# Patient Record
Sex: Female | Born: 1950 | Race: White | Hispanic: No | Marital: Married
Health system: Midwestern US, Academic
[De-identification: ages and names within clinical notes are randomized; demographics above are authoritative.]

## PROBLEM LIST (undated history)

## (undated) DIAGNOSIS — H9190 Unspecified hearing loss, unspecified ear: Secondary | ICD-10-CM

## (undated) DIAGNOSIS — C4491 Basal cell carcinoma of skin, unspecified: Secondary | ICD-10-CM

## (undated) DIAGNOSIS — Z87898 Personal history of other specified conditions: Secondary | ICD-10-CM

## (undated) DIAGNOSIS — I10 Essential (primary) hypertension: Secondary | ICD-10-CM

## (undated) DIAGNOSIS — J342 Deviated nasal septum: Secondary | ICD-10-CM

## (undated) DIAGNOSIS — K219 Gastro-esophageal reflux disease without esophagitis: Secondary | ICD-10-CM

## (undated) DIAGNOSIS — I2510 Atherosclerotic heart disease of native coronary artery without angina pectoris: Secondary | ICD-10-CM

## (undated) DIAGNOSIS — M4800 Spinal stenosis, site unspecified: Secondary | ICD-10-CM

## (undated) DIAGNOSIS — M1990 Unspecified osteoarthritis, unspecified site: Secondary | ICD-10-CM

## (undated) DIAGNOSIS — H9312 Tinnitus, left ear: Secondary | ICD-10-CM

## (undated) DIAGNOSIS — Z9289 Personal history of other medical treatment: Secondary | ICD-10-CM

## (undated) DIAGNOSIS — K589 Irritable bowel syndrome without diarrhea: Secondary | ICD-10-CM

## (undated) DIAGNOSIS — H905 Unspecified sensorineural hearing loss: Secondary | ICD-10-CM

## (undated) DIAGNOSIS — L309 Dermatitis, unspecified: Secondary | ICD-10-CM

## (undated) DIAGNOSIS — M519 Unspecified thoracic, thoracolumbar and lumbosacral intervertebral disc disorder: Secondary | ICD-10-CM

## (undated) DIAGNOSIS — E785 Hyperlipidemia, unspecified: Secondary | ICD-10-CM

## (undated) DIAGNOSIS — R931 Abnormal findings on diagnostic imaging of heart and coronary circulation: Secondary | ICD-10-CM

## (undated) DIAGNOSIS — M8580 Other specified disorders of bone density and structure, unspecified site: Secondary | ICD-10-CM

## (undated) DIAGNOSIS — H269 Unspecified cataract: Secondary | ICD-10-CM

## (undated) DIAGNOSIS — E7800 Pure hypercholesterolemia, unspecified: Secondary | ICD-10-CM

## (undated) DIAGNOSIS — D509 Iron deficiency anemia, unspecified: Secondary | ICD-10-CM

## (undated) DIAGNOSIS — Z79899 Other long term (current) drug therapy: Secondary | ICD-10-CM

## (undated) DIAGNOSIS — E559 Vitamin D deficiency, unspecified: Secondary | ICD-10-CM

## (undated) HISTORY — DX: Unspecified osteoarthritis, unspecified site: M19.90

## (undated) HISTORY — PX: ENDOMETRIAL BIOPSY: SHX622

## (undated) HISTORY — PX: EYE SURGERY: SHX253

## (undated) HISTORY — DX: Personal history of other medical treatment: Z92.89

## (undated) HISTORY — DX: Gastro-esophageal reflux disease without esophagitis: K21.9

## (undated) HISTORY — DX: Personal history of other specified conditions: Z87.898

## (undated) HISTORY — DX: Other specified disorders of bone density and structure, unspecified site: M85.80

## (undated) HISTORY — DX: Unspecified cataract: H26.9

## (undated) HISTORY — DX: Basal cell carcinoma of skin, unspecified: C44.91

## (undated) HISTORY — DX: Unspecified hearing loss, unspecified ear: H91.90

## (undated) HISTORY — PX: MOHS SURGERY: SUR867

## (undated) HISTORY — DX: Other long term (current) drug therapy: Z79.899

## (undated) HISTORY — DX: Hyperlipidemia, unspecified: E78.5

## (undated) HISTORY — DX: Tinnitus, left ear: H93.12

## (undated) HISTORY — DX: Deviated nasal septum: J34.2

## (undated) HISTORY — DX: Pure hypercholesterolemia, unspecified: E78.00

## (undated) HISTORY — DX: Dermatitis, unspecified: L30.9

## (undated) HISTORY — DX: Unspecified thoracic, thoracolumbar and lumbosacral intervertebral disc disorder: M51.9

## (undated) HISTORY — DX: Unspecified sensorineural hearing loss: H90.5

## (undated) HISTORY — DX: Spinal stenosis, site unspecified: M48.00

## (undated) HISTORY — DX: Abnormal findings on diagnostic imaging of heart and coronary circulation: R93.1

## (undated) HISTORY — DX: Atherosclerotic heart disease of native coronary artery without angina pectoris: I25.10

## (undated) HISTORY — DX: Irritable bowel syndrome, unspecified: K58.9

## (undated) HISTORY — DX: Essential (primary) hypertension: I10

## (undated) HISTORY — DX: Vitamin D deficiency, unspecified: E55.9

## (undated) HISTORY — DX: Iron deficiency anemia, unspecified: D50.9

## (undated) HISTORY — PX: TONSILLECTOMY: SUR1361

---

## 2013-01-02 LAB — HM COLONOSCOPY

## 2013-11-04 DIAGNOSIS — Z9289 Personal history of other medical treatment: Secondary | ICD-10-CM

## 2013-11-04 HISTORY — DX: Personal history of other medical treatment: Z92.89

## 2016-11-22 LAB — COMPREHENSIVE METABOLIC PANEL
Albumin: 4 (ref 3.5–5.0)
Calcium: 9.3 (ref 8.7–10.7)
GFR calc Af Amer: 86
GFR calc non Af Amer: 72

## 2016-11-22 LAB — BASIC METABOLIC PANEL
BUN: 19 (ref 4–21)
CO2: 27 — AB (ref 13–22)
Chloride: 105 (ref 99–108)
Creatinine: 0.8 (ref 0.5–1.1)
Glucose: 86
Potassium: 4.3 (ref 3.4–5.3)

## 2016-11-22 LAB — IRON,TIBC AND FERRITIN PANEL
Ferritin: 46
Iron: 94

## 2016-11-22 LAB — TSH: TSH: 2.04 (ref 0.41–5.90)

## 2016-11-22 LAB — CBC AND DIFFERENTIAL
HCT: 42 (ref 36–46)
Hemoglobin: 13.7 (ref 12.0–16.0)
Platelets: 245 (ref 150–399)
WBC: 5.1

## 2016-11-22 LAB — CBC: RBC: 4.7 (ref 3.87–5.11)

## 2016-11-22 LAB — HEMOGLOBIN A1C: Hemoglobin A1C: 5.3

## 2016-11-22 LAB — HEPATIC FUNCTION PANEL
Alkaline Phosphatase: 68 (ref 25–125)
Bilirubin, Total: 0.7

## 2016-11-22 LAB — LIPID PANEL
Cholesterol: 253 — AB (ref 0–200)
HDL: 169 — AB (ref 35–70)
LDL Cholesterol: 77
Triglycerides: 37 — AB (ref 40–160)

## 2017-09-30 ENCOUNTER — Ambulatory Visit: Admit: 2017-09-30 | Discharge: 2017-10-01 | Payer: MEDICARE

## 2017-09-30 ENCOUNTER — Encounter: Admit: 2017-09-30 | Discharge: 2017-09-30 | Payer: MEDICARE

## 2017-09-30 DIAGNOSIS — C801 Malignant (primary) neoplasm, unspecified: ICD-10-CM

## 2017-09-30 DIAGNOSIS — L309 Dermatitis, unspecified: ICD-10-CM

## 2017-09-30 DIAGNOSIS — E7800 Pure hypercholesterolemia, unspecified: ICD-10-CM

## 2017-09-30 DIAGNOSIS — H04123 Dry eye syndrome of bilateral lacrimal glands: ICD-10-CM

## 2017-09-30 DIAGNOSIS — L853 Xerosis cutis: ICD-10-CM

## 2017-09-30 DIAGNOSIS — H509 Unspecified strabismus: ICD-10-CM

## 2017-09-30 DIAGNOSIS — H269 Unspecified cataract: ICD-10-CM

## 2017-09-30 DIAGNOSIS — H2513 Age-related nuclear cataract, bilateral: ICD-10-CM

## 2017-09-30 DIAGNOSIS — H04129 Dry eye syndrome of unspecified lacrimal gland: ICD-10-CM

## 2017-09-30 DIAGNOSIS — R42 Dizziness and giddiness: Principal | ICD-10-CM

## 2017-09-30 DIAGNOSIS — H35371 Puckering of macula, right eye: Principal | ICD-10-CM

## 2017-09-30 NOTE — Progress Notes
Stacey Drake is a 66 y.o. female that had concerns including Follow Up (6 month 03/10/2017 follow up for dry eye and tear film insufficiency of both eyes.); Medication Update (Systane bid if I remember.); Vision Change (She notes no changes in her vision in either e ye.); Spots and/or Floaters (She notes her floaters are unchanged except one day where "her house was invaded by bugs"); and No Complaint (She confirms that she has no redness pain itching discharge flashing lights or light sensitivity in either eye.).      HPI, Assessment and Plan: The primary encounter diagnosis was Epiretinal membrane (ERM) of right eye. Diagnoses of Nuclear sclerosis of both eyes and Dry eye syndrome of both eyes were also pertinent to this visit. Doing well she feels va about same SD-OCT, ERM od stable, Having some problem w more spot, floaters but comes and goes, husband having problem w macular pucker, [facility].  Nuclear sclerosis slwoly worsening but va stable cont to monitor reck 6 mo.

## 2018-01-29 LAB — LIPID PANEL
Cholesterol: 241 — AB (ref 0–200)
HDL: 152 — AB (ref 35–70)
LDL Cholesterol: 79
Triglycerides: 52 (ref 40–160)

## 2018-01-29 LAB — HEPATIC FUNCTION PANEL
Alkaline Phosphatase: 69 (ref 25–125)
Bilirubin, Total: 0.4

## 2018-01-29 LAB — BASIC METABOLIC PANEL
BUN: 16 (ref 4–21)
CO2: 28 — AB (ref 13–22)
Chloride: 105 (ref 99–108)
Creatinine: 0.7 (ref 0.5–1.1)
Glucose: 91
Potassium: 4.3 (ref 3.4–5.3)
Sodium: 140 (ref 137–147)

## 2018-01-29 LAB — CBC AND DIFFERENTIAL
HCT: 41 (ref 36–46)
Hemoglobin: 13.5 (ref 12.0–16.0)
Platelets: 227 (ref 150–399)
WBC: 5.4

## 2018-01-29 LAB — VITAMIN B12: Vitamin B-12: 377

## 2018-01-29 LAB — TSH: TSH: 2 (ref 0.41–5.90)

## 2018-01-29 LAB — IRON,TIBC AND FERRITIN PANEL
Ferritin: 44
Iron: 80

## 2018-01-29 LAB — COMPREHENSIVE METABOLIC PANEL
Albumin: 3.8 (ref 3.5–5.0)
Calcium: 9.5 (ref 8.7–10.7)
GFR calc Af Amer: 100
GFR calc non Af Amer: 83

## 2018-01-29 LAB — CBC: RBC: 4.6 (ref 3.87–5.11)

## 2018-01-29 LAB — HEMOGLOBIN A1C: Hemoglobin A1C: 5.3

## 2018-01-29 LAB — VITAMIN D 25 HYDROXY (VIT D DEFICIENCY, FRACTURES): Vit D, 25-Hydroxy: 24

## 2018-04-02 ENCOUNTER — Encounter: Admit: 2018-04-02 | Discharge: 2018-04-02 | Payer: MEDICARE

## 2018-04-02 ENCOUNTER — Ambulatory Visit: Admit: 2018-04-02 | Discharge: 2018-04-02 | Payer: MEDICARE

## 2018-04-02 DIAGNOSIS — H2513 Age-related nuclear cataract, bilateral: ICD-10-CM

## 2018-04-02 DIAGNOSIS — E7800 Pure hypercholesterolemia, unspecified: ICD-10-CM

## 2018-04-02 DIAGNOSIS — C801 Malignant (primary) neoplasm, unspecified: ICD-10-CM

## 2018-04-02 DIAGNOSIS — R42 Dizziness and giddiness: Principal | ICD-10-CM

## 2018-04-02 DIAGNOSIS — H35371 Puckering of macula, right eye: Principal | ICD-10-CM

## 2018-04-02 DIAGNOSIS — H04123 Dry eye syndrome of bilateral lacrimal glands: ICD-10-CM

## 2018-04-02 DIAGNOSIS — L853 Xerosis cutis: ICD-10-CM

## 2018-04-02 DIAGNOSIS — H04129 Dry eye syndrome of unspecified lacrimal gland: ICD-10-CM

## 2018-04-02 DIAGNOSIS — H509 Unspecified strabismus: ICD-10-CM

## 2018-04-02 DIAGNOSIS — H269 Unspecified cataract: ICD-10-CM

## 2018-04-02 DIAGNOSIS — L309 Dermatitis, unspecified: ICD-10-CM

## 2018-04-02 DIAGNOSIS — M25641 Stiffness of right hand, not elsewhere classified: ICD-10-CM

## 2018-11-03 ENCOUNTER — Encounter: Admit: 2018-11-03 | Discharge: 2018-11-03 | Payer: MEDICARE

## 2018-11-03 ENCOUNTER — Ambulatory Visit: Admit: 2018-11-03 | Discharge: 2018-11-04 | Payer: MEDICARE

## 2018-11-03 DIAGNOSIS — H509 Unspecified strabismus: ICD-10-CM

## 2018-11-03 DIAGNOSIS — E7800 Pure hypercholesterolemia, unspecified: ICD-10-CM

## 2018-11-03 DIAGNOSIS — H04129 Dry eye syndrome of unspecified lacrimal gland: ICD-10-CM

## 2018-11-03 DIAGNOSIS — H269 Unspecified cataract: ICD-10-CM

## 2018-11-03 DIAGNOSIS — M25641 Stiffness of right hand, not elsewhere classified: ICD-10-CM

## 2018-11-03 DIAGNOSIS — C801 Malignant (primary) neoplasm, unspecified: ICD-10-CM

## 2018-11-03 DIAGNOSIS — R42 Dizziness and giddiness: Principal | ICD-10-CM

## 2018-11-03 DIAGNOSIS — L853 Xerosis cutis: ICD-10-CM

## 2018-11-03 DIAGNOSIS — L309 Dermatitis, unspecified: ICD-10-CM

## 2018-11-04 DIAGNOSIS — H35371 Puckering of macula, right eye: Principal | ICD-10-CM

## 2018-12-31 ENCOUNTER — Encounter: Admit: 2018-12-31 | Discharge: 2018-12-31 | Payer: MEDICARE

## 2019-06-08 LAB — CBC: RBC: 4.55 (ref 3.87–5.11)

## 2019-06-08 LAB — LIPID PANEL
Cholesterol: 213 — AB (ref 0–200)
HDL: 140 — AB (ref 35–70)
LDL Cholesterol: 61
Triglycerides: 59 (ref 40–160)

## 2019-06-08 LAB — IRON,TIBC AND FERRITIN PANEL: Ferritin: 90

## 2019-06-08 LAB — HEMOGLOBIN A1C: Hemoglobin A1C: 5.1

## 2019-06-08 LAB — CBC AND DIFFERENTIAL
HCT: 40 (ref 36–46)
Hemoglobin: 13.4 (ref 12.0–16.0)
Platelets: 236 (ref 150–399)
WBC: 6

## 2019-06-08 LAB — COMPREHENSIVE METABOLIC PANEL
Albumin: 4 (ref 3.5–5.0)
Calcium: 9.9 (ref 8.7–10.7)
GFR calc Af Amer: 85
GFR calc non Af Amer: 71

## 2019-06-08 LAB — HEPATIC FUNCTION PANEL
Alkaline Phosphatase: 84 (ref 25–125)
Bilirubin, Total: 0.8

## 2019-06-08 LAB — BASIC METABOLIC PANEL
BUN: 9 (ref 4–21)
CO2: 28 — AB (ref 13–22)
Chloride: 102 (ref 99–108)
Creatinine: 0.8 (ref 0.5–1.1)
Glucose: 112
Potassium: 4.3 (ref 3.4–5.3)
Sodium: 136 — AB (ref 137–147)

## 2019-06-08 LAB — TSH: TSH: 1.91 (ref <=5.90)

## 2019-06-08 LAB — VITAMIN B12: Vitamin B-12: 917

## 2019-11-27 ENCOUNTER — Encounter: Admit: 2019-11-27 | Discharge: 2019-11-27 | Payer: MEDICARE

## 2019-11-27 ENCOUNTER — Ambulatory Visit: Admit: 2019-11-27 | Discharge: 2019-11-28 | Payer: MEDICARE

## 2019-11-27 MED ORDER — COVID-19 VACC,MRNA(PFIZER)(PF) 30 MCG/0.3 ML IM SUSR
30 ug | Freq: Once | INTRAMUSCULAR | 0 refills | Status: CP
Start: 2019-11-27 — End: ?

## 2019-12-22 ENCOUNTER — Encounter: Admit: 2019-12-22 | Discharge: 2019-12-22 | Payer: MEDICARE

## 2019-12-22 ENCOUNTER — Ambulatory Visit: Admit: 2019-12-22 | Discharge: 2019-12-23 | Payer: MEDICARE

## 2019-12-22 MED ORDER — COVID-19 VACC,MRNA(PFIZER)(PF) 30 MCG/0.3 ML IM SUSR
30 ug | Freq: Once | INTRAMUSCULAR | 0 refills | Status: CP
Start: 2019-12-22 — End: ?

## 2020-01-13 ENCOUNTER — Encounter: Admit: 2020-01-13 | Discharge: 2020-01-13 | Payer: MEDICARE

## 2020-01-13 ENCOUNTER — Ambulatory Visit: Admit: 2020-01-13 | Discharge: 2020-01-13 | Payer: MEDICARE

## 2020-01-13 NOTE — Progress Notes
Stacey Drake is a 69 y.o. female that had concerns including Eye Problem (Pt reports no changes in vision as far as she can tell. Just gets occ blurred vision when she is reading ), Vision Change (Pt has been using a couple pair of glasses when she reads to find the best clarity.  ), and Treatment (Systane a few times a week ).      HPI, Assessment and Plan: The primary encounter diagnosis was Dry eye syndrome of both eyes. Diagnoses of Epiretinal membrane (ERM) of right eye and Nuclear sclerosis of both eyes were also pertinent to this visit. Doing well, cannot tell difference between last 2 pair of glasses,  No complaints. SSD-OCT improved and nuclear sclerosis slowly worsening so cont to monitr reck 9 mo

## 2020-09-15 ENCOUNTER — Other Ambulatory Visit: Payer: Self-pay

## 2020-09-15 ENCOUNTER — Encounter: Payer: Self-pay | Admitting: Orthopedic Surgery

## 2020-09-15 ENCOUNTER — Ambulatory Visit (INDEPENDENT_AMBULATORY_CARE_PROVIDER_SITE_OTHER): Payer: Medicare Other | Admitting: Orthopedic Surgery

## 2020-09-15 VITALS — BP 130/60 | HR 57 | Temp 97.3°F | Resp 20 | Ht 63.0 in | Wt 118.8 lb

## 2020-09-15 DIAGNOSIS — K2100 Gastro-esophageal reflux disease with esophagitis, without bleeding: Secondary | ICD-10-CM | POA: Diagnosis not present

## 2020-09-15 DIAGNOSIS — H9313 Tinnitus, bilateral: Secondary | ICD-10-CM | POA: Insufficient documentation

## 2020-09-15 DIAGNOSIS — Z124 Encounter for screening for malignant neoplasm of cervix: Secondary | ICD-10-CM

## 2020-09-15 DIAGNOSIS — Z85828 Personal history of other malignant neoplasm of skin: Secondary | ICD-10-CM

## 2020-09-15 DIAGNOSIS — Z1231 Encounter for screening mammogram for malignant neoplasm of breast: Secondary | ICD-10-CM

## 2020-09-15 DIAGNOSIS — M48061 Spinal stenosis, lumbar region without neurogenic claudication: Secondary | ICD-10-CM | POA: Diagnosis not present

## 2020-09-15 DIAGNOSIS — H259 Unspecified age-related cataract: Secondary | ICD-10-CM

## 2020-09-15 DIAGNOSIS — I10 Essential (primary) hypertension: Secondary | ICD-10-CM | POA: Diagnosis not present

## 2020-09-15 DIAGNOSIS — H9113 Presbycusis, bilateral: Secondary | ICD-10-CM | POA: Insufficient documentation

## 2020-09-15 DIAGNOSIS — H35373 Puckering of macula, bilateral: Secondary | ICD-10-CM | POA: Insufficient documentation

## 2020-09-15 DIAGNOSIS — E7800 Pure hypercholesterolemia, unspecified: Secondary | ICD-10-CM | POA: Insufficient documentation

## 2020-09-15 DIAGNOSIS — E2839 Other primary ovarian failure: Secondary | ICD-10-CM | POA: Insufficient documentation

## 2020-09-15 DIAGNOSIS — R55 Syncope and collapse: Secondary | ICD-10-CM

## 2020-09-15 NOTE — Progress Notes (Signed)
Careteam: No care team member to display  Seen by: Windell Moulding, AGNP-C  PLACE OF SERVICE:  [place] Directive information    No Known Allergies  No chief complaint on file.    HPI: Patient is a 69 y.o. female seen today to establish care with [facility].   She moved to [place] from [place] in July 2021 to be closer daughter and granddaughter. Adjusting to her new life here.Upset she is not driving as much and feels less independent. Scared of getting lost. Thinks roads in [place] are very confusing. Also expresses she misses her friends back in [place].    Dr. Palma Holter was her previous PCP. Last follow up was about 4 months prior.   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 Has not worked since 2002.   HTN- Does not know exactly when diagnoses was. Believes it was in her 16's. Takes lisinopril daily. Limits salt in her diet, does not add. Denies headaches or blurred vision. Does not check her blood pressure at home.   Has had high cholesterol for a very long time. Her mother had it as well. Takes atorvastatin daily. Avoids fried or fatty foods. Denies muscle aches and dark urine.   Spinal Stenosis- initially began in her 40's as bulging discs- was advised to lose weight and exercise. Ongoing back pain has subsided, but states her back sometimes feels stiff. Does not take medicine for pain. Uses icy/hot instead.   Neck pain- seen by Dr. Hedy Camara in [place] in 2020, she had increased neck pain without numbness or tingling radiating to her arms. She completed x-rays of thoracic and cervical spine, MRI of cervical was also done. Advised to start PT. Has since completed PT and pain has not returned. Denies daily neck pain at this time.   GERD at night- believes it started in 2015 due to stressors in her life. Takes pepcid daily at night. States Pepcid is effective. Does not have food triggers, but thinks her high coffe consumption  might be the cause.   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  Right hearing worse than left. Has been using bilateral hearing aids since 2001.  Tinnitus- began in her 99's. states it is chronic, but she has just blocked it out. Claims it becomes white noise in her head.   Fainting- she has had episodes of fainting in the past. Has been told it is caused by her vagal response. Thinks it is triggered by food and drink. Alcohol is a trigger. Has come close to fainting, but will lay flat and claims it will go away. No additional injuries have been caused. Has seen a cardiologist in the past and had a exercise stress test and echocardiogram completed in 2015.   Basal cell carcinoma- located on her face. Had Mohs surgery to remove cancer. Wears sunscreen if outside for long periods. Does not report any additional issues with her skin except dryness.   No history of recent falls, accidents or hospitalizations.   Appetite- drinks two large mugs daily in AM, and then another in the afternoon. Only drinks about 2 glasses of water daily. Lost 30 pounds in last year due to diet consisting of fish and vegetables.   Sleeps about 7 hrs a night.   Incontinence- not leaking urine, but c/o urinary frequency.  Urinates once a night.   Colonoscopy- Done around age 63. Claims it was a clean exam. No  family history of colon cancer.  Dexa scan- Claims she was osteopenic. Takes vitamin D1000 daily. Requesting to schedule new one.  Mamogram- aunt had bresat cancer. Still gets yearly mammograms. Does not perform monthly self exams. Denies any changes with her breasts. Requesting to schedule one.   Pap smear- wants gynecology referral.   Eye exam- Has a history of macular pucker in both eyes. Wears bifocal glasses. Still has trouble reading at times. Last seen by eye doc sometime in 2021. Trouble seeing at night- does not drive at night. Cataracts in both eyes.  Last dental exam- Last dental exam  in July 2021.         Review of Systems:  Review of Systems  Constitutional: Negative for weight loss.  HENT: Positive for hearing loss and tinnitus. Negative for sore throat.        Hearing aids  Eyes: Positive for blurred vision.       Glasses. Dry eyes.   Respiratory: Negative for shortness of breath and wheezing.   Cardiovascular: Negative for chest pain, palpitations and leg swelling.  Gastrointestinal: Positive for heartburn. Negative for abdominal pain, constipation, diarrhea and nausea.  Genitourinary: Positive for frequency. Negative for dysuria and hematuria.  Musculoskeletal: Positive for back pain.       Neck pain  Neurological: Negative for dizziness, weakness and headaches.       Fainting  Psychiatric/Behavioral: Positive for depression. The patient is nervous/anxious.     Past Medical History:  Diagnosis Date  . Acid reflux   . H/O bone density study 2019-2015  . H/O echocardiogram 2015  . H/O exercise stress test 3/4-5 2015  . Hearing loss   . High blood pressure   . High cholesterol   . Hx of mammogram 2018-2017-2016-2015-2014  . Spinal stenosis    Past Surgical History:  Procedure Laterality Date  . EYE SURGERY    . TONSILLECTOMY     Social History:   reports that she has never smoked. She has never used smokeless tobacco. She reports current alcohol use. She reports that she does not use drugs.  Family History  Problem Relation Age of Onset  . Stroke Mother 95    Medications: Patient's Medications  New Prescriptions   No medications on file  Previous Medications   ASPIRIN EC 81 MG TABLET    Take 81 mg by mouth every Monday, Wednesday, and Friday. Swallow whole.   ATORVASTATIN (LIPITOR) 20 MG TABLET    Take 20 mg by mouth daily.   CHOLECALCIFEROL (VITAMIN D) 25 MCG (1000 UNIT) TABLET    Take 1,000 Units by mouth as needed.   FAMOTIDINE (PEPCID) 20 MG TABLET    Take 20 mg by mouth daily.   LISINOPRIL (ZESTRIL) 10 MG TABLET    Take 10 mg by  mouth daily.   VITAMIN B-12 (CYANOCOBALAMIN) 1000 MCG TABLET    Take 1,000 mcg by mouth as needed.  Modified Medications   No medications on file  Discontinued Medications   No medications on file    Physical Exam:  There were no vitals filed for this visit. There is no height or weight on file to calculate BMI. Wt Readings from Last 3 Encounters:  No data found for Wt    Physical Exam Vitals reviewed.  Constitutional:      Appearance: Normal appearance. She is normal weight.  HENT:     Head: Normocephalic.     Right Ear: Tympanic membrane normal. There is no impacted cerumen.  Left Ear: Tympanic membrane normal. There is no impacted cerumen.     Mouth/Throat:     Mouth: Mucous membranes are moist.     Pharynx: No posterior oropharyngeal erythema.  Eyes:     Extraocular Movements: Extraocular movements intact.     Pupils: Pupils are equal, round, and reactive to light.  Neck:     Thyroid: No thyroid mass or thyromegaly.  Cardiovascular:     Rate and Rhythm: Normal rate and regular rhythm.     Pulses: Normal pulses.     Heart sounds: Normal heart sounds. No murmur heard.   Pulmonary:     Effort: Pulmonary effort is normal. No respiratory distress.     Breath sounds: Normal breath sounds.  Abdominal:     General: Abdomen is flat. Bowel sounds are normal.     Palpations: Abdomen is soft.  Musculoskeletal:        General: Normal range of motion.     Cervical back: Normal range of motion and neck supple.     Right lower leg: No edema.     Left lower leg: No edema.  Lymphadenopathy:     Cervical: No cervical adenopathy.  Skin:    General: Skin is warm and dry.     Capillary Refill: Capillary refill takes less than 2 seconds.  Neurological:     General: No focal deficit present.     Mental Status: She is alert and oriented to person, place, and time. Mental status is at baseline.  Psychiatric:        Mood and Affect: Mood normal.        Behavior: Behavior normal.         Thought Content: Thought content normal.        Judgment: Judgment normal.     Labs reviewed: Basic Metabolic Panel: No results for input(s): NA, K, CL, CO2, GLUCOSE, BUN, CREATININE, CALCIUM, MG, PHOS, TSH in the last 8760 hours. Liver Function Tests: No results for input(s): AST, ALT, ALKPHOS, BILITOT, PROT, ALBUMIN in the last 8760 hours. No results for input(s): LIPASE, AMYLASE in the last 8760 hours. No results for input(s): AMMONIA in the last 8760 hours. CBC: No results for input(s): WBC, NEUTROABS, HGB, HCT, MCV, PLT in the last 8760 hours. Lipid Panel: No results for input(s): CHOL, HDL, LDLCALC, TRIG, CHOLHDL, LDLDIRECT in the last 8760 hours. TSH: No results for input(s): TSH in the last 8760 hours. A1C: No results found for: HGBA1C   Assessment/Plan 1. Essential (primary) hypertension - stable, bp at goal <150/90 - continue current medication regimen - continue to limit salt in diet - cbc/diff- future - cmp- future - tsh- future  2. Pure hypercholesterolemia - stable with medication - lipid panel- future - hepatic panel- futre - continue to follow low fat diet that limits food high in fat and fried foods  3. Spinal stenosis 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 -stable at this time, denies back pain at this time - continue to use icy/hot as needed -recommend OTC tylenol for pain if needed  4. Gastroesophageal reflux disease with esophagitis without hemorrhage - stable with nightly Pepcid - suggest reducing high amounts of coffee in current diet and switching to water  5. Presbycusis of both ears - stable with use of bilateral hearing aids - may consider f/u with audiologist on future   6. Tinnitus of both ears - stable at this time, she has learned to tune it out - advised to contact PCP if it  starts to affect daily living or sleep, or causes depression  7. Estrogen deficiency - awaiting results from previous DEXA study -  recommended increasing vitamin D to 2000 units/daily - recommend adding calcium 600 mg daily since diet is limited in dairy - continue to walk daily - DG Bone Density; Future  8. Encounter for screening mammogram for malignant neoplasm of breast - awaiting results from previous mammogram - MM Digital Screening; Future  9. Pap smear for cervical cancer screening - Ambulatory referral to Gynecology  10. Age-related cataract of both eyes, unspecified age-related cataract type - stable at this time but beginning to have difficulty seeing at night - Ambulatory referral to Ophthalmology  11. Macular pucker, bilateral - stable at this time - Ambulatory referral to Ophthalmology  12. History of basal cell carcinoma - resolved, denies any skin issues except dryness at this time  13. 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

## 2020-09-15 NOTE — Patient Instructions (Signed)
Referral to gynecologist and opthalmologic  Referral to have mammogram and bone denisty

## 2020-09-18 ENCOUNTER — Encounter: Payer: Self-pay | Admitting: Nurse Practitioner

## 2020-09-19 LAB — MAGNESIUM
Magnesium: 2.2
Magnesium: 2.3

## 2020-09-26 ENCOUNTER — Other Ambulatory Visit: Payer: Self-pay

## 2020-09-26 DIAGNOSIS — E7800 Pure hypercholesterolemia, unspecified: Secondary | ICD-10-CM

## 2020-09-26 DIAGNOSIS — I10 Essential (primary) hypertension: Secondary | ICD-10-CM

## 2020-10-06 ENCOUNTER — Telehealth: Payer: Self-pay

## 2020-10-06 NOTE — Telephone Encounter (Signed)
I have been trying to get in contact with the patients last PCP to get information not included in her medical records I spoke with someone on 09/16/2020 and they transferred me to the patients PCP's nurses voice mail and she and I have been playing phone tag back and forth missing each other for about 2 days so I left her a message telling her the hours of our office and when I could be reached and I had also left her a detailed message for the one day I would be leaving early and that she could ask for [place] our offices team Lead who I told about this call I was expecting I asked her is she could take this call for me that day and I told her the information we needed if she would have called but she called and just left a message on my line instead

## 2020-10-10 ENCOUNTER — Other Ambulatory Visit: Payer: Medicare Other

## 2020-10-10 ENCOUNTER — Other Ambulatory Visit: Payer: Self-pay

## 2020-10-10 DIAGNOSIS — E7800 Pure hypercholesterolemia, unspecified: Secondary | ICD-10-CM

## 2020-10-10 DIAGNOSIS — I10 Essential (primary) hypertension: Secondary | ICD-10-CM

## 2020-10-11 LAB — CBC WITH DIFFERENTIAL/PLATELET
Absolute Monocytes: 238 cells/uL (ref 200–950)
Basophils Absolute: 22 cells/uL (ref 0–200)
Basophils Relative: 0.6 %
Eosinophils Absolute: 50 cells/uL (ref 15–500)
Eosinophils Relative: 1.4 %
HCT: 38.2 % (ref 35.0–45.0)
Hemoglobin: 12.8 g/dL (ref 11.7–15.5)
Lymphs Abs: 1120 cells/uL (ref 850–3900)
MCH: 29.6 pg (ref 27.0–33.0)
MCHC: 33.5 g/dL (ref 32.0–36.0)
MCV: 88.4 fL (ref 80.0–100.0)
MPV: 11 fL (ref 7.5–12.5)
Monocytes Relative: 6.6 %
Neutro Abs: 2171 cells/uL (ref 1500–7800)
Neutrophils Relative %: 60.3 %
Platelets: 221 10*3/uL (ref 140–400)
RBC: 4.32 10*6/uL (ref 3.80–5.10)
RDW: 12.9 % (ref 11.0–15.0)
Total Lymphocyte: 31.1 %
WBC: 3.6 10*3/uL — ABNORMAL LOW (ref 3.8–10.8)

## 2020-10-11 LAB — COMPLETE METABOLIC PANEL WITH GFR
AG Ratio: 2.3 (calc) (ref 1.0–2.5)
ALT: 22 U/L (ref 6–29)
AST: 16 U/L (ref 10–35)
Albumin: 4.3 g/dL (ref 3.6–5.1)
Alkaline phosphatase (APISO): 53 U/L (ref 37–153)
BUN: 17 mg/dL (ref 7–25)
CO2: 28 mmol/L (ref 20–32)
Calcium: 9 mg/dL (ref 8.6–10.4)
Chloride: 106 mmol/L (ref 98–110)
Creat: 0.82 mg/dL (ref 0.60–0.93)
GFR, Est African American: 84 mL/min/{1.73_m2} (ref >=60)
GFR, Est Non African American: 72 mL/min/{1.73_m2} (ref >=60)
Globulin: 1.9 g/dL (calc) (ref 1.9–3.7)
Glucose, Bld: 85 mg/dL (ref 65–99)
Potassium: 4.3 mmol/L (ref 3.5–5.3)
Sodium: 141 mmol/L (ref 135–146)
Total Bilirubin: 0.6 mg/dL (ref 0.2–1.2)
Total Protein: 6.2 g/dL (ref 6.1–8.1)

## 2020-10-11 LAB — HEPATIC FUNCTION PANEL
AG Ratio: 2.3 (calc) (ref 1.0–2.5)
ALT: 22 U/L (ref 6–29)
AST: 16 U/L (ref 10–35)
Albumin: 4.3 g/dL (ref 3.6–5.1)
Alkaline phosphatase (APISO): 53 U/L (ref 37–153)
Bilirubin, Direct: 0.1 mg/dL (ref 0.0–0.2)
Globulin: 1.9 g/dL (calc) (ref 1.9–3.7)
Indirect Bilirubin: 0.5 mg/dL (calc) (ref 0.2–1.2)
Total Bilirubin: 0.6 mg/dL (ref 0.2–1.2)
Total Protein: 6.2 g/dL (ref 6.1–8.1)

## 2020-10-11 LAB — LIPID PANEL
Cholesterol: 267 mg/dL — ABNORMAL HIGH (ref <200)
HDL: 137 mg/dL (ref >=50)
LDL Cholesterol (Calc): 118 mg/dL (calc) — ABNORMAL HIGH
Non-HDL Cholesterol (Calc): 130 mg/dL (calc) — ABNORMAL HIGH (ref <130)
Total CHOL/HDL Ratio: 1.9 (calc) (ref <5.0)
Triglycerides: 39 mg/dL (ref <150)

## 2020-10-11 LAB — TSH: TSH: 1.99 mIU/L (ref 0.40–4.50)

## 2020-10-13 ENCOUNTER — Ambulatory Visit (INDEPENDENT_AMBULATORY_CARE_PROVIDER_SITE_OTHER): Payer: Medicare Other | Admitting: Orthopedic Surgery

## 2020-10-13 ENCOUNTER — Encounter: Payer: Self-pay | Admitting: Orthopedic Surgery

## 2020-10-13 ENCOUNTER — Other Ambulatory Visit: Payer: Self-pay

## 2020-10-13 VITALS — BP 100/70 | HR 56 | Temp 97.0°F | Resp 20 | Ht 63.0 in | Wt 119.4 lb

## 2020-10-13 DIAGNOSIS — M25511 Pain in right shoulder: Secondary | ICD-10-CM

## 2020-10-13 DIAGNOSIS — K219 Gastro-esophageal reflux disease without esophagitis: Secondary | ICD-10-CM

## 2020-10-13 DIAGNOSIS — K2100 Gastro-esophageal reflux disease with esophagitis, without bleeding: Secondary | ICD-10-CM

## 2020-10-13 DIAGNOSIS — E7800 Pure hypercholesterolemia, unspecified: Secondary | ICD-10-CM

## 2020-10-13 DIAGNOSIS — Z1231 Encounter for screening mammogram for malignant neoplasm of breast: Secondary | ICD-10-CM

## 2020-10-13 DIAGNOSIS — I10 Essential (primary) hypertension: Secondary | ICD-10-CM | POA: Diagnosis not present

## 2020-10-13 MED ORDER — FAMOTIDINE 20 MG PO TABS: 20.0000 mg | ORAL_TABLET | Freq: Every day | ORAL | 1 refills | Status: DC

## 2020-10-13 MED ORDER — ATORVASTATIN CALCIUM 20 MG PO TABS: 20.0000 mg | ORAL_TABLET | Freq: Every day | ORAL | 1 refills | Status: DC

## 2020-10-13 MED ORDER — LISINOPRIL 10 MG PO TABS: 10.0000 mg | ORAL_TABLET | Freq: Every day | ORAL | 1 refills | Status: DC

## 2020-10-13 NOTE — Progress Notes (Signed)
Careteam: Patient Care Team: Yvonna Alanis, NP as PCP - General (Adult Health Nurse Practitioner)  Seen by: Windell Moulding, AGNP-C  PLACE OF SERVICE:  [place] Directive information    No Known Allergies  Chief Complaint  Patient presents with   Annual Exam    Annual Exam   Quality Metric Gaps    Hep-C Screening,    Immunizations    T-Dap     HPI: Patient is a 69 y.o. female seen today for physical exam and management of chronic conditions.   Lab results discussed with patient. Her cholesterol remians high on statin. She reduced her dose in half about a year ago. Claims her diet is rich in cheese and butter. Interested in dietary modifications before increasing statin dosage. Would like to come back to have lipid panel checked in three months after changing her diet.   10 years ago she had a cardioscan in [place]. She was recently notified by phone about scheduling the next one. Asking if she should receive another one.   Has a long history of neck and back pain. Recently saw a physical therapist for neck pain. She has not started exercises. Today, she states her neck is pain free, but she has random pain that shoots from her right shoulder to her elbow. Pain rated 5/10. Episodes of pain vary daily. She will sit in her hot tub at home to help with pain. Not taking any OTC medication for pain.   Denies any recent episodes of acid reflux. Still taking Pepcid daily.   She is still walking 3 miles with her husband daily.   No recent falls, injuries or hospitalizations.   She has not received a phone call to schedule her mammogram. She would like to speak with referral coordinator.   Requesting refills on medications.       Review of Systems:  Review of Systems  Constitutional: Negative for fever, malaise/fatigue and weight loss.  HENT: Positive for tinnitus. Negative for congestion and sore throat.        Bilateral hearing aids  Eyes: Negative for  photophobia.  Respiratory: Negative for cough, shortness of breath and wheezing.   Cardiovascular: Negative for chest pain and leg swelling.  Gastrointestinal: Positive for heartburn. Negative for abdominal pain, blood in stool and constipation.  Genitourinary: Negative for dysuria, frequency and hematuria.  Musculoskeletal: Positive for back pain, joint pain and neck pain.       Right arm pain  Skin:       Dry skin  Neurological: Positive for headaches. Negative for weakness.  Psychiatric/Behavioral: Negative for depression. The patient is not nervous/anxious and does not have insomnia.     Past Medical History:  Diagnosis Date   Acid reflux    Agatston CAC score 100-199    Coronary atherosclerosis    Deviated nasal septum    Eczema    Gastroesophageal reflux disease    H/O bone density study 2019-2015   H/O echocardiogram 2015   H/O exercise stress test 3/4-5 2015   Hearing loss    High blood pressure    High cholesterol    History of chest pain    History of prediabetes    Hx of mammogram 2018-2017-2016-2015-2014   Hyperlipidemia    IBS (irritable bowel syndrome)    Intervertebral disc disease    Iron deficiency anemia    On statin therapy    Osteoarthritis    Osteopenia    unspecified  Location   SNHL (sensorineural hearing loss)    Spinal stenosis    Tinnitus of left ear    Vitamin D deficiency    Past Surgical History:  Procedure Laterality Date   EYE SURGERY     TONSILLECTOMY     Social History:   reports that she has never smoked. She has never used smokeless tobacco. She reports current alcohol use. She reports that she does not use drugs.  Family History  Problem Relation Age of Onset   Stroke Mother 61    Medications: Patient's Medications  New Prescriptions   No medications on file  Previous Medications   ASPIRIN EC 81 MG TABLET    Take 81 mg by mouth every Monday, Wednesday, and Friday. Swallow whole.   ATORVASTATIN (LIPITOR) 20 MG TABLET     Take 20 mg by mouth daily.   CHOLECALCIFEROL (VITAMIN D) 25 MCG (1000 UNIT) TABLET    Take 1,000 Units by mouth as needed.   FAMOTIDINE (PEPCID) 20 MG TABLET    Take 20 mg by mouth daily.   LISINOPRIL (ZESTRIL) 10 MG TABLET    Take 10 mg by mouth daily.   VITAMIN B-12 (CYANOCOBALAMIN) 1000 MCG TABLET    Take 1,000 mcg by mouth as needed.  Modified Medications   No medications on file  Discontinued Medications   No medications on file    Physical Exam:  Vitals:   10/13/20 0858  BP: 100/70  Pulse: (!) 56  Resp: 20  Temp: (!) 97 F (36.1 C)  TempSrc: Temporal  SpO2: 99%  Weight: 119 lb 6.4 oz (54.2 kg)  Height: 5\' 3"  (1.6 m)   Body mass index is 21.15 kg/m. Wt Readings from Last 3 Encounters:  10/13/20 119 lb 6.4 oz (54.2 kg)  09/15/20 118 lb 12.8 oz (53.9 kg)    Physical Exam Vitals reviewed.  Constitutional:      General: She is not in acute distress.    Appearance: Normal appearance. She is normal weight. She is not ill-appearing.  HENT:     Head: Normocephalic.     Nose: Nose normal.     Mouth/Throat:     Mouth: Mucous membranes are moist.     Pharynx: No posterior oropharyngeal erythema.  Eyes:     General:        Right eye: No discharge.        Left eye: No discharge.     Extraocular Movements: Extraocular movements intact.     Pupils: Pupils are equal, round, and reactive to light.  Neck:     Thyroid: No thyroid mass, thyromegaly or thyroid tenderness.  Cardiovascular:     Rate and Rhythm: Normal rate and regular rhythm.     Pulses: Normal pulses.     Heart sounds: Normal heart sounds. No murmur heard.   Pulmonary:     Effort: Pulmonary effort is normal. No respiratory distress.     Breath sounds: Normal breath sounds. No wheezing.  Chest:  Breasts:     Right: Normal. No mass, skin change or tenderness.     Left: Normal. No mass, skin change or tenderness.    Abdominal:     General: Abdomen is flat. Bowel sounds are normal. There is no  distension.     Palpations: Abdomen is soft.     Tenderness: There is no abdominal tenderness.  Musculoskeletal:     Right shoulder: No swelling. Normal range of motion. Normal strength.     Left shoulder:  No swelling. Normal range of motion. Normal strength.     Cervical back: Normal range of motion. No swelling, rigidity or tenderness. Normal range of motion.     Thoracic back: No swelling or tenderness.     Lumbar back: No swelling or tenderness.     Right lower leg: No edema.     Left lower leg: No edema.  Lymphadenopathy:     Cervical: No cervical adenopathy.  Skin:    General: Skin is warm and dry.     Capillary Refill: Capillary refill takes less than 2 seconds.  Neurological:     General: No focal deficit present.     Mental Status: She is alert and oriented to person, place, and time. Mental status is at baseline.     Sensory: Sensation is intact.     Coordination: Coordination is intact.     Gait: Gait is intact.     Deep Tendon Reflexes: Reflexes are normal and symmetric.  Psychiatric:        Mood and Affect: Mood normal.        Behavior: Behavior normal.        Thought Content: Thought content normal.        Judgment: Judgment normal.     Labs reviewed: Basic Metabolic Panel: Recent Labs    10/10/20 0843  NA 141  K 4.3  CL 106  CO2 28  GLUCOSE 85  BUN 17  CREATININE 0.82  CALCIUM 9.0  TSH 1.99   Liver Function Tests: Recent Labs    10/10/20 0843  AST 16  16  ALT 22  22  BILITOT 0.6  0.6  PROT 6.2  6.2   No results for input(s): LIPASE, AMYLASE in the last 8760 hours. No results for input(s): AMMONIA in the last 8760 hours. CBC: Recent Labs    10/10/20 0843  WBC 3.6*  NEUTROABS 2,171  HGB 12.8  HCT 38.2  MCV 88.4  PLT 221   Lipid Panel: Recent Labs    10/10/20 0843  CHOL 267*  HDL 137  LDLCALC 118*  TRIG 39  CHOLHDL 1.9   TSH: Recent Labs    10/10/20 0843  TSH 1.99   A1C: Lab Results  Component Value Date   HGBA1C  5.1 06/08/2019     Assessment/Plan 1. Essential (primary) hypertension - EKG 12-Lead - sinus bradycardia, no other abnormalities - stable, bp at goal - continue lisinopril 20 mg daily - continue to limit sodium in diet, <2000 mg/day  2. Pure hypercholesterolemia - LDL 118, goal <100 - she would like to try dietary modifications before increasing statin - educated about foods high in cholesterol - will recheck lipid panel in 3 months - continue statin regimen - Lipid Panel; Future  3. Gastroesophageal reflux disease without esophagitis - stable on medication - continue Pepcid regimen - famotidine (PEPCID) 20 MG tablet; Take 1 tablet (20 mg total) by mouth daily.  Dispense: 90 tablet; Refill: 1  4. Encounter for screening mammogram for malignant neoplasm of breast - orders placed for mammogram - will have her speak with referral coordinator   5. Acute pain of right shoulder - suspect pain is related to neck issues - no subscapularis pain, FROM of extremity - advise starting exercises from PT - recommend purchasing cervical pillow - recommend tylenol 1000 mg BID for pain - continue using hot tub for pain relief - may consider massage therapy - advised patient to contact PCP is pain does not resolve after 4  weeks with PT   Encounter time: 46 minutes Face to face encounter  Next appt: Visit date not found [place] [phone]

## 2020-10-13 NOTE — Patient Instructions (Addendum)
Advise restarting PT exercises for neck pain. Try a new cervical pillow. Can take tylenol 1000 mg twice daily pain.     Health Maintenance, Female Adopting a healthy lifestyle and getting preventive care are important in promoting health and wellness. Ask your health care provider about:  The right schedule for you to have regular tests and exams.  Things you can do on your own to prevent diseases and keep yourself healthy. What should I know about diet, weight, and exercise? Eat a healthy diet   Eat a diet that includes plenty of vegetables, fruits, low-fat dairy products, and lean protein.  Do not eat a lot of foods that are high in solid fats, added sugars, or sodium. Maintain a healthy weight Body mass index (BMI) is used to identify weight problems. It estimates body fat based on height and weight. Your health care provider can help determine your BMI and help you achieve or maintain a healthy weight. Get regular exercise Get regular exercise. This is one of the most important things you can do for your health. Most adults should:  Exercise for at least 150 minutes each week. The exercise should increase your heart rate and make you sweat (moderate-intensity exercise).  Do strengthening exercises at least twice a week. This is in addition to the moderate-intensity exercise.  Spend less time sitting. Even light physical activity can be beneficial. Watch cholesterol and blood lipids Have your blood tested for lipids and cholesterol at 70 years of age, then have this test every 5 years. Have your cholesterol levels checked more often if:  Your lipid or cholesterol levels are high.  You are older than 70 years of age.  You are at high risk for heart disease. What should I know about cancer screening? Depending on your health history and family history, you may need to have cancer screening at various ages. This may include screening for:  Breast cancer.  Cervical  cancer.  Colorectal cancer.  Skin cancer.  Lung cancer. What should I know about heart disease, diabetes, and high blood pressure? Blood pressure and heart disease  High blood pressure causes heart disease and increases the risk of stroke. This is more likely to develop in people who have high blood pressure readings, are of African descent, or are overweight.  Have your blood pressure checked: ? Every 3-5 years if you are 70-75 years of age. ? Every year if you are 70 years old or older. Diabetes Have regular diabetes screenings. This checks your fasting blood sugar level. Have the screening done:  Once every three years after age 8 if you are at a normal weight and have a low risk for diabetes.  More often and at a younger age if you are overweight or have a high risk for diabetes. What should I know about preventing infection? Hepatitis B If you have a higher risk for hepatitis B, you should be screened for this virus. Talk with your health care provider to find out if you are at risk for hepatitis B infection. Hepatitis C Testing is recommended for:  Everyone born from [birth year] through 1966.  Anyone with known risk factors for hepatitis C. Sexually transmitted infections (STIs)  Get screened for STIs, including gonorrhea and chlamydia, if: ? You are sexually active and are younger than 70 years of age. ? You are older than 70 years of age and your health care provider tells you that you are at risk for this type of infection. ? Your sexual  activity has changed since you were last screened, and you are at increased risk for chlamydia or gonorrhea. Ask your health care provider if you are at risk.  Ask your health care provider about whether you are at high risk for HIV. Your health care provider may recommend a prescription medicine to help prevent HIV infection. If you choose to take medicine to prevent HIV, you should first get tested for HIV. You should then be tested every 3  months for as long as you are taking the medicine. Pregnancy  If you are about to stop having your period (premenopausal) and you may become pregnant, seek counseling before you get pregnant.  Take 400 to 800 micrograms (mcg) of folic acid every day if you become pregnant.  Ask for birth control (contraception) if you want to prevent pregnancy. Osteoporosis and menopause Osteoporosis is a disease in which the bones lose minerals and strength with aging. This can result in bone fractures. If you are 30 years old or older, or if you are at risk for osteoporosis and fractures, ask your health care provider if you should:  Be screened for bone loss.  Take a calcium or vitamin D supplement to lower your risk of fractures.  Be given hormone replacement therapy (HRT) to treat symptoms of menopause. Follow these instructions at home: Lifestyle  Do not use any products that contain nicotine or tobacco, such as cigarettes, e-cigarettes, and chewing tobacco. If you need help quitting, ask your health care provider.  Do not use street drugs.  Do not share needles.  Ask your health care provider for help if you need support or information about quitting drugs. Alcohol use  Do not drink alcohol if: ? Your health care provider tells you not to drink. ? You are pregnant, may be pregnant, or are planning to become pregnant.  If you drink alcohol: ? Limit how much you use to 0-1 drink a day. ? Limit intake if you are breastfeeding.  Be aware of how much alcohol is in your drink. In the [place] one drink equals one 12 oz bottle of beer (355 mL), one 5 oz glass of wine (148 mL), or one 1 oz glass of hard liquor (44 mL). General instructions  Schedule regular health, dental, and eye exams.  Stay current with your vaccines.  Tell your health care provider if: ? You often feel depressed. ? You have ever been abused or do not feel safe at home. Summary  Adopting a healthy lifestyle and getting  preventive care are important in promoting health and wellness.  Follow your health care provider's instructions about healthy diet, exercising, and getting tested or screened for diseases.  Follow your health care provider's instructions on monitoring your cholesterol and blood pressure. This information is not intended to replace advice given to you by your health care provider. Make sure you discuss any questions you have with your health care provider. Document Revised: 10/14/2018 Document Reviewed: 10/14/2018 Elsevier Patient Education  2020 [employer].

## 2020-10-16 ENCOUNTER — Ambulatory Visit: Payer: Medicare Other | Admitting: Nurse Practitioner

## 2020-10-17 ENCOUNTER — Other Ambulatory Visit (HOSPITAL_COMMUNITY)
Admission: RE | Admit: 2020-10-17 | Discharge: 2020-10-17 | Disposition: A | Discharge status: Home / Self Care | Payer: Medicare Other | Source: Ambulatory Visit | Attending: Obstetrics and Gynecology | Admitting: Obstetrics and Gynecology

## 2020-10-17 ENCOUNTER — Other Ambulatory Visit: Payer: Self-pay

## 2020-10-17 ENCOUNTER — Encounter: Payer: Self-pay | Admitting: Nurse Practitioner

## 2020-10-17 ENCOUNTER — Ambulatory Visit (INDEPENDENT_AMBULATORY_CARE_PROVIDER_SITE_OTHER): Payer: Medicare Other | Admitting: Nurse Practitioner

## 2020-10-17 VITALS — BP 118/68 | HR 70 | Resp 16 | Ht 60.5 in | Wt 119.0 lb

## 2020-10-17 DIAGNOSIS — Z01419 Encounter for gynecological examination (general) (routine) without abnormal findings: Secondary | ICD-10-CM | POA: Diagnosis present

## 2020-10-17 DIAGNOSIS — R319 Hematuria, unspecified: Secondary | ICD-10-CM

## 2020-10-17 DIAGNOSIS — N362 Urethral caruncle: Secondary | ICD-10-CM

## 2020-10-17 DIAGNOSIS — Z1272 Encounter for screening for malignant neoplasm of vagina: Secondary | ICD-10-CM | POA: Insufficient documentation

## 2020-10-17 DIAGNOSIS — Z1151 Encounter for screening for human papillomavirus (HPV): Secondary | ICD-10-CM | POA: Diagnosis not present

## 2020-10-17 LAB — POCT URINALYSIS DIPSTICK
Bilirubin, UA: NEGATIVE
Glucose, UA: NEGATIVE
Ketones, UA: NEGATIVE
Leukocytes, UA: NEGATIVE
Nitrite, UA: NEGATIVE
Protein, UA: NEGATIVE
Urobilinogen, UA: NEGATIVE E.U./dL — AB
pH, UA: 5 (ref 5.0–8.0)

## 2020-10-17 MED ORDER — ESTRADIOL 0.1 MG/GM VA CREA: TOPICAL_CREAM | VAGINAL | 6 refills | Status: DC

## 2020-10-17 NOTE — Progress Notes (Signed)
69 y.o. G61P2002 Married White or Caucasian female here for annual exam.     [facility] Reports frequent urination, about 1 time per hour after coffee and after that every 2 hours. Drinks 2 large mugs(24 oz)  coffee in am and another mug at 4 pm. Some stress incontinence, some urge incontinence. Started Kegel exercise and some benefit. Concerned because the last time she had a Dr. Appointment in [place], she was told there was microscopic blood. He told her not to worry, but wants to know more about it. Sister in law died of bladder cancer (not blood relative)  Sometimes has vaginal itching, not bothersome very often, not sexually active with husband Moved here from [place] Denies pelvic pain   No LMP recorded. Patient is postmenopausal.          Sexually active: No.  The current method of family planning is post menopausal status.    Exercising: Yes.    walking Smoker:  no  Health Maintenance: Pap:  07/2019 per patient (unsure about details) History of abnormal Pap:  no MMG:  07/2019, scheduled for 11/2020 Colonoscopy:  2014 neg per patient BMD:   Within last 2 yrs TDaP:  2010 unsure if updated since Gardasil:   n/a Covid-19: pfizer Pneumonia vaccine(s):  2018 Shingrix:   Within last 2 yrs per patient Hep C testing: unsure Screening Labs: With PCP   reports that she has never smoked. She has never used smokeless tobacco. She reports current alcohol use. She reports that she does not use drugs.  Past Medical History:  Diagnosis Date  . Acid reflux   . Agatston CAC score 100-199   . Basal cell carcinoma   . Coronary atherosclerosis   . Deviated nasal septum   . Eczema   . Gastroesophageal reflux disease   . H/O bone density study 2019-2015  . H/O echocardiogram 2015  . H/O exercise stress test 3/4-5 2015  . Hearing loss   . High blood pressure   . High cholesterol   . History of chest pain   . History of prediabetes   . Hx of mammogram  2018-2017-2016-2015-2014  . Hyperlipidemia   . IBS (irritable bowel syndrome)   . Intervertebral disc disease   . Iron deficiency anemia   . On statin therapy   . Osteoarthritis   . Osteopenia    unspecified  Location  . SNHL (sensorineural hearing loss)   . Spinal stenosis   . Tinnitus of left ear   . Vitamin D deficiency     Past Surgical History:  Procedure Laterality Date  . ENDOMETRIAL BIOPSY    . EYE SURGERY    . MOHS SURGERY     for basal cell to the right of right eye  . TONSILLECTOMY      Current Outpatient Medications  Medication Sig Dispense Refill  . Acetaminophen (TYLENOL EXTRA STRENGTH PO) Take by mouth.    Marland Kitchen aspirin EC 81 MG tablet Take 81 mg by mouth every Monday, Wednesday, and Friday. Swallow whole.    Marland Kitchen atorvastatin (LIPITOR) 20 MG tablet Take 1 tablet (20 mg total) by mouth daily. 90 tablet 1  . cholecalciferol (VITAMIN D) 25 MCG (1000 UNIT) tablet Take 1,000 Units by mouth as needed.    . famotidine (PEPCID) 20 MG tablet Take 1 tablet (20 mg total) by mouth daily. 90 tablet 1  . lisinopril (ZESTRIL) 10 MG tablet Take 1 tablet (10 mg total) by mouth daily. 90 tablet  1  . vitamin B-12 (CYANOCOBALAMIN) 1000 MCG tablet Take 1,000 mcg by mouth as needed.     No current facility-administered medications for this visit.    Family History  Problem Relation Age of Onset  . Stroke Mother 64  . Basal cell carcinoma Mother   . Hypertension Mother   . Basal cell carcinoma Father   . Hypertension Father   . Heart attack Paternal Grandfather     Review of Systems  Constitutional: Negative.   HENT: Negative.   Eyes: Negative.   Respiratory: Negative.   Cardiovascular: Negative.   Gastrointestinal: Negative.   Endocrine: Negative.   Genitourinary: Negative.   Musculoskeletal: Negative.   Skin: Negative.   Allergic/Immunologic: Negative.   Neurological: Negative.   Hematological: Negative.   Psychiatric/Behavioral: Negative.     Exam:   BP 118/68    Pulse 70   Resp 16   Ht 5' 0.5" (1.537 m)   Wt 119 lb (54 kg)   BMI 22.86 kg/m   Height: 5' 0.5" (153.7 cm)  General appearance: alert, cooperative and appears stated age Head: Normocephalic, without obvious abnormality, atraumatic Neck: no adenopathy, supple, symmetrical, trachea midline and thyroid normal to inspection and palpation Lungs: clear to auscultation bilaterally Breasts: normal appearance, no masses or tenderness Heart: regular rate and rhythm Abdomen: soft, non-tender; bowel sounds normal; no masses,  no organomegaly Extremities: extremities normal, atraumatic, no cyanosis or edema Skin: Skin color, texture, turgor normal. No rashes or lesions Lymph nodes: Cervical, supraclavicular, and axillary nodes normal. No abnormal inguinal nodes palpated Neurologic: Grossly normal   Pelvic: External genitalia:  no lesions              Urethra:  Caruncle noted, red              Bartholins and Skenes: normal                 Vagina: atrophy, smooth tissue, minimal discharge              Cervix: no cervical motion tenderness              Pap taken: Yes.   Bimanual Exam:  Uterus:  normal size, contour, position, consistency, mobility, non-tender              Adnexa: no mass, fullness, tenderness               Rectovaginal: Confirms               Anus:  normal sphincter tone, no lesions  Lovena Le, CMA Chaperone was present for exam.  A:  Well Woman with normal exam  Hematuria  Urethra caruncle  P:   Mammogram scheduled for January  pap smear co-testing collected today  Estrace vaginal estrogen apply directly to caruncle twice weekly  Urine culture sent  F/u 3 months, recheck urinalysis, recheck caruncle and discuss urinary symptoms  Attempt to get records regarding DEXA and pap

## 2020-10-17 NOTE — Patient Instructions (Signed)
Health Maintenance After Age 70 After age 70, you are at a higher risk for certain long-term diseases and infections as well as injuries from falls. Falls are a major cause of broken bones and head injuries in people who are older than age 70. Getting regular preventive care can help to keep you healthy and well. Preventive care includes getting regular testing and making lifestyle changes as recommended by your health care provider. Talk with your health care provider about:  Which screenings and tests you should have. A screening is a test that checks for a disease when you have no symptoms.  A diet and exercise plan that is right for you. What should I know about screenings and tests to prevent falls? Screening and testing are the best ways to find a health problem early. Early diagnosis and treatment give you the best chance of managing medical conditions that are common after age 70. Certain conditions and lifestyle choices may make you more likely to have a fall. Your health care provider may recommend:  Regular vision checks. Poor vision and conditions such as cataracts can make you more likely to have a fall. If you wear glasses, make sure to get your prescription updated if your vision changes.  Medicine review. Work with your health care provider to regularly review all of the medicines you are taking, including over-the-counter medicines. Ask your health care provider about any side effects that may make you more likely to have a fall. Tell your health care provider if any medicines that you take make you feel dizzy or sleepy.  Osteoporosis screening. Osteoporosis is a condition that causes the bones to get weaker. This can make the bones weak and cause them to break more easily.  Blood pressure screening. Blood pressure changes and medicines to control blood pressure can make you feel dizzy.  Strength and balance checks. Your health care provider may recommend certain tests to check your  strength and balance while standing, walking, or changing positions.  Foot health exam. Foot pain and numbness, as well as not wearing proper footwear, can make you more likely to have a fall.  Depression screening. You may be more likely to have a fall if you have a fear of falling, feel emotionally low, or feel unable to do activities that you used to do.  Alcohol use screening. Using too much alcohol can affect your balance and may make you more likely to have a fall. What actions can I take to lower my risk of falls? General instructions  Talk with your health care provider about your risks for falling. Tell your health care provider if: ? You fall. Be sure to tell your health care provider about all falls, even ones that seem minor. ? You feel dizzy, sleepy, or off-balance.  Take over-the-counter and prescription medicines only as told by your health care provider. These include any supplements.  Eat a healthy diet and maintain a healthy weight. A healthy diet includes low-fat dairy products, low-fat (lean) meats, and fiber from whole grains, beans, and lots of fruits and vegetables. Home safety  Remove any tripping hazards, such as rugs, cords, and clutter.  Install safety equipment such as grab bars in bathrooms and safety rails on stairs.  Keep rooms and walkways well-lit. Activity   Follow a regular exercise program to stay fit. This will help you maintain your balance. Ask your health care provider what types of exercise are appropriate for you.  If you need a cane or   walker, use it as recommended by your health care provider.  Wear supportive shoes that have nonskid soles. Lifestyle  Do not drink alcohol if your health care provider tells you not to drink.  If you drink alcohol, limit how much you have: ? 0-1 drink a day for women. ? 0-2 drinks a day for men.  Be aware of how much alcohol is in your drink. In the [place] one drink equals one typical bottle of beer (12  oz), one-half glass of wine (5 oz), or one shot of hard liquor (1 oz).  Do not use any products that contain nicotine or tobacco, such as cigarettes and e-cigarettes. If you need help quitting, ask your health care provider. Summary  Having a healthy lifestyle and getting preventive care can help to protect your health and wellness after age 70.  Screening and testing are the best way to find a health problem early and help you avoid having a fall. Early diagnosis and treatment give you the best chance for managing medical conditions that are more common for people who are older than age 70.  Falls are a major cause of broken bones and head injuries in people who are older than age 70. Take precautions to prevent a fall at home.  Work with your health care provider to learn what changes you can make to improve your health and wellness and to prevent falls. This information is not intended to replace advice given to you by your health care provider. Make sure you discuss any questions you have with your health care provider. Document Revised: 02/11/2019 Document Reviewed: 09/03/2017 Elsevier Patient Education  2020 [employer] Inc[employer]  

## 2020-10-18 LAB — CYTOLOGY - PAP
Comment: NEGATIVE
Diagnosis: NEGATIVE
High risk HPV: NEGATIVE

## 2020-10-20 LAB — URINE CULTURE

## 2020-11-27 ENCOUNTER — Ambulatory Visit
Admission: RE | Admit: 2020-11-27 | Discharge: 2020-11-27 | Disposition: A | Discharge status: Home / Self Care | Payer: Medicare Other | Source: Ambulatory Visit | Attending: Orthopedic Surgery | Admitting: Orthopedic Surgery

## 2020-11-27 ENCOUNTER — Other Ambulatory Visit: Payer: Self-pay

## 2020-11-27 DIAGNOSIS — Z1231 Encounter for screening mammogram for malignant neoplasm of breast: Secondary | ICD-10-CM

## 2021-01-11 NOTE — Progress Notes (Signed)
GYNECOLOGY  VISIT  CC:   hematuria  HPI: 70 y.o. G14P2002 Married White or Caucasian female here for 51mth follow up of urinalysis, caruncle.  Was very concerned at wellness exam appointment about microscopic blood in urine. Does have consistent urinary symptoms of frequency. During exam was found to have caruncle. Decided to treat with estrogen therapy first to see if beneficial. At that time blood in urine was was 1+ on urinalysis.   Used estrogen x 2 months, stopped, did not like it. She was upset about the "Black Box warning" on label and it was messy. Does not have pain with urination but still has light burning and frequency. Has decreased caffeine and helps a little  GYNECOLOGIC HISTORY: No LMP recorded. Patient is postmenopausal. Contraception: post menopausal Menopausal hormone therapy: none  Patient Active Problem List   Diagnosis Date Noted  . Essential (primary) hypertension 09/15/2020  . Pure hypercholesterolemia 09/15/2020  . 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 Gastroesophageal reflux disease with esophagitis without hemorrhage 09/15/2020  . Presbycusis of both ears 09/15/2020  . Tinnitus of both ears 09/15/2020  . Estrogen deficiency 09/15/2020  . Age-related cataract of both eyes 09/15/2020  . Macular pucker, bilateral 09/15/2020    Past Medical History:  Diagnosis Date  . Acid reflux   . Agatston CAC score 100-199   . Basal cell carcinoma   . Coronary atherosclerosis   . Deviated nasal septum   . Eczema   . Gastroesophageal reflux disease   . H/O bone density study 2019-2015  . H/O echocardiogram 2015  . H/O exercise stress test 3/4-5 2015  . Hearing loss   . High blood pressure   . High cholesterol   . History of chest pain   . History of prediabetes   . Hx of mammogram 2018-2017-2016-2015-2014  . Hyperlipidemia   . IBS (irritable bowel syndrome)   . Intervertebral disc disease   . Iron deficiency anemia    . On statin therapy   . Osteoarthritis   . Osteopenia    unspecified  Location  . SNHL (sensorineural hearing loss)   . Spinal stenosis   . Tinnitus of left ear   . Vitamin D deficiency     Past Surgical History:  Procedure Laterality Date  . ENDOMETRIAL BIOPSY    . EYE SURGERY    . MOHS SURGERY     for basal cell to the right of right eye  . TONSILLECTOMY      MEDS:   Current Outpatient Medications on File Prior to Visit  Medication Sig Dispense Refill  . Acetaminophen (TYLENOL EXTRA STRENGTH PO) Take by mouth.    Marland Kitchen aspirin EC 81 MG tablet Take 81 mg by mouth every Monday, Wednesday, and Friday. Swallow whole.    Marland Kitchen atorvastatin (LIPITOR) 20 MG tablet Take 1 tablet (20 mg total) by mouth daily. 90 tablet 1  . cholecalciferol (VITAMIN D) 25 MCG (1000 UNIT) tablet Take 1,000 Units by mouth as needed.    . famotidine (PEPCID) 20 MG tablet Take 1 tablet (20 mg total) by mouth daily. 90 tablet 1  . lisinopril (ZESTRIL) 10 MG tablet Take 1 tablet (10 mg total) by mouth daily. 90 tablet 1  . vitamin B-12 (CYANOCOBALAMIN) 1000 MCG tablet Take 1,000 mcg by mouth as needed.    Marland Kitchen estradiol (ESTRACE) 0.1 MG/GM vaginal cream 1 gram vaginally twice weekly (Patient not taking: Reported on 01/15/2021) 42.5 g 6   No current facility-administered medications  on file prior to visit.    ALLERGIES: Patient has no known allergies.  Family History  Problem Relation Age of Onset  . Stroke Mother 61  . Basal cell carcinoma Mother   . Hypertension Mother   . Basal cell carcinoma Father   . Hypertension Father   . Heart attack Paternal Grandfather   . Breast cancer Paternal Aunt   . Breast cancer Cousin      Review of Systems  Constitutional: Negative.   HENT: Negative.   Eyes: Negative.   Respiratory: Negative.   Cardiovascular: Negative.   Gastrointestinal: Negative.   Endocrine: Negative.   Genitourinary: Positive for frequency.  Musculoskeletal: Negative.   Skin: Negative.    Allergic/Immunologic: Negative.   Neurological: Negative.   Hematological: Negative.   Psychiatric/Behavioral: Negative.     PHYSICAL EXAMINATION:    BP 112/68   Pulse 68   Resp 16   Wt 122 lb (55.3 kg)   BMI 23.43 kg/m     General appearance: alert, cooperative, no acute distress  Urinalysis 1+ blood, trace leukocytes  Pelvic: External genitalia:  Dark keratotic lesion noted near clitoris(left side), not seen at last visit. Recommended biopsy.              Urethra:  Caruncle persistent, much less pronounced, much less red              Consented for biopsy, While prepping the area with betadine, lesion fell off. Advised patient that unlikely cancer, do not have to send to pathology. Pt insisted to send to pathology to know what the lesion is.  Chaperone, Joy, CMA, was present for exam.  Assessment: Urinary frequency - Plan: Urinalysis,Complete w/RFL Culture  Hematuria, unspecified type  Vulval lesion - Plan: Surgical pathology( [facility]/ [facility])    Plan:  Refer to urology for evaluation of hematuria

## 2021-01-15 ENCOUNTER — Encounter: Payer: Self-pay | Admitting: Nurse Practitioner

## 2021-01-15 ENCOUNTER — Other Ambulatory Visit (HOSPITAL_COMMUNITY)
Admission: RE | Admit: 2021-01-15 | Discharge: 2021-01-15 | Disposition: A | Discharge status: Home / Self Care | Payer: Medicare Other | Source: Ambulatory Visit | Attending: Nurse Practitioner | Admitting: Nurse Practitioner

## 2021-01-15 ENCOUNTER — Ambulatory Visit (INDEPENDENT_AMBULATORY_CARE_PROVIDER_SITE_OTHER): Payer: Medicare Other | Admitting: Nurse Practitioner

## 2021-01-15 ENCOUNTER — Telehealth: Payer: Self-pay | Admitting: *Deleted

## 2021-01-15 ENCOUNTER — Other Ambulatory Visit: Payer: Self-pay

## 2021-01-15 VITALS — BP 112/68 | HR 68 | Resp 16 | Wt 122.0 lb

## 2021-01-15 DIAGNOSIS — R319 Hematuria, unspecified: Secondary | ICD-10-CM

## 2021-01-15 DIAGNOSIS — N9089 Other specified noninflammatory disorders of vulva and perineum: Secondary | ICD-10-CM | POA: Diagnosis present

## 2021-01-15 DIAGNOSIS — R350 Frequency of micturition: Secondary | ICD-10-CM | POA: Diagnosis not present

## 2021-01-15 NOTE — Telephone Encounter (Signed)
Karma Ganja, NP asked me to cancel referral for Uro-gyn and refer to regular urology. Patient scheduled on 02/01/21 @ 10:00am check in at 9:45am with Angus Palms NP patient informed with time and date. Office notes faxed to [phone]

## 2021-01-15 NOTE — Telephone Encounter (Signed)
-----   Message from Karma Ganja, NP sent at 01/15/2021 10:28 AM EDT ----- Can you send this patient for referral to urology for hematuria

## 2021-01-15 NOTE — Telephone Encounter (Signed)
Referral placed in epic for uro-gyn they will call to schedule.

## 2021-01-15 NOTE — Patient Instructions (Signed)

## 2021-01-17 LAB — URINALYSIS, COMPLETE W/RFL CULTURE
Bilirubin Urine: NEGATIVE
Glucose, UA: NEGATIVE
Hyaline Cast: NONE SEEN /LPF
Ketones, ur: NEGATIVE
Nitrites, Initial: NEGATIVE
Protein, ur: NEGATIVE
Specific Gravity, Urine: 1.01 (ref 1.001–1.03)
pH: 5.5 (ref 5.0–8.0)

## 2021-01-17 LAB — URINE CULTURE
MICRO NUMBER:: 11643374
SPECIMEN QUALITY:: ADEQUATE

## 2021-01-17 LAB — SURGICAL PATHOLOGY

## 2021-01-17 LAB — CULTURE INDICATED

## 2021-01-17 NOTE — Progress Notes (Signed)
Please notify this patient that the specimen we sent to pathology was benign. It was made up of a fibrous protein called keratin. There is nothing to worry about.

## 2021-01-19 ENCOUNTER — Other Ambulatory Visit: Payer: Medicare Other

## 2021-01-19 ENCOUNTER — Other Ambulatory Visit: Payer: Self-pay

## 2021-01-19 DIAGNOSIS — E7800 Pure hypercholesterolemia, unspecified: Secondary | ICD-10-CM

## 2021-01-20 LAB — LIPID PANEL
Cholesterol: 264 mg/dL — ABNORMAL HIGH (ref <200)
HDL: 132 mg/dL (ref >=50)
LDL Cholesterol (Calc): 117 mg/dL (calc) — ABNORMAL HIGH
Non-HDL Cholesterol (Calc): 132 mg/dL (calc) — ABNORMAL HIGH (ref <130)
Total CHOL/HDL Ratio: 2 (calc) (ref <5.0)
Triglycerides: 49 mg/dL (ref <150)

## 2021-02-07 DIAGNOSIS — R3129 Other microscopic hematuria: Secondary | ICD-10-CM | POA: Insufficient documentation

## 2021-02-19 DIAGNOSIS — N819 Female genital prolapse, unspecified: Secondary | ICD-10-CM | POA: Insufficient documentation

## 2021-02-19 DIAGNOSIS — N393 Stress incontinence (female) (male): Secondary | ICD-10-CM | POA: Insufficient documentation

## 2021-02-19 DIAGNOSIS — N362 Urethral caruncle: Secondary | ICD-10-CM | POA: Insufficient documentation

## 2021-02-22 ENCOUNTER — Ambulatory Visit (INDEPENDENT_AMBULATORY_CARE_PROVIDER_SITE_OTHER): Payer: Medicare Other | Admitting: Orthopedic Surgery

## 2021-02-22 ENCOUNTER — Encounter: Payer: Self-pay | Admitting: Orthopedic Surgery

## 2021-02-22 ENCOUNTER — Other Ambulatory Visit: Payer: Self-pay

## 2021-02-22 VITALS — BP 130/80 | HR 49 | Temp 96.9°F | Ht 60.5 in | Wt 124.6 lb

## 2021-02-22 DIAGNOSIS — R311 Benign essential microscopic hematuria: Secondary | ICD-10-CM | POA: Diagnosis not present

## 2021-02-22 DIAGNOSIS — IMO0001 Reserved for inherently not codable concepts without codable children: Secondary | ICD-10-CM

## 2021-02-22 DIAGNOSIS — Z7722 Contact with and (suspected) exposure to environmental tobacco smoke (acute) (chronic): Secondary | ICD-10-CM | POA: Diagnosis not present

## 2021-02-22 DIAGNOSIS — R911 Solitary pulmonary nodule: Secondary | ICD-10-CM | POA: Diagnosis not present

## 2021-02-22 DIAGNOSIS — N898 Other specified noninflammatory disorders of vagina: Secondary | ICD-10-CM | POA: Diagnosis not present

## 2021-02-22 NOTE — Patient Instructions (Addendum)
May try Replens for vaginal dryness- may purchase OTC  F/u CT scan of chest in 1 year for lung nodule  Atrophic Vaginitis  Atrophic vaginitis is when the lining of the vagina becomes dry and thin. This is most common in women who have stopped having their periods (are in menopause). It usually starts when a woman is 20 to 71 years old. What are the causes? This condition is caused by a drop in a female hormone (estrogen). What increases the risk? You are more likely to develop this condition if:  You take certain medicines.  You have had your ovaries taken out.  You are being treated for cancer.  You have given birth or are breastfeeding.  You are more than 71 years old.  You smoke. What are the signs or symptoms?  Pain during sex.  A feeling of pressure during sex.  Bleeding during sex.  Burning or itching in the vagina.  Burning pain when you pee (urinate).  Fluid coming from your vagina. Some people do not have symptoms. How is this treated?  Using a lubricant before sex.  Using a moisturizer in the vagina.  Using estrogen in the vagina. In some cases, you may not need treatment. Follow these instructions at home: Medicines  Take all medicines only as told by your doctor. This includes medicines for dryness.  Do not use herbal medicines unless your doctor says it is okay. General instructions  Talk with your doctor about treatment.  Do not douche.  Do not use scented: ? Sprays. ? Tampons. ? Soaps.  If sex hurts, try using lubricants right before you have sex. Contact a doctor if:  You have fluid coming from the vagina that is not like normal.  You have a bad smell coming from your vagina.  You have new symptoms.  Your symptoms do not get better when treated.  Your symptoms get worse. Summary  This condition happens when the lining of the vagina becomes dry and thin.  It is most common in women who no longer have periods.  Treatment  may include using medicines for dryness.  Call a doctor if your symptoms do not get better. This information is not intended to replace advice given to you by your health care provider. Make sure you discuss any questions you have with your health care provider. Document Revised: 04/20/2020 Document Reviewed: 04/20/2020 Elsevier Patient Education  [place].

## 2021-02-22 NOTE — Progress Notes (Signed)
Careteam: Patient Care Team: Yvonna Alanis, NP as PCP - General (Adult Health Nurse Practitioner) Karma Ganja, NP as Nurse Practitioner (Nurse Practitioner)  Seen by: Windell Moulding, AGNP-C  PLACE OF SERVICE:  [place] Directive information    No Known Allergies  Chief Complaint  Patient presents with  . Follow-up    Follow up to discuss CT scan results from urologist. Patient would like to discuss estradiol     HPI: Patient is a 70 y.o. female seen today to follow up on CT results.   She was referred to urology by gynecologist due to hematuria. CT abdomen ordered. 5 mm nodule in right lower lobe found. No exposure to second hand smoke as child. 8-9 years of intermittent pipe smoke exposure from husband. She is a non-smoker. Tried a few puffs of cigarette as a teenage and did not like it.  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 She denies shortness of breath, bloody sputum or cough.   Vaginal dryness ongoing. Tried estradiol cream for about 2 months then stopped. Concerned about risk of cancer from medication. Asking for other options to treat dryness.   Advised by urology to follow up in one year for hematuria. Suspected cause due to carbuncle found during cysto. She denies urinary symptoms today.     Review of Systems:  Review of Systems  Constitutional: Negative for chills, fever, malaise/fatigue and weight loss.  Respiratory: Negative for cough, hemoptysis, sputum production, shortness of breath and wheezing.   Cardiovascular: Negative for chest pain and leg swelling.  Genitourinary: Positive for hematuria. Negative for dysuria and frequency.       Vaginal dryness  Psychiatric/Behavioral: Negative for depression. The patient is not nervous/anxious.     Past Medical History:  Diagnosis Date  . Acid reflux   . Agatston CAC score 100-199   . Basal cell carcinoma   . Coronary atherosclerosis   . Deviated nasal septum   . Eczema   .  Gastroesophageal reflux disease   . H/O bone density study 2019-2015  . H/O echocardiogram 2015  . H/O exercise stress test 3/4-5 2015  . Hearing loss   . High blood pressure   . High cholesterol   . History of chest pain   . History of prediabetes   . Hx of mammogram 2018-2017-2016-2015-2014  . Hyperlipidemia   . IBS (irritable bowel syndrome)   . Intervertebral disc disease   . Iron deficiency anemia   . On statin therapy   . Osteoarthritis   . Osteopenia    unspecified  Location  . SNHL (sensorineural hearing loss)   . Spinal stenosis   . Tinnitus of left ear   . Vitamin D deficiency    Past Surgical History:  Procedure Laterality Date  . ENDOMETRIAL BIOPSY    . EYE SURGERY    . MOHS SURGERY     for basal cell to the right of right eye  . TONSILLECTOMY     Social History:   reports that she has never smoked. She has never used smokeless tobacco. She reports current alcohol use. She reports that she does not use drugs.  Family History  Problem Relation Age of Onset  . Stroke Mother 38  . Basal cell carcinoma Mother   . Hypertension Mother   . Basal cell carcinoma Father   . Hypertension Father   . Heart attack Paternal Grandfather   . Breast cancer Paternal Aunt   . Breast  cancer Cousin     Medications: Patient's Medications  New Prescriptions   No medications on file  Previous Medications   ACETAMINOPHEN (TYLENOL EXTRA STRENGTH PO)    Take by mouth as needed.   ASPIRIN EC 81 MG TABLET    Take 81 mg by mouth every Monday, Wednesday, and Friday. Swallow whole.   ATORVASTATIN (LIPITOR) 20 MG TABLET    Take 1 tablet (20 mg total) by mouth daily.   CHOLECALCIFEROL (VITAMIN D) 25 MCG (1000 UNIT) TABLET    Take 1,000 Units by mouth as needed.   ESTRADIOL (ESTRACE) 0.1 MG/GM VAGINAL CREAM    1 gram vaginally twice weekly   FAMOTIDINE (PEPCID) 20 MG TABLET    Take 1 tablet (20 mg total) by mouth daily.   LISINOPRIL (ZESTRIL) 10 MG TABLET    Take 1 tablet (10 mg  total) by mouth daily.   VITAMIN B-12 (CYANOCOBALAMIN) 1000 MCG TABLET    Take 1,000 mcg by mouth as needed.  Modified Medications   No medications on file  Discontinued Medications   No medications on file    Physical Exam:  Vitals:   02/22/21 0940  BP: 130/80  Pulse: (!) 49  Temp: (!) 96.9 F (36.1 C)  SpO2: 98%  Weight: 124 lb 9.6 oz (56.5 kg)  Height: 5' 0.5" (1.537 m)   Body mass index is 23.93 kg/m. Wt Readings from Last 3 Encounters:  02/22/21 124 lb 9.6 oz (56.5 kg)  01/15/21 122 lb (55.3 kg)  10/17/20 119 lb (54 kg)    Physical Exam Vitals reviewed.  Constitutional:      General: She is not in acute distress. HENT:     Head: Normocephalic.  Cardiovascular:     Rate and Rhythm: Normal rate and regular rhythm.     Pulses: Normal pulses.     Heart sounds: Normal heart sounds. No murmur heard.   Pulmonary:     Effort: Pulmonary effort is normal. No respiratory distress.     Breath sounds: Normal breath sounds. No wheezing.  Abdominal:     General: Bowel sounds are normal. There is no distension.     Palpations: Abdomen is soft.     Tenderness: There is no abdominal tenderness.  Musculoskeletal:     Right lower leg: No edema.     Left lower leg: No edema.  Skin:    General: Skin is warm and dry.     Capillary Refill: Capillary refill takes less than 2 seconds.  Neurological:     General: No focal deficit present.     Mental Status: She is alert and oriented to person, place, and time.  Psychiatric:        Mood and Affect: Mood normal.        Behavior: Behavior normal.    Labs reviewed: Basic Metabolic Panel: Recent Labs    10/10/20 0843  NA 141  K 4.3  CL 106  CO2 28  GLUCOSE 85  BUN 17  CREATININE 0.82  CALCIUM 9.0  TSH 1.99   Liver Function Tests: Recent Labs    10/10/20 0843  AST 16  16  ALT 22  22  BILITOT 0.6  0.6  PROT 6.2  6.2   No results for input(s): LIPASE, AMYLASE in the last 8760 hours. No results for input(s):  AMMONIA in the last 8760 hours. CBC: Recent Labs    10/10/20 0843  WBC 3.6*  NEUTROABS 2,171  HGB 12.8  HCT 38.2  MCV 88.4  PLT 221   Lipid Panel: Recent Labs    10/10/20 0843 01/19/21 0838  CHOL 267* 264*  HDL 137 132  LDLCALC 118* 117*  TRIG 39 49  CHOLHDL 1.9 2.0   TSH: Recent Labs    10/10/20 0843  TSH 1.99   A1C: Lab Results  Component Value Date   HGBA1C 5.1 06/08/2019     Assessment/Plan 1. Lung nodule < 6cm on CT - 5 mm nodule found on CT abdomen - intermittent exposure to smoke estimated 8-9 years - non-smoker - no pulmonary symptoms at this time - advised f/u CT lung nodule in 12 months due to smoke exposure in past  2. Vaginal dryness - long history of dryness - stopped taking estradiol due to cancer concerns - advised trying OTC Replens  3. Benign essential microscopic hematuria - followed by urology- advised 1 year f/u - d/t suspect carbuncle during cysto - no urinary symptoms at this time   Total time: 34 minutes. Time greater than 50 % spent doing patient counseling and education on CT results, assessing cancer risk and symptom management.    Next appt: 04/19/2021 Windell Moulding, [facility] [phone]

## 2021-04-11 ENCOUNTER — Other Ambulatory Visit: Payer: Medicare Other

## 2021-04-13 ENCOUNTER — Ambulatory Visit: Payer: Medicare Other | Admitting: Orthopedic Surgery

## 2021-04-13 ENCOUNTER — Other Ambulatory Visit: Payer: Self-pay

## 2021-04-13 DIAGNOSIS — E7800 Pure hypercholesterolemia, unspecified: Secondary | ICD-10-CM

## 2021-04-16 ENCOUNTER — Other Ambulatory Visit: Payer: Medicare Other

## 2021-04-16 ENCOUNTER — Other Ambulatory Visit: Payer: Self-pay

## 2021-04-16 ENCOUNTER — Other Ambulatory Visit: Payer: Self-pay | Admitting: Orthopedic Surgery

## 2021-04-16 DIAGNOSIS — E7800 Pure hypercholesterolemia, unspecified: Secondary | ICD-10-CM

## 2021-04-16 LAB — LIPID PANEL
Cholesterol: 262 mg/dL — ABNORMAL HIGH (ref <200)
HDL: 131 mg/dL (ref >=50)
LDL Cholesterol (Calc): 117 mg/dL (calc) — ABNORMAL HIGH
Non-HDL Cholesterol (Calc): 131 mg/dL (calc) — ABNORMAL HIGH (ref <130)
Total CHOL/HDL Ratio: 2 (calc) (ref <5.0)
Triglycerides: 47 mg/dL (ref <150)

## 2021-04-19 ENCOUNTER — Encounter: Payer: Self-pay | Admitting: Orthopedic Surgery

## 2021-04-19 ENCOUNTER — Other Ambulatory Visit: Payer: Self-pay

## 2021-04-19 ENCOUNTER — Ambulatory Visit (INDEPENDENT_AMBULATORY_CARE_PROVIDER_SITE_OTHER): Payer: Medicare Other | Admitting: Orthopedic Surgery

## 2021-04-19 VITALS — BP 126/78 | HR 57 | Temp 96.8°F | Ht 60.5 in | Wt 128.2 lb

## 2021-04-19 DIAGNOSIS — R311 Benign essential microscopic hematuria: Secondary | ICD-10-CM

## 2021-04-19 DIAGNOSIS — I10 Essential (primary) hypertension: Secondary | ICD-10-CM | POA: Diagnosis not present

## 2021-04-19 DIAGNOSIS — K219 Gastro-esophageal reflux disease without esophagitis: Secondary | ICD-10-CM

## 2021-04-19 DIAGNOSIS — E7800 Pure hypercholesterolemia, unspecified: Secondary | ICD-10-CM

## 2021-04-19 DIAGNOSIS — N898 Other specified noninflammatory disorders of vagina: Secondary | ICD-10-CM

## 2021-04-19 DIAGNOSIS — Z23 Encounter for immunization: Secondary | ICD-10-CM

## 2021-04-19 DIAGNOSIS — R635 Abnormal weight gain: Secondary | ICD-10-CM

## 2021-04-19 DIAGNOSIS — R911 Solitary pulmonary nodule: Secondary | ICD-10-CM

## 2021-04-19 DIAGNOSIS — IMO0001 Reserved for inherently not codable concepts without codable children: Secondary | ICD-10-CM

## 2021-04-19 MED ORDER — ATORVASTATIN CALCIUM 40 MG PO TABS: 40.0000 mg | ORAL_TABLET | Freq: Every day | ORAL | 3 refills | Status: DC

## 2021-04-19 NOTE — Progress Notes (Signed)
Careteam: Patient Care Team: Yvonna Alanis, NP as PCP - General (Adult Health Nurse Practitioner) Karma Ganja, NP as Nurse Practitioner (Nurse Practitioner)  Seen by: Windell Moulding, AGNP-C  PLACE OF SERVICE:  [place] Directive information    No Known Allergies  No chief complaint on file.    HPI: Patient is a 70 y.o. female seen today for medical management of chronic conditions.   Lipid panel reviewed with patient. She is taking lipitor 20 mg daily. Hesitant to increase dosage, believe it will increase her blood sugars. Since she has a few vacations planned over the next few months, she is willing to increase Lipitor to 40 mg.   Vaginal dryness has resolved. She considered trying Replens, but never did. Not using estradiol at this time.   Denies urinary issues after seeing urologist. Advised to f/u in 1 year.   Continues to follow a pescatarian diet. Admits to eating out more. Also likes to snack on cheeses.   Dry eyes have improved. She started using warm compresses over her eyes for 10 minutes or more daily. Unsure when her next eye exam is scheduled. No changes in vision.   She has crowns that have to be redone. No mouth pain. Scheduled to have dental work in August.   Tetanus- she plans to get vaccine at [facility].   She had shingles vaccine, cannot remember when.    Review of Systems:  Review of Systems  Constitutional:  Negative for chills, fever and malaise/fatigue.  HENT: Negative.    Eyes:  Negative for blurred vision and photophobia.       Glasses  Respiratory:  Negative for cough, shortness of breath and wheezing.        Lung nodule  Cardiovascular:  Negative for chest pain and leg swelling.  Gastrointestinal:  Negative for abdominal pain, constipation, heartburn, nausea and vomiting.  Genitourinary:  Negative for dysuria, frequency and hematuria.  Musculoskeletal: Negative.   Skin: Negative.   Neurological:  Negative for dizziness, weakness and  headaches.  Psychiatric/Behavioral:  Negative for depression. The patient is not nervous/anxious and does not have insomnia.    Past Medical History:  Diagnosis Date   Acid reflux    Agatston CAC score 100-199    Basal cell carcinoma    Coronary atherosclerosis    Deviated nasal septum    Eczema    Gastroesophageal reflux disease    H/O bone density study 2019-2015   H/O echocardiogram 2015   H/O exercise stress test 3/4-5 2015   Hearing loss    High blood pressure    High cholesterol    History of chest pain    History of prediabetes    Hx of mammogram 2018-2017-2016-2015-2014   Hyperlipidemia    IBS (irritable bowel syndrome)    Intervertebral disc disease    Iron deficiency anemia    On statin therapy    Osteoarthritis    Osteopenia    unspecified  Location   SNHL (sensorineural hearing loss)    Spinal stenosis    Tinnitus of left ear    Vitamin D deficiency    Past Surgical History:  Procedure Laterality Date   ENDOMETRIAL BIOPSY     EYE SURGERY     MOHS SURGERY     for basal cell to the right of right eye   TONSILLECTOMY     Social History:   reports that she has never smoked. She has never used smokeless tobacco. She reports  current alcohol use. She reports that she does not use drugs.  Family History  Problem Relation Age of Onset   Stroke Mother 54   Basal cell carcinoma Mother    Hypertension Mother    Basal cell carcinoma Father    Hypertension Father    Heart attack Paternal Grandfather    Breast cancer Paternal Aunt    Breast cancer Cousin     Medications: Patient's Medications  New Prescriptions   No medications on file  Previous Medications   ACETAMINOPHEN (TYLENOL EXTRA STRENGTH PO)    Take by mouth as needed.   ASPIRIN EC 81 MG TABLET    Take 81 mg by mouth every Monday, Wednesday, and Friday. Swallow whole.   ATORVASTATIN (LIPITOR) 20 MG TABLET    Take 1 tablet (20 mg total) by mouth daily.   CHOLECALCIFEROL (VITAMIN D) 25 MCG (1000  UNIT) TABLET    Take 1,000 Units by mouth as needed.   ESTRADIOL (ESTRACE) 0.1 MG/GM VAGINAL CREAM    1 gram vaginally twice weekly   FAMOTIDINE (PEPCID) 20 MG TABLET    Take 1 tablet (20 mg total) by mouth daily.   LISINOPRIL (ZESTRIL) 10 MG TABLET    TAKE 1 TABLET BY MOUTH EVERY DAY   VITAMIN B-12 (CYANOCOBALAMIN) 1000 MCG TABLET    Take 1,000 mcg by mouth as needed.  Modified Medications   No medications on file  Discontinued Medications   No medications on file    Physical Exam:  There were no vitals filed for this visit. There is no height or weight on file to calculate BMI. Wt Readings from Last 3 Encounters:  02/22/21 124 lb 9.6 oz (56.5 kg)  01/15/21 122 lb (55.3 kg)  10/17/20 119 lb (54 kg)    Physical Exam Vitals reviewed.  Constitutional:      General: She is not in acute distress. HENT:     Head: Normocephalic.     Nose: Nose normal.     Mouth/Throat:     Mouth: Mucous membranes are moist.  Eyes:     General:        Right eye: No discharge.        Left eye: No discharge.  Neck:     Thyroid: No thyroid mass or thyromegaly.  Cardiovascular:     Rate and Rhythm: Normal rate and regular rhythm.     Pulses: Normal pulses.     Heart sounds: Normal heart sounds. No murmur heard. Pulmonary:     Effort: Pulmonary effort is normal. No respiratory distress.     Breath sounds: Normal breath sounds. No wheezing.  Abdominal:     General: Bowel sounds are normal. There is no distension.     Palpations: Abdomen is soft.     Tenderness: There is no abdominal tenderness.  Musculoskeletal:     Cervical back: Normal range of motion.     Right lower leg: No edema.     Left lower leg: No edema.  Lymphadenopathy:     Cervical: No cervical adenopathy.  Skin:    General: Skin is warm and dry.     Capillary Refill: Capillary refill takes less than 2 seconds.  Neurological:     General: No focal deficit present.     Mental Status: She is alert and oriented to person, place,  and time.     Motor: No weakness.     Gait: Gait normal.  Psychiatric:        Mood and  Affect: Mood normal.        Behavior: Behavior normal.    Labs reviewed: Basic Metabolic Panel: Recent Labs    10/10/20 0843  NA 141  K 4.3  CL 106  CO2 28  GLUCOSE 85  BUN 17  CREATININE 0.82  CALCIUM 9.0  TSH 1.99   Liver Function Tests: Recent Labs    10/10/20 0843  AST 16  16  ALT 22  22  BILITOT 0.6  0.6  PROT 6.2  6.2   No results for input(s): LIPASE, AMYLASE in the last 8760 hours. No results for input(s): AMMONIA in the last 8760 hours. CBC: Recent Labs    10/10/20 0843  WBC 3.6*  NEUTROABS 2,171  HGB 12.8  HCT 38.2  MCV 88.4  PLT 221   Lipid Panel: Recent Labs    10/10/20 0843 01/19/21 0838 04/16/21 0000  CHOL 267* 264* 262*  HDL 137 132 131  LDLCALC 118* 117* 117*  TRIG 39 49 47  CHOLHDL 1.9 2.0 2.0   TSH: Recent Labs    10/10/20 0843  TSH 1.99   A1C: Lab Results  Component Value Date   HGBA1C 5.1 06/08/2019     Assessment/Plan 1. Lung nodule < 6cm on CT - found during CT abdomen ordered by urology - recommend f/u scan 01/2022  2. Pure hypercholesterolemia - [id number] 04/16/2021 - will increase Lipitor to 40 mg  - atorvastatin (LIPITOR) 40 MG tablet; Take 1 tablet (40 mg total) by mouth daily.  Dispense: 90 tablet; Refill: 3 - recommend avoiding foods high in fate and fried foods - lipid panel- future  3. Vaginal dryness - resolved - she is not using estradiol at this time  4. Essential (primary) hypertension - controlled - BUN/creat 17/0.82 10/10/2020 - cont lisinopril 10 mg daily - cbc/diff- future - bmp- future  5. Gastroesophageal reflux disease without esophagitis - stable with Pepcid daily  6. Benign essential microscopic hematuria - followed by urology - suspect caruncle found during cysto as cause  7. Weight gain - BMI 24.63 - admits to eating out more - recommend limiting calories  8. Need for tdap -  advised to go to pharmacy for vaccination  Total time: 35 minutes. Greater than 50% of total time spent doing patient education on health promotion, low fat diet, and medication management.    Next appt: Visit date not found  [place], [facility] [phone]

## 2021-06-22 ENCOUNTER — Encounter: Payer: Self-pay | Admitting: Orthopedic Surgery

## 2021-06-22 NOTE — Telephone Encounter (Signed)
Message was sent to [place]. Dewaine Oats, NP in error, now forwarding message to Yvonna Alanis, NP

## 2021-07-13 ENCOUNTER — Other Ambulatory Visit: Payer: Self-pay | Admitting: Orthopedic Surgery

## 2021-07-13 DIAGNOSIS — K219 Gastro-esophageal reflux disease without esophagitis: Secondary | ICD-10-CM

## 2021-10-12 ENCOUNTER — Other Ambulatory Visit: Payer: Self-pay | Admitting: Orthopedic Surgery

## 2021-10-15 ENCOUNTER — Other Ambulatory Visit: Payer: Medicare Other

## 2021-10-15 ENCOUNTER — Other Ambulatory Visit: Payer: Self-pay

## 2021-10-15 DIAGNOSIS — I10 Essential (primary) hypertension: Secondary | ICD-10-CM

## 2021-10-15 DIAGNOSIS — E7800 Pure hypercholesterolemia, unspecified: Secondary | ICD-10-CM

## 2021-10-15 LAB — BASIC METABOLIC PANEL
BUN: 21 mg/dL (ref 7–25)
CO2: 27 mmol/L (ref 20–32)
Calcium: 9.3 mg/dL (ref 8.6–10.4)
Chloride: 105 mmol/L (ref 98–110)
Creat: 0.78 mg/dL (ref 0.60–1.00)
Glucose, Bld: 88 mg/dL (ref 65–99)
Potassium: 4.1 mmol/L (ref 3.5–5.3)
Sodium: 139 mmol/L (ref 135–146)

## 2021-10-15 LAB — CBC WITH DIFFERENTIAL/PLATELET
Absolute Monocytes: 409 cells/uL (ref 200–950)
Basophils Absolute: 19 cells/uL (ref 0–200)
Basophils Relative: 0.4 %
Eosinophils Absolute: 89 cells/uL (ref 15–500)
Eosinophils Relative: 1.9 %
HCT: 37.4 % (ref 35.0–45.0)
Hemoglobin: 12.4 g/dL (ref 11.7–15.5)
Lymphs Abs: 1659 cells/uL (ref 850–3900)
MCH: 29.5 pg (ref 27.0–33.0)
MCHC: 33.2 g/dL (ref 32.0–36.0)
MCV: 89 fL (ref 80.0–100.0)
MPV: 11.2 fL (ref 7.5–12.5)
Monocytes Relative: 8.7 %
Neutro Abs: 2524 cells/uL (ref 1500–7800)
Neutrophils Relative %: 53.7 %
Platelets: 223 10*3/uL (ref 140–400)
RBC: 4.2 10*6/uL (ref 3.80–5.10)
RDW: 12.9 % (ref 11.0–15.0)
Total Lymphocyte: 35.3 %
WBC: 4.7 10*3/uL (ref 3.8–10.8)

## 2021-10-15 LAB — HEPATIC FUNCTION PANEL
AG Ratio: 2 (calc) (ref 1.0–2.5)
ALT: 24 U/L (ref 6–29)
AST: 19 U/L (ref 10–35)
Albumin: 4 g/dL (ref 3.6–5.1)
Alkaline phosphatase (APISO): 53 U/L (ref 37–153)
Bilirubin, Direct: 0.1 mg/dL (ref 0.0–0.2)
Globulin: 2 g/dL (calc) (ref 1.9–3.7)
Indirect Bilirubin: 0.5 mg/dL (calc) (ref 0.2–1.2)
Total Bilirubin: 0.6 mg/dL (ref 0.2–1.2)
Total Protein: 6 g/dL — ABNORMAL LOW (ref 6.1–8.1)

## 2021-10-15 LAB — LIPID PANEL
Cholesterol: 278 mg/dL — ABNORMAL HIGH (ref <200)
HDL: 140 mg/dL (ref >=50)
LDL Cholesterol (Calc): 126 mg/dL (calc) — ABNORMAL HIGH
Non-HDL Cholesterol (Calc): 138 mg/dL (calc) — ABNORMAL HIGH (ref <130)
Total CHOL/HDL Ratio: 2 (calc) (ref <5.0)
Triglycerides: 38 mg/dL (ref <150)

## 2021-10-18 ENCOUNTER — Ambulatory Visit (INDEPENDENT_AMBULATORY_CARE_PROVIDER_SITE_OTHER): Payer: Medicare Other | Admitting: Orthopedic Surgery

## 2021-10-18 ENCOUNTER — Other Ambulatory Visit: Payer: Self-pay

## 2021-10-18 ENCOUNTER — Encounter: Payer: Self-pay | Admitting: Orthopedic Surgery

## 2021-10-18 VITALS — BP 136/82 | HR 54 | Temp 96.6°F | Ht 60.5 in | Wt 131.2 lb

## 2021-10-18 DIAGNOSIS — K219 Gastro-esophageal reflux disease without esophagitis: Secondary | ICD-10-CM

## 2021-10-18 DIAGNOSIS — Z1231 Encounter for screening mammogram for malignant neoplasm of breast: Secondary | ICD-10-CM | POA: Diagnosis not present

## 2021-10-18 DIAGNOSIS — E7800 Pure hypercholesterolemia, unspecified: Secondary | ICD-10-CM

## 2021-10-18 DIAGNOSIS — Z780 Asymptomatic menopausal state: Secondary | ICD-10-CM

## 2021-10-18 DIAGNOSIS — I700 Atherosclerosis of aorta: Secondary | ICD-10-CM

## 2021-10-18 DIAGNOSIS — R911 Solitary pulmonary nodule: Secondary | ICD-10-CM

## 2021-10-18 DIAGNOSIS — I10 Essential (primary) hypertension: Secondary | ICD-10-CM

## 2021-10-18 DIAGNOSIS — R311 Benign essential microscopic hematuria: Secondary | ICD-10-CM

## 2021-10-18 NOTE — Progress Notes (Signed)
Careteam: Patient Care Team: Yvonna Alanis, NP as PCP - General (Adult Health Nurse Practitioner) Karma Ganja, NP as Nurse Practitioner (Nurse Practitioner)  Seen by: Windell Moulding, AGNP-C  PLACE OF SERVICE:  [place] Directive information    No Known Allergies  No chief complaint on file.    HPI: Patient is a 70 y.o. female seen today for medical management of chronic conditions.   Labs discussed with patient.   Recently had elevated blood pressure reading while seeing dentist, SBP> 160. She continues to take lisinopril daily. She has a blood pressure cuff at home. Follows low sodium diet.   Cholesterol elevated. She has been taking Lipitor 40 mg daily. Admits to eating more cheese and breads. Discussed low fat diet, specifically mediterranean. She would like to try dietary changes and recheck lipid panel in a few months.   Seeing dermatology 11/2021 for annual check up.  Hearing is worse with hearing aids. She is not being followed by audiologist at this time.   2 falls since last visit. She hurt left side with one fall, but it has since resolved.   Bone density and mammogram discussed.   Denies increased reflux, continues to take Pepcid.   Discussed follow up CT regarding lung nodule. Plan for study after 02/08/2022.     Review of Systems:  Review of Systems  Constitutional:  Negative for chills, fever, malaise/fatigue and weight loss.  HENT:  Positive for hearing loss. Negative for sore throat.        Bilateral hearing aids  Eyes:  Negative for blurred vision and double vision.  Respiratory:  Negative for cough, shortness of breath and wheezing.   Cardiovascular:  Negative for chest pain and leg swelling.  Gastrointestinal:  Positive for heartburn. Negative for abdominal pain, blood in stool, constipation, diarrhea, nausea and vomiting.  Genitourinary:  Positive for hematuria. Negative for dysuria and frequency.  Musculoskeletal:  Positive for  falls. Negative for myalgias.  Neurological:  Negative for dizziness, weakness and headaches.  Psychiatric/Behavioral:  Negative for depression. The patient is nervous/anxious. The patient does not have insomnia.    Past Medical History:  Diagnosis Date   Acid reflux    Agatston CAC score 100-199    Basal cell carcinoma    Coronary atherosclerosis    Deviated nasal septum    Eczema    Gastroesophageal reflux disease    H/O bone density study 2019-2015   H/O echocardiogram 2015   H/O exercise stress test 3/4-5 2015   Hearing loss    High blood pressure    High cholesterol    History of chest pain    History of prediabetes    Hx of mammogram 2018-2017-2016-2015-2014   Hyperlipidemia    IBS (irritable bowel syndrome)    Intervertebral disc disease    Iron deficiency anemia    On statin therapy    Osteoarthritis    Osteopenia    unspecified  Location   SNHL (sensorineural hearing loss)    Spinal stenosis    Tinnitus of left ear    Vitamin D deficiency    Past Surgical History:  Procedure Laterality Date   ENDOMETRIAL BIOPSY     EYE SURGERY     MOHS SURGERY     for basal cell to the right of right eye   TONSILLECTOMY     Social History:   reports that she has never smoked. She has never used smokeless tobacco. She reports current alcohol use.  She reports that she does not use drugs.  Family History  Problem Relation Age of Onset   Stroke Mother 76   Basal cell carcinoma Mother    Hypertension Mother    Basal cell carcinoma Father    Hypertension Father    Heart attack Paternal Grandfather    Breast cancer Paternal Aunt    Breast cancer Cousin     Medications: Patient's Medications  New Prescriptions   No medications on file  Previous Medications   ACETAMINOPHEN (TYLENOL EXTRA STRENGTH PO)    Take by mouth as needed.   ASPIRIN EC 81 MG TABLET    Take 81 mg by mouth every Monday, Wednesday, and Friday. Swallow whole.   ATORVASTATIN (LIPITOR) 40 MG TABLET     Take 1 tablet (40 mg total) by mouth daily.   CHOLECALCIFEROL (VITAMIN D) 25 MCG (1000 UNIT) TABLET    Take 1,000 Units by mouth as needed.   ESTRADIOL (ESTRACE) 0.1 MG/GM VAGINAL CREAM    1 gram vaginally twice weekly   FAMOTIDINE (PEPCID) 20 MG TABLET    TAKE 1 TABLET BY MOUTH EVERY DAY   LISINOPRIL (ZESTRIL) 10 MG TABLET    TAKE 1 TABLET BY MOUTH EVERY DAY   VITAMIN B-12 (CYANOCOBALAMIN) 1000 MCG TABLET    Take 1,000 mcg by mouth as needed.  Modified Medications   No medications on file  Discontinued Medications   No medications on file    Physical Exam:  There were no vitals filed for this visit. There is no height or weight on file to calculate BMI. Wt Readings from Last 3 Encounters:  04/19/21 128 lb 3.2 oz (58.2 kg)  02/22/21 124 lb 9.6 oz (56.5 kg)  01/15/21 122 lb (55.3 kg)    Physical Exam Vitals reviewed.  Constitutional:      General: She is not in acute distress. HENT:     Head: Normocephalic.  Eyes:     General:        Right eye: No discharge.        Left eye: No discharge.  Neck:     Vascular: No carotid bruit.  Cardiovascular:     Rate and Rhythm: Normal rate and regular rhythm.     Pulses: Normal pulses.     Heart sounds: No murmur heard. Pulmonary:     Effort: Pulmonary effort is normal. No respiratory distress.     Breath sounds: Normal breath sounds. No wheezing.  Abdominal:     General: Bowel sounds are normal. There is no distension.     Palpations: Abdomen is soft.     Tenderness: There is no abdominal tenderness.  Musculoskeletal:     Cervical back: Normal range of motion.     Right lower leg: No edema.     Left lower leg: No edema.  Lymphadenopathy:     Cervical: No cervical adenopathy.  Skin:    General: Skin is warm and dry.     Capillary Refill: Capillary refill takes less than 2 seconds.  Neurological:     General: No focal deficit present.     Mental Status: She is alert and oriented to person, place, and time.     Motor: No  weakness.     Gait: Gait normal.  Psychiatric:        Mood and Affect: Mood normal.        Behavior: Behavior normal.    Labs reviewed: Basic Metabolic Panel: Recent Labs    10/15/21 0843  NA 139  K 4.1  CL 105  CO2 27  GLUCOSE 88  BUN 21  CREATININE 0.78  CALCIUM 9.3   Liver Function Tests: Recent Labs    10/15/21 0843  AST 19  ALT 24  BILITOT 0.6  PROT 6.0*   No results for input(s): LIPASE, AMYLASE in the last 8760 hours. No results for input(s): AMMONIA in the last 8760 hours. CBC: Recent Labs    10/15/21 0843  WBC 4.7  NEUTROABS 2,524  HGB 12.4  HCT 37.4  MCV 89.0  PLT 223   Lipid Panel: Recent Labs    01/19/21 0838 04/16/21 0000 10/15/21 0843  CHOL 264* 262* 278*  HDL 132 131 140  LDLCALC 117* 117* 126*  TRIG 49 47 38  CHOLHDL 2.0 2.0 2.0   TSH: No results for input(s): TSH in the last 8760 hours. A1C: Lab Results  Component Value Date   HGBA1C 5.1 06/08/2019     Assessment/Plan 1. Postmenopausal - DG Bone Density; Future  2. Encounter for screening mammogram for malignant neoplasm of breast - MM Digital Screening; Future  3. Lung nodule seen on imaging study - 48mm nodule to right lower lobe noted on CT abdomen 02/08/2021 - plan to repeat study after 02/09/2022- will order in spring   4. Pure hypercholesterolemia - LDL 126 10/15/2021, goal < 100 - cont Lipitor - recommend mediterranean diet - avoid foods high in fate and fried foods - lipid panel- future  5. Essential (primary) hypertension - elevated reading at dentist, SBP> 160 - at goal in office today - cont lisinopril - recommend taking blood pressure 2x daily x 1 week- report readings to PCP  6. Gastroesophageal reflux disease without esophagitis - no increased reflux - cont Pepcid  7. Benign essential microscopic hematuria - work up done by urology - asymptomatic at this time  8. Aortic atherosclerosis ([place]) - noted on CT scan 02/08/2021 - cont asa and  statin  Total time: 36 minutes. Greater than 50% of total time spent doing patient education regarding health maintenance, blood pressure, elevated cholesterol and lung nodule.     Next appt: Visit date not found  [place], [facility] [phone]

## 2021-10-18 NOTE — Patient Instructions (Addendum)
Drink more water!   Blood pressures- twice a day x 1 week  Schedule lab visit to have cholesterol lab work 02/2022  Orders for mammogram and bone density completed- may call [facility] to schedule  CT scan for lung nodule due in March 2023- I will place orders and [facility] will call to schedule

## 2021-12-04 ENCOUNTER — Ambulatory Visit
Admission: RE | Admit: 2021-12-04 | Discharge: 2021-12-04 | Disposition: A | Discharge status: Home / Self Care | Payer: Medicare Other | Source: Ambulatory Visit | Attending: Orthopedic Surgery | Admitting: Orthopedic Surgery

## 2021-12-04 DIAGNOSIS — Z1231 Encounter for screening mammogram for malignant neoplasm of breast: Secondary | ICD-10-CM

## 2021-12-05 ENCOUNTER — Encounter: Payer: Self-pay | Admitting: Orthopedic Surgery

## 2021-12-06 ENCOUNTER — Other Ambulatory Visit: Payer: Self-pay | Admitting: Orthopedic Surgery

## 2021-12-06 DIAGNOSIS — M79673 Pain in unspecified foot: Secondary | ICD-10-CM

## 2021-12-11 ENCOUNTER — Ambulatory Visit: Payer: Medicare Other

## 2021-12-11 ENCOUNTER — Other Ambulatory Visit: Payer: Self-pay

## 2021-12-11 ENCOUNTER — Ambulatory Visit (INDEPENDENT_AMBULATORY_CARE_PROVIDER_SITE_OTHER): Payer: Medicare Other | Admitting: Podiatry

## 2021-12-11 VITALS — BP 117/67 | HR 54 | Temp 97.6°F | Resp 18

## 2021-12-11 DIAGNOSIS — M79671 Pain in right foot: Secondary | ICD-10-CM

## 2021-12-11 DIAGNOSIS — M7741 Metatarsalgia, right foot: Secondary | ICD-10-CM

## 2021-12-15 NOTE — Progress Notes (Signed)
Subjective:   Patient ID: Holly Russell, female   DOB: 71 y.o.   MRN: [id number]   HPI 71 year old female presents the office today for concerns of pain to the lateral aspect the right foot.  This started around December 2022.  She says it has been getting better since the symptoms started.  She said that she is not noticing discomfort after she walked 2 miles and she was to walk about 3 miles a day.  She states that she works on [employer] which help more than the new balance.  She states that there are days that are "hit and miss" comes to the pain.  She does describe the pain as stabbing, sharp but no radiating pain.  She is currently seeing dermatology for multiple warts on the foot.  She is getting injections for this around the area where it does hurt on the side of the foot.   Review of Systems  All other systems reviewed and are negative.  Past Medical History:  Diagnosis Date   Acid reflux    Agatston CAC score 100-199    Basal cell carcinoma    Coronary atherosclerosis    Deviated nasal septum    Eczema    Gastroesophageal reflux disease    H/O bone density study 2019-2015   H/O echocardiogram 2015   H/O exercise stress test 3/4-5 2015   Hearing loss    High blood pressure    High cholesterol    History of chest pain    History of prediabetes    Hx of mammogram 2018-2017-2016-2015-2014   Hyperlipidemia    IBS (irritable bowel syndrome)    Intervertebral disc disease    Iron deficiency anemia    On statin therapy    Osteoarthritis    Osteopenia    unspecified  Location   SNHL (sensorineural hearing loss)    Spinal stenosis    Tinnitus of left ear    Vitamin D deficiency     Past Surgical History:  Procedure Laterality Date   ENDOMETRIAL BIOPSY     EYE SURGERY     MOHS SURGERY     for basal cell to the right of right eye   TONSILLECTOMY       Current Outpatient Medications:    triamcinolone ointment (KENALOG) 0.1 %, , Disp: , Rfl:    Acetaminophen  (TYLENOL EXTRA STRENGTH PO), Take by mouth as needed., Disp: , Rfl:    aspirin 81 MG chewable tablet, , Disp: , Rfl:    aspirin EC 81 MG tablet, Take 81 mg by mouth every Monday, Wednesday, and Friday. Swallow whole., Disp: , Rfl:    atorvastatin (LIPITOR) 40 MG tablet, Take 1 tablet (40 mg total) by mouth daily., Disp: 90 tablet, Rfl: 3   atorvastatin (LIPITOR) 40 MG tablet, , Disp: , Rfl:    cholecalciferol (VITAMIN D) 25 MCG (1000 UNIT) tablet, Take 1,000 Units by mouth as needed., Disp: , Rfl:    clobetasol ointment (TEMOVATE) 0.05 %, Apply topically 2 (two) times daily., Disp: , Rfl:    clotrimazole (LOTRIMIN AF) 1 % cream, , Disp: , Rfl:    famotidine (PEPCID) 20 MG tablet, TAKE 1 TABLET BY MOUTH EVERY DAY, Disp: 90 tablet, Rfl: 1   famotidine (PEPCID) 20 MG tablet, , Disp: , Rfl:    lisinopril (ZESTRIL) 10 MG tablet, TAKE 1 TABLET BY MOUTH EVERY DAY, Disp: 90 tablet, Rfl: 2   lisinopril (ZESTRIL) 10 MG tablet, , Disp: , Rfl:  Polyethyl Glycol-Propyl Glycol (SYSTANE) 0.4-0.3 % SOLN, , Disp: , Rfl:    vitamin B-12 (CYANOCOBALAMIN) 1000 MCG tablet, Take 1,000 mcg by mouth as needed., Disp: , Rfl:   No Known Allergies       Objective:  Physical Exam  General: AAO x3, NAD  Dermatological: Multiple verruca noted.  There is no open lesions or any open sores or drainage.  There is no erythema or signs of infection.  Vascular: Dorsalis Pedis artery and Posterior Tibial artery pedal pulses are 2/4 bilateral with immedate capillary fill time. There is no pain with calf compression, swelling, warmth, erythema.   Neruologic: Grossly intact via light touch bilateral.   Musculoskeletal: There is tenderness palpation submetatarsal on the right foot mostly along submetatarsal 5 area.  There is no area pinpoint tenderness.  There is proximal of metatarsal head plantarly.  No pain in the dorsal metatarsals.  No edema, erythema.  Flexor, extensor tendons appear to be intact.  Muscular strength 5/5  in all groups tested bilateral.  Upon weightbearing the left side greater than right is a decreased medial arch height.  Gait: Unassisted, Nonantalgic.       Assessment:   Metatarsalgia right foot     Plan:  -Treatment options discussed including all alternatives, risks, and complications -Etiology of symptoms were discussed -Dispensed metatarsal pads to help offload the area.  Discussed shoe modifications as well.  Voltaren gel as needed. -Continue follow-up with dermatology for the warts.  Pain is also somewhat localized around the area of the injection.  No signs of infection.  Return if symptoms worsen or fail to improve.  X-ray next appointment if symptoms continue  Trula Slade DPM

## 2022-01-17 ENCOUNTER — Other Ambulatory Visit: Payer: Self-pay | Admitting: Orthopedic Surgery

## 2022-02-12 ENCOUNTER — Other Ambulatory Visit: Payer: Self-pay | Admitting: Orthopedic Surgery

## 2022-02-12 DIAGNOSIS — R911 Solitary pulmonary nodule: Secondary | ICD-10-CM

## 2022-03-06 ENCOUNTER — Encounter: Payer: Self-pay | Admitting: Orthopedic Surgery

## 2022-03-07 ENCOUNTER — Ambulatory Visit (INDEPENDENT_AMBULATORY_CARE_PROVIDER_SITE_OTHER): Payer: Medicare Other | Admitting: Orthopedic Surgery

## 2022-03-07 ENCOUNTER — Encounter: Payer: Self-pay | Admitting: Orthopedic Surgery

## 2022-03-07 VITALS — BP 116/68 | HR 55 | Temp 97.5°F | Ht 60.5 in

## 2022-03-07 DIAGNOSIS — Z0000 Encounter for general adult medical examination without abnormal findings: Secondary | ICD-10-CM

## 2022-03-07 DIAGNOSIS — H9113 Presbycusis, bilateral: Secondary | ICD-10-CM | POA: Diagnosis not present

## 2022-03-07 NOTE — Patient Instructions (Signed)
?  Holly Russell , ?Thank you for taking time to come for your Medicare Wellness Visit. I appreciate your ongoing commitment to your health goals. Please review the following plan we discussed and let me know if I can assist you in the future.  ? ?These are the goals we discussed: ? Goals   ? ?  Weight (lb) < 200 lb (90.7 kg)   ?  Would like to lose 10 lbs ?  ? ?  ?  ?This is a list of the screening recommended for you and due dates:  ?Health Maintenance  ?Topic Date Due  ? Hepatitis C Screening: USPSTF Recommendation to screen - Ages 70-79 yo.  Never done  ? Zoster (Shingles) Vaccine (1 of 2) Never done  ? DEXA scan (bone density measurement)  Never done  ? Tetanus Vaccine  06/01/2019  ? Flu Shot  06/04/2022  ? Colon Cancer Screening  01/03/2023  ? Mammogram  12/05/2023  ? Pneumonia Vaccine  Completed  ? COVID-19 Vaccine  Completed  ? HPV Vaccine  Aged Out  ? ?Please get your Tdap vaccine at local pharmacy.  ?

## 2022-03-07 NOTE — Progress Notes (Signed)
? ?Subjective:  ? Holly Russell is a 71 y.o. female who presents for Medicare Annual (Subsequent) preventive examination. ? ?Place of Service: [place] ?Provider: Windell Moulding, AGNP-C  ? ?Review of Systems    ? ?Cardiac Risk Factors include: hypertension;family history of premature cardiovascular disease ? ?   ?Objective:  ?  ?Today's Vitals  ? 03/07/22 1125  ?BP: 116/68  ?Pulse: (!) 55  ?Temp: (!) 97.5 ?F (36.4 ?C)  ?TempSrc: Temporal  ?SpO2: 90%  ?Height: 5' 0.5" (1.537 m)  ? ?Body mass index is 25.2 kg/m?. ? ? ?  03/07/2022  ?  1:10 PM 09/15/2020  ? 11:43 AM  ?Advanced Directives  ?Does Patient Have a Medical Advance Directive? No Yes  ?Does patient want to make changes to medical advance directive? No - Patient declined   ? ? ?Current Medications (verified) ?Outpatient Encounter Medications as of 03/07/2022  ?Medication Sig  ? Acetaminophen (TYLENOL EXTRA STRENGTH PO) Take by mouth as needed.  ? aspirin EC 81 MG tablet Take 81 mg by mouth every Monday, Wednesday, and Friday. Swallow whole.  ? atorvastatin (LIPITOR) 40 MG tablet Take 1 tablet (40 mg total) by mouth daily.  ? cholecalciferol (VITAMIN D) 25 MCG (1000 UNIT) tablet Take 1,000 Units by mouth as needed.  ? clobetasol ointment (TEMOVATE) 0.05 % Apply topically as needed.  ? clotrimazole (LOTRIMIN) 1 % cream as needed.  ? famotidine (PEPCID) 20 MG tablet TAKE 1 TABLET BY MOUTH EVERY DAY  ? lisinopril (ZESTRIL) 10 MG tablet TAKE 1 TABLET BY MOUTH EVERY DAY  ? Polyethyl Glycol-Propyl Glycol (SYSTANE) 0.4-0.3 % SOLN as needed.  ? vitamin B-12 (CYANOCOBALAMIN) 1000 MCG tablet Take 1,000 mcg by mouth as needed.  ? [DISCONTINUED] aspirin 81 MG chewable tablet  (Patient not taking: Reported on 03/07/2022)  ? [DISCONTINUED] atorvastatin (LIPITOR) 40 MG tablet   ? [DISCONTINUED] famotidine (PEPCID) 20 MG tablet  (Patient not taking: Reported on 03/07/2022)  ? [DISCONTINUED] lisinopril (ZESTRIL) 10 MG tablet  (Patient not taking: Reported on 03/07/2022)  ?  [DISCONTINUED] triamcinolone ointment (KENALOG) 0.1 %   ? ?No facility-administered encounter medications on file as of 03/07/2022.  ? ? ?Allergies (verified) ?Patient has no known allergies.  ? ?History: ?Past Medical History:  ?Diagnosis Date  ? Acid reflux   ? Agatston CAC score 100-199   ? Basal cell carcinoma   ? Coronary atherosclerosis   ? Deviated nasal septum   ? Eczema   ? Gastroesophageal reflux disease   ? H/O bone density study 2019-2015  ? H/O echocardiogram 2015  ? H/O exercise stress test 3/4-5 2015  ? Hearing loss   ? High blood pressure   ? High cholesterol   ? History of chest pain   ? History of prediabetes   ? Hx of mammogram 2018-2017-2016-2015-2014  ? Hyperlipidemia   ? IBS (irritable bowel syndrome)   ? Intervertebral disc disease   ? Iron deficiency anemia   ? On statin therapy   ? Osteoarthritis   ? Osteopenia   ? unspecified  Location  ? SNHL (sensorineural hearing loss)   ? Spinal stenosis   ? Tinnitus of left ear   ? Vitamin D deficiency   ? ?Past Surgical History:  ?Procedure Laterality Date  ? ENDOMETRIAL BIOPSY    ? EYE SURGERY    ? MOHS SURGERY    ? for basal cell to the right of right eye  ? TONSILLECTOMY    ? ?Family History  ?Problem Relation  Age of Onset  ? Stroke Mother 40  ? Basal cell carcinoma Mother   ? Hypertension Mother   ? Basal cell carcinoma Father   ? Hypertension Father   ? Breast cancer Paternal Aunt   ?     72s  ? Heart attack Paternal Grandfather   ? Breast cancer Cousin   ?     30s-40s  ? ?Social History  ? ?Socioeconomic History  ? Marital status: Married  ?  Spouse name: Not on file  ? Number of children: Not on file  ? Years of education: Not on file  ? Highest education level: Not on file  ?Occupational History  ? Not on file  ?Tobacco Use  ? Smoking status: Never  ? Smokeless tobacco: Never  ?Vaping Use  ? Vaping Use: Never used  ?Substance and Sexual Activity  ? Alcohol use: Yes  ?  Comment: 3-4  ? Drug use: Never  ? Sexual activity: Not Currently  ?   Partners: Male  ?  Birth control/protection: Post-menopausal  ?Other Topics Concern  ? Not on file  ?Social History Narrative  ? Diet: Plant Based and Fish  ?   ? Do you drink/ eat things with caffeine?  Yes  ?   ? Marital status:  Married                             What year were you married ? 1976  ?   ? Do you live in a house, apartment,assistred living, condo, trailer, etc.)? Condo-Town House  ?   ? Is it one or more stories? 2  ?   ? How many persons live in your home ? 2  ?   ? Do you have any pets in your home ?(please list) No  ?   ? Highest Level of education completed: [place]  ?   ? Current or past profession: Attorney  ?   ? Do you exercise?     Yes                         Type & how often Walk-3 miles daily  ?   ? ADVANCED DIRECTIVES (Please bring copies)  ?   ? Do you have a living will? Yes  ?   ? Do you have a DNR form?   Yes                    If not, do you want to discuss one?   ?   ? Do you have signed POA?HPOA forms?  Yes               If so, please bring to your appointment  ?   ? FUNCTIONAL STATUS- To be completed by Spouse / child / Staff   ?   ? Do you have difficulty bathing or dressing yourself ?  No  ?   ? Do you have difficulty preparing food or eating ?  No  ?   ? Do you have difficulty managing your mediation ?  No  ?   ? Do you have difficulty managing your finances ?  No  ?   ? Do you have difficulty affording your medication ?  No  ?   ? ?Social Determinants of Health  ? ?Financial Resource Strain: Low Risk   ? Difficulty of Paying Living Expenses: Not  hard at all  ?Food Insecurity: No Food Insecurity  ? Worried About Charity fundraiser in the Last Year: Never true  ? Ran Out of Food in the Last Year: Never true  ?Transportation Needs: No Transportation Needs  ? Lack of Transportation (Medical): No  ? Lack of Transportation (Non-Medical): No  ?Physical Activity: Sufficiently Active  ? Days of Exercise per Week: 6 days  ? Minutes of Exercise per Session: 70 min  ?Stress: Stress  Concern Present  ? Feeling of Stress : To some extent  ?Social Connections: Moderately Isolated  ? Frequency of Communication with Friends and Family: Twice a week  ? Frequency of Social Gatherings with Friends and Family: Once a week  ? Attends Religious Services: Never  ? Active Member of Clubs or Organizations: No  ? Attends Archivist Meetings: Never  ? Marital Status: Married  ? ? ?Tobacco Counseling ?Counseling given: Not Answered ? ? ?Clinical Intake: ? ?Pre-visit preparation completed: No ? ?Pain : No/denies pain ? ?  ? ?BMI - recorded: 25.26 ?Nutritional Status: BMI 25 -29 Overweight ?Nutritional Risks: Unintentional weight gain (gained about 3-4 lbs) ?Diabetes: No ? ?How often do you need to have someone help you when you read instructions, pamphlets, or other written materials from your doctor or pharmacy?: 1 - Never ?What is the last grade level you completed in school?: Doctorate degree ? ?Diabetic?No ? ?Interpreter Needed?: No ? ?  ? ? ?Activities of Daily Living ? ?  03/07/2022  ?  1:40 PM  ?In your present state of health, do you have any difficulty performing the following activities:  ?Hearing? 0  ?Vision? 0  ?Difficulty concentrating or making decisions? 0  ?Walking or climbing stairs? 0  ?Dressing or bathing? 0  ?Doing errands, shopping? 0  ?Preparing Food and eating ? N  ?Using the Toilet? N  ?In the past six months, have you accidently leaked urine? Y  ?Do you have problems with loss of bowel control? N  ?Managing your Medications? N  ?Managing your Finances? N  ?Housekeeping or managing your Housekeeping? N  ? ? ?Patient Care Team: ?Yvonna Alanis, NP as PCP - General (Adult Health Nurse Practitioner) ?Karma Ganja, NP as Nurse Practitioner (Nurse Practitioner) ? ?Indicate any recent Medical Services you may have received from other than [facility] providers in the past year (date may be approximate). ? ?   ?Assessment:  ? This is a routine wellness examination for Holly Russell. ? ?Hearing/Vision  screen ?Hearing Screening - Comments:: Requesting referral to ears, nose, throat doctor. ?Vision Screening - Comments:: Patient gets eyes checked yearly.  ? ?Dietary issues and exercise activities discussed: ?Current

## 2022-03-11 ENCOUNTER — Ambulatory Visit
Admission: RE | Admit: 2022-03-11 | Discharge: 2022-03-11 | Disposition: A | Discharge status: Home / Self Care | Payer: Medicare Other | Source: Ambulatory Visit | Attending: Orthopedic Surgery | Admitting: Orthopedic Surgery

## 2022-03-11 DIAGNOSIS — R911 Solitary pulmonary nodule: Secondary | ICD-10-CM

## 2022-03-19 ENCOUNTER — Other Ambulatory Visit: Payer: Self-pay | Admitting: Orthopedic Surgery

## 2022-03-19 ENCOUNTER — Ambulatory Visit: Payer: Medicare Other | Attending: Audiologist | Admitting: Audiologist

## 2022-03-19 DIAGNOSIS — H903 Sensorineural hearing loss, bilateral: Secondary | ICD-10-CM | POA: Insufficient documentation

## 2022-03-19 DIAGNOSIS — H9193 Unspecified hearing loss, bilateral: Secondary | ICD-10-CM

## 2022-03-19 DIAGNOSIS — H918X9 Other specified hearing loss, unspecified ear: Secondary | ICD-10-CM

## 2022-03-19 NOTE — Progress Notes (Signed)
Audiologist, Alfonse Alpers Au.D, recommended referral to Dr. Benjamine Mola due to asymmetric hearing loss. Referral to ENT also recommended due to past Meniere's.  ? ?

## 2022-03-19 NOTE — Procedures (Signed)
?[facility] ?[address] ?[place], [place]  [address] ?[phone] ? ?AUDIOLOGICAL  EVALUATION ? ?NAME: Holly Russell     ?DOB:   [birth year]      ?MRN: [id number]                                                                                     ?DATE: 03/19/2022     ?REFERENT: Yvonna Alanis, NP ?STATUS: Outpatient ?DIAGNOSIS: Sensorineural Asymmetric Hearing Loss Bilateral   ? ?History: ?Jakaila was seen for an audiological evaluation. ?Khaylee is receiving a hearing evaluation due to concerns for increased difficulty hearing even with hearing aids on. Caryle has difficulty at all times, her husband has to repeat himself constantly. She is noticing her hearing aids are not helping as much as they used to. Margaretha Sheffield wears [employer] ITE hearing aids bilaterally. She has not had them serviced since moving to [place] from [place]. The hearing aids are about 72 years old. Lynnette used to see Otolaryngology in [facility] due to asymmetric loss. Her left ear has always been better than the right. Mazell says her loss has been present from childhood, maybe even since birth but she is not sure. She tried hearing aids for the first time in her 1s but they were not helpful. No pain or pressure reported in either ear. Tinnitus present in both ears sounding like rushing water. Kallie was told she may have Meneiere's.  ?Medical history negative for a chronic health condition which is a risk factor for hearing loss. No other relevant case history reported.  ? ?Evaluation:  ?Otoscopy showed a clear view of the tympanic membranes, bilaterally ?Tympanometry results were consistent with normal middle ear pressure, bilaterally   ?Audiometric testing was completed using conventional audiometry with inserts and supraural transducer. Speech Recognition Thresholds were consistent with pure tone averages, 50dB in the left ear and 70dB in the right ear. Word recognition testest at 90dB in the right ear with  masking, scored 88% and tested at 80dB in the left ear scoring 96%. Pure tone thresholds in the left ear show mild sloping to severe sensorineural  hearing loss, right ear shows moderate sloping to severe sensorineural  hearing loss. Test results are consistent with asymmetry with right ear worse 250-2kHz.  ? ?Results:  ?The test results were reviewed with Margaretha Sheffield. She was given a copy of her audiogram and a list of local places the dispense hearing aids. For her [employer] aids she will have to go to [employer] to get them reprogrammed, they lock the software so only [employer] can make programming changes. I listened to her hearing aids and their volume is not adequate for her level of hearing loss, she needs them to be reprogrammed to today's test. Margaretha Sheffield reported understanding. It was also recommended she establish care with a local Otolaryngology, Didi is in agreement.   ? ?Recommendations: ?Amplification reprogramming is necessary for both ears. Hearing aids can be purchased from a variety of locations. See provided list for locations in the [place] area. [employer] Hearing Aids have to be programmed at [employer].  ?Referral to ENT Physician necessary due to asymmetric hearing loss.  ?  ?  46 minutes spent testing and counseling on results.  ? ?Alfonse Alpers  ?Audiologist, Au.D., CCC-A ?03/19/2022  9:55 AM ? ?Cc: Yvonna Alanis, NP ? ?

## 2022-03-26 ENCOUNTER — Encounter: Payer: Self-pay | Admitting: Orthopedic Surgery

## 2022-04-05 ENCOUNTER — Ambulatory Visit
Admission: RE | Admit: 2022-04-05 | Discharge: 2022-04-05 | Disposition: A | Discharge status: Home / Self Care | Payer: Medicare Other | Source: Ambulatory Visit | Attending: Orthopedic Surgery | Admitting: Orthopedic Surgery

## 2022-04-05 DIAGNOSIS — Z780 Asymptomatic menopausal state: Secondary | ICD-10-CM

## 2022-04-29 ENCOUNTER — Other Ambulatory Visit: Payer: Medicare Other

## 2022-04-29 DIAGNOSIS — E7800 Pure hypercholesterolemia, unspecified: Secondary | ICD-10-CM

## 2022-04-29 LAB — LIPID PANEL
Cholesterol: 267 mg/dL — ABNORMAL HIGH (ref <200)
HDL: 141 mg/dL (ref >=50)
LDL Cholesterol (Calc): 112 mg/dL (calc) — ABNORMAL HIGH
Non-HDL Cholesterol (Calc): 126 mg/dL (calc) (ref <130)
Total CHOL/HDL Ratio: 1.9 (calc) (ref <5.0)
Triglycerides: 46 mg/dL (ref <150)

## 2022-05-02 ENCOUNTER — Ambulatory Visit: Payer: Medicare Other | Admitting: Orthopedic Surgery

## 2022-05-08 ENCOUNTER — Encounter: Payer: Self-pay | Admitting: Orthopedic Surgery

## 2022-05-09 ENCOUNTER — Ambulatory Visit (INDEPENDENT_AMBULATORY_CARE_PROVIDER_SITE_OTHER): Payer: Medicare Other | Admitting: Orthopedic Surgery

## 2022-05-09 ENCOUNTER — Encounter: Payer: Self-pay | Admitting: Orthopedic Surgery

## 2022-05-09 VITALS — BP 132/60 | HR 66 | Temp 97.1°F | Resp 20 | Ht 60.5 in | Wt 132.0 lb

## 2022-05-09 DIAGNOSIS — M79671 Pain in right foot: Secondary | ICD-10-CM

## 2022-05-09 DIAGNOSIS — R911 Solitary pulmonary nodule: Secondary | ICD-10-CM

## 2022-05-09 DIAGNOSIS — M810 Age-related osteoporosis without current pathological fracture: Secondary | ICD-10-CM

## 2022-05-09 DIAGNOSIS — I700 Atherosclerosis of aorta: Secondary | ICD-10-CM

## 2022-05-09 DIAGNOSIS — I10 Essential (primary) hypertension: Secondary | ICD-10-CM

## 2022-05-09 DIAGNOSIS — R300 Dysuria: Secondary | ICD-10-CM

## 2022-05-09 DIAGNOSIS — M79672 Pain in left foot: Secondary | ICD-10-CM

## 2022-05-09 DIAGNOSIS — E7800 Pure hypercholesterolemia, unspecified: Secondary | ICD-10-CM | POA: Diagnosis not present

## 2022-05-09 NOTE — Progress Notes (Signed)
Careteam: Patient Care Team: Yvonna Alanis, NP as PCP - General (Adult Health Nurse Practitioner) Karma Ganja, NP as Nurse Practitioner (Nurse Practitioner)  Seen by: Windell Moulding, AGNP-C  PLACE OF SERVICE:  [place] Directive information Does Patient Have a Medical Advance Directive?: No, Would patient like information on creating a medical advance directive?: No - Patient declined  No Known Allergies  Chief Complaint  Patient presents with   Medical Management of Chronic Issues    Patient is here for a 39M F/U for chronic conditions, and Lipid Panel lab review Discuss need for updated Tdap     HPI: Patient is a 71 y.o. female seen today for medical management of chronic conditions.   Labs reviewed with patient.   HLD- LDL 112, continues to take Lipitor 40 mg, admits to indulging in cheese, would like to try weight loss and dietary precautions  HTN- remains on lisinopril  Osteoporosis- DEXA 04/05/2022, t score -2.7, remains on Caltrate, walks 3 miles most days with husband  Pulmonary nodules- first noticed when seeing urology 2022, CT chest 03/11/2022 noted 3 mm nodule and 54m x 629mnodule along right pericardium  Mammogram completed 04/05/2022, no evidence of malignancy.   Scheduled to see urology 07/2022 due to ongoing dysuria, unsure if related with vaginal dryness, discussed Replens   Review of Systems:  Review of Systems  Constitutional:  Negative for fever and malaise/fatigue.  HENT:  Positive for hearing loss. Negative for sore throat.   Eyes:  Negative for blurred vision.  Respiratory:  Negative for cough, shortness of breath and wheezing.   Cardiovascular:  Negative for chest pain and leg swelling.  Gastrointestinal:  Positive for heartburn. Negative for abdominal pain, constipation, diarrhea, nausea and vomiting.  Genitourinary:  Positive for dysuria. Negative for frequency and hematuria.  Musculoskeletal:  Negative for falls.       Bilateral  foot pain   Neurological:  Negative for dizziness, weakness and headaches.  Psychiatric/Behavioral:  Negative for depression. The patient is not nervous/anxious and does not have insomnia.     Past Medical History:  Diagnosis Date   Acid reflux    Agatston CAC score 100-199    Basal cell carcinoma    Coronary atherosclerosis    Deviated nasal septum    Eczema    Gastroesophageal reflux disease    H/O bone density study 2019-2015   H/O echocardiogram 2015   H/O exercise stress test 3/4-5 2015   Hearing loss    High blood pressure    High cholesterol    History of chest pain    History of prediabetes    Hx of mammogram 2018-2017-2016-2015-2014   Hyperlipidemia    IBS (irritable bowel syndrome)    Intervertebral disc disease    Iron deficiency anemia    On statin therapy    Osteoarthritis    Osteopenia    unspecified  Location   SNHL (sensorineural hearing loss)    Spinal stenosis    Tinnitus of left ear    Vitamin D deficiency    Past Surgical History:  Procedure Laterality Date   ENDOMETRIAL BIOPSY     EYE SURGERY     MOHS SURGERY     for basal cell to the right of right eye   TONSILLECTOMY     Social History:   reports that she has never smoked. She has never used smokeless tobacco. She reports current alcohol use. She reports that she does not use drugs.  Family History  Problem Relation Age of Onset   Stroke Mother 81   Basal cell carcinoma Mother    Hypertension Mother    Basal cell carcinoma Father    Hypertension Father    Breast cancer Paternal Aunt        35s   Heart attack Paternal Grandfather    Breast cancer Cousin        30s-40s    Medications: Patient's Medications  New Prescriptions   No medications on file  Previous Medications   ACETAMINOPHEN (TYLENOL EXTRA STRENGTH PO)    Take by mouth as needed.   ASPIRIN EC 81 MG TABLET    Take 81 mg by mouth every Monday, Wednesday, and Friday. Swallow whole.   ATORVASTATIN (LIPITOR) 40 MG TABLET     Take 1 tablet (40 mg total) by mouth daily.   CHOLECALCIFEROL (VITAMIN D) 25 MCG (1000 UNIT) TABLET    Take 1,000 Units by mouth as needed.   CLOBETASOL OINTMENT (TEMOVATE) 0.05 %    Apply topically as needed.   CLOTRIMAZOLE (LOTRIMIN) 1 % CREAM    as needed.   FAMOTIDINE (PEPCID) 20 MG TABLET    TAKE 1 TABLET BY MOUTH EVERY DAY   LISINOPRIL (ZESTRIL) 10 MG TABLET    TAKE 1 TABLET BY MOUTH EVERY DAY   POLYETHYL GLYCOL-PROPYL GLYCOL (SYSTANE) 0.4-0.3 % SOLN    as needed.   VITAMIN B-12 (CYANOCOBALAMIN) 1000 MCG TABLET    Take 1,000 mcg by mouth as needed.  Modified Medications   No medications on file  Discontinued Medications   No medications on file    Physical Exam:  There were no vitals filed for this visit. There is no height or weight on file to calculate BMI. Wt Readings from Last 3 Encounters:  10/18/21 131 lb 3.2 oz (59.5 kg)  04/19/21 128 lb 3.2 oz (58.2 kg)  02/22/21 124 lb 9.6 oz (56.5 kg)    Physical Exam Vitals reviewed.  Constitutional:      General: She is not in acute distress. HENT:     Head: Normocephalic.  Eyes:     General:        Right eye: No discharge.        Left eye: No discharge.  Cardiovascular:     Rate and Rhythm: Normal rate and regular rhythm.     Pulses: Normal pulses.     Heart sounds: Normal heart sounds.  Pulmonary:     Effort: Pulmonary effort is normal. No respiratory distress.     Breath sounds: Normal breath sounds. No wheezing.  Abdominal:     General: Bowel sounds are normal. There is no distension.     Palpations: Abdomen is soft.     Tenderness: There is no abdominal tenderness.  Musculoskeletal:     Cervical back: Neck supple.     Right lower leg: No edema.     Left lower leg: No edema.  Skin:    General: Skin is warm and dry.     Capillary Refill: Capillary refill takes less than 2 seconds.  Neurological:     General: No focal deficit present.     Mental Status: She is alert and oriented to person, place, and time.   Psychiatric:        Mood and Affect: Mood normal.        Behavior: Behavior normal.     Labs reviewed: Basic Metabolic Panel: Recent Labs    10/15/21 0843  NA 139  K 4.1  CL 105  CO2 27  GLUCOSE 88  BUN 21  CREATININE 0.78  CALCIUM 9.3   Liver Function Tests: Recent Labs    10/15/21 0843  AST 19  ALT 24  BILITOT 0.6  PROT 6.0*   No results for input(s): "LIPASE", "AMYLASE" in the last 8760 hours. No results for input(s): "AMMONIA" in the last 8760 hours. CBC: Recent Labs    10/15/21 0843  WBC 4.7  NEUTROABS 2,524  HGB 12.4  HCT 37.4  MCV 89.0  PLT 223   Lipid Panel: Recent Labs    10/15/21 0843 04/29/22 0813  CHOL 278* 267*  HDL 140 141  LDLCALC 126* 112*  TRIG 38 46  CHOLHDL 2.0 1.9   TSH: No results for input(s): "TSH" in the last 8760 hours. A1C: Lab Results  Component Value Date   HGBA1C 5.1 06/08/2019     Assessment/Plan 1. Essential (primary) hypertension - controlled with lisinopril - BUN/creat 21/0.78 10/15/2021 - bmp- future  2. Pure hypercholesterolemia - LDL 112 (06/26), was 126 (10/15/2021) - discussed diet low in fats, dairy and fried foods - will try dietary precautions  - lipid panel- future  3. Age-related osteoporosis without current pathological fracture - DEXA 04/05/2022, t score -2.7 (spine) - not interested in Evenity/Prolia injections - cont Caltrate - weight bearing exercise  4. Lung nodule seen on imaging study - first noticed when seeing urology 2022 - CT chest 03/11/2022 noted 3 mm nodule and 45m x 642mnodule along right pericardium - asymptomatic - consider pulmonary consult in future  5. Dysuria - ongoing - cystitis versus vaginal atrophy - followed by urology- next app 07/2022 - advised to hydrate more with water - may try AZO for burning - discussed Replens - may consider estradiol gel if symptoms persist  6. Aortic atherosclerosis (HCLynch- CT chest 03/11/2022 - cont asa and statin  7.  Bilateral foot pain - intermittent - evaluated by podiatry - discussed orthotics   Total time: 33 minutes. Greater than 50% of total time spent doing patient education regarding health maintenance, low fat diet, symptom/medication management.     Next appt: Visit date not found  [phone]

## 2022-05-09 NOTE — Patient Instructions (Addendum)
Continue weight bearing exercise  Try Caltrate chocolate chew  Continue low fat diet   Try "my fitness pal" to track calories x 1 week   Replens- vaginal gel   Drink lots of water  AZO- may purchase over the counter if burning gets really bad

## 2022-05-17 ENCOUNTER — Ambulatory Visit: Payer: Medicare Other

## 2022-05-17 ENCOUNTER — Ambulatory Visit (INDEPENDENT_AMBULATORY_CARE_PROVIDER_SITE_OTHER): Payer: Medicare Other | Admitting: Podiatry

## 2022-05-17 DIAGNOSIS — B070 Plantar wart: Secondary | ICD-10-CM | POA: Diagnosis not present

## 2022-05-17 DIAGNOSIS — M7741 Metatarsalgia, right foot: Secondary | ICD-10-CM

## 2022-05-17 DIAGNOSIS — M76821 Posterior tibial tendinitis, right leg: Secondary | ICD-10-CM

## 2022-05-17 DIAGNOSIS — M7742 Metatarsalgia, left foot: Secondary | ICD-10-CM

## 2022-05-17 DIAGNOSIS — M76822 Posterior tibial tendinitis, left leg: Secondary | ICD-10-CM

## 2022-05-17 MED ORDER — IMIQUIMOD 5 % EX CREA: TOPICAL_CREAM | CUTANEOUS | 0 refills | Status: DC

## 2022-05-17 NOTE — Patient Instructions (Signed)
For instructions on how to put on your Tri-Lock Ankle Brace, please visit [email or web address]  ---  Posterior Tibial Tendon Tear Rehab Ask your health care provider which exercises are safe for you. Do exercises exactly as told by your health care provider and adjust them as directed. It is normal to feel mild stretching, pulling, tightness, or discomfort as you do these exercises. Stop right away if you feel sudden pain or your pain gets worse. Do not begin these exercises until told by your health care provider. Stretching and range-of-motion exercises These exercises warm up your muscles and joints and improve the movement and flexibility of your ankle. These exercises also help to relieve pain, numbness, and tingling. Gastroc stretch  Sit on the floor with your left / right leg extended. Loop a belt or towel around ball of your left / right foot. The ball of your foot is on the walking surface, right under your toes. Keep your left / right ankle and foot relaxed and keep your knee straight while you use the belt or towel to pull your foot and ankle toward you. You should feel a gentle stretch behind your calf or knee (gastrocnemius). Hold this position for __________ seconds. Repeat __________ times. Complete this exercise __________ times a day. Active ankle dorsiflexion and plantar flexion  Sit with your left / right knee straight or bent. Do not rest your foot on anything. Flex your left / right ankle to tilt the top of your foot toward your shin (dorsiflexion). Hold this position for __________ seconds. Point your toes downward to tilt the top of your foot away from your shin (plantar flexion). Hold this position for __________ seconds. Repeat __________ times with your knee straight and __________ times with your knee bent. Complete this exercise __________ times a day. Passive ankle plantar flexion  Sit with your left / right leg crossed over your opposite knee. With your  left / right hand, pull the front of your foot and toes toward you (plantar flexion). You should feel a gentle stretch on the top of your foot and ankle. Hold this position for __________ seconds. Repeat __________ times. Complete this exercise __________ times a day. Passive ankle eversion  Sit with your left / right ankle crossed over your opposite knee. With your left / right hand, hold your foot so that your thumb is on the top of your foot and your fingers are on the bottom of your foot. Gently push and twist your ankle downward (eversion) so the smallest toes rise slightly toward the ceiling. You should feel a gentle stretch on the inside of your ankle. Hold this stretch for __________ seconds. Repeat __________ times. Complete this exercise __________ times a day. Passive ankle inversion  Sit with your left / right ankle crossed over your opposite knee. With your left / right hand, hold your foot so that your thumb is on the bottom of your foot and your fingers are across the top of your foot. Gently pull and twist your foot so the smallest toe comes toward you (inversion). You should feel a gentle stretch on the outside of your ankle. Hold the stretch for __________ seconds. Repeat __________ times. Complete this exercise __________ times a day. Ankle alphabet  Sit with your left / right leg supported at the lower leg. Do not rest your foot on anything. Make sure your foot has room to move freely. Think of your left / right foot as a paintbrush, and move your foot to   trace each letter of the alphabet in the air. Keep your hip and knee still while you trace. Trace every letter of the alphabet. Repeat __________ times. Complete this exercise __________ times a day. Strengthening exercises These exercises build strength and endurance in your lower leg. Endurance is the ability to use your muscles for a long time, even after they get tired. Dorsiflexion  Secure a rubber exercise band  or tube to an object that will not move if it is pulled on, such as a table leg. Secure the other end of the band around your left / right foot. Sit on the floor, facing the object with your left / right leg extended. The band or tube should be slightly tense when your foot is relaxed. Slowly flex your left / right ankle and toes to bring your foot toward you (dorsiflexion). Hold this position for __________ seconds. Let the band or tube slowly pull your foot back to the starting position. Repeat __________ times. Complete this exercise __________ times a day. Plantar flexion while seated  Sit on the floor with your left / right leg extended. Loop a rubber exercise band or tube around the ball of your left / right foot. The ball of your foot is on the walking surface, right under your toes. The band or tube should be slightly tense when your foot is relaxed. Slowly point your toes downward, pushing them away from you (plantar flexion). Hold this position for __________ seconds. Let the band or tube slowly pull your foot back to the starting position. Repeat __________ times. Complete this exercise __________ times a day. Towel curls  Sit in a chair on a non-carpeted surface, and put your feet on the floor. Place a towel in front of your feet. If told by your health care provider, add __________ to the end of the towel. Keeping your heel on the floor, put your left / right foot on the towel. Pull the towel toward you by grabbing the towel with your toes and curling them under. Keep your heel on the floor. Repeat __________ times. Complete this exercise __________ times a day. This information is not intended to replace advice given to you by your health care provider. Make sure you discuss any questions you have with your health care provider. Document Revised: 02/12/2019 Document Reviewed: 12/09/2018 Elsevier Patient Education  2023 [employer].  

## 2022-05-19 NOTE — Progress Notes (Signed)
Subjective: 71 year old female presents the office today for concerns of left foot pain started a week ago.  The right foot is doing better.  The left foot describing a stabbing sensation with warm medial aspect the foot.  She tried resting, icing as well as over-the-counter medications.  She points on the medial aspect of the foot where she has the majority discomfort.  She is interested in possibly orthotics.  No injuries that she reports.  She was previously seen by dermatology for the warts but she is no longer seeing them.  She was using a compound cream previously.  No other concerns.  Objective: AAO x3, NAD DP/PT pulses palpable bilaterally, CRT less than 3 seconds Decreased medial arch height.  The majority tenderness is localized along the navicular tuberosity along the distal portion of the posterior tibial tendon along the insertion.  Clinically the tendon appears to be intact.  There is no edema, erythema.  There is no area pinpoint tenderness.  Negative sign.  MMT 5/5. Hyperkeratotic lesions submetatarsal 5 on the fifth toes bilaterally.  There is capillary budding evidence of verruca.  No edema, erythema. No pain with calf compression, swelling, warmth, erythema  Assessment: Posterior tibial tendon dysfunction, insertional tendinitis;   Plan: -All treatment options discussed with the patient including all alternatives, risks, complications.  -X-rays obtained reviewed left foot.  3 views of the left foot were obtained.  No evidence of acute fracture. -Tri-Lock ankle brace dispensed to help aid in soft tissue swelling, support. -We discussed orthotics.  She was measured for orthotics today. -Tylenol as needed as well as Voltaren gel topically -For the warts prescribed Aldara cream.  -Patient encouraged to call the office with any questions, concerns, change in symptoms.

## 2022-05-29 ENCOUNTER — Other Ambulatory Visit: Payer: Self-pay | Admitting: Orthopedic Surgery

## 2022-05-29 DIAGNOSIS — E7800 Pure hypercholesterolemia, unspecified: Secondary | ICD-10-CM

## 2022-06-19 ENCOUNTER — Ambulatory Visit (INDEPENDENT_AMBULATORY_CARE_PROVIDER_SITE_OTHER): Payer: Medicare Other

## 2022-06-19 DIAGNOSIS — M7741 Metatarsalgia, right foot: Secondary | ICD-10-CM

## 2022-06-19 NOTE — Progress Notes (Signed)
Patient presents today to pick up custom molded foot orthotics, diagnosed with metatarsalgia by Dr. Jacqualyn Posey.   Orthotics were dispensed and fit was satisfactory. Reviewed instructions for break-in and wear. Written instructions given to patient.  Patient will follow up as needed.   [facility] - order # [id number]

## 2022-06-26 ENCOUNTER — Other Ambulatory Visit: Payer: Self-pay | Admitting: Orthopedic Surgery

## 2022-06-27 ENCOUNTER — Other Ambulatory Visit: Payer: Medicare Other

## 2022-08-30 IMAGING — MG MM DIGITAL SCREENING BILAT W/ TOMO AND CAD
6 of 10 series · 6 of 30 positions shown · non-contrast
Comparison: Previous exam(s).

CLINICAL DATA: Screening.

EXAM:
DIGITAL SCREENING BILATERAL MAMMOGRAM WITH TOMOSYNTHESIS AND CAD
TECHNIQUE: Bilateral screening digital craniocaudal and mediolateral oblique
mammograms were obtained. Bilateral screening digital breast
tomosynthesis was performed. The images were evaluated with
computer-aided detection.

[L CC synth-2D (1 of 2)]
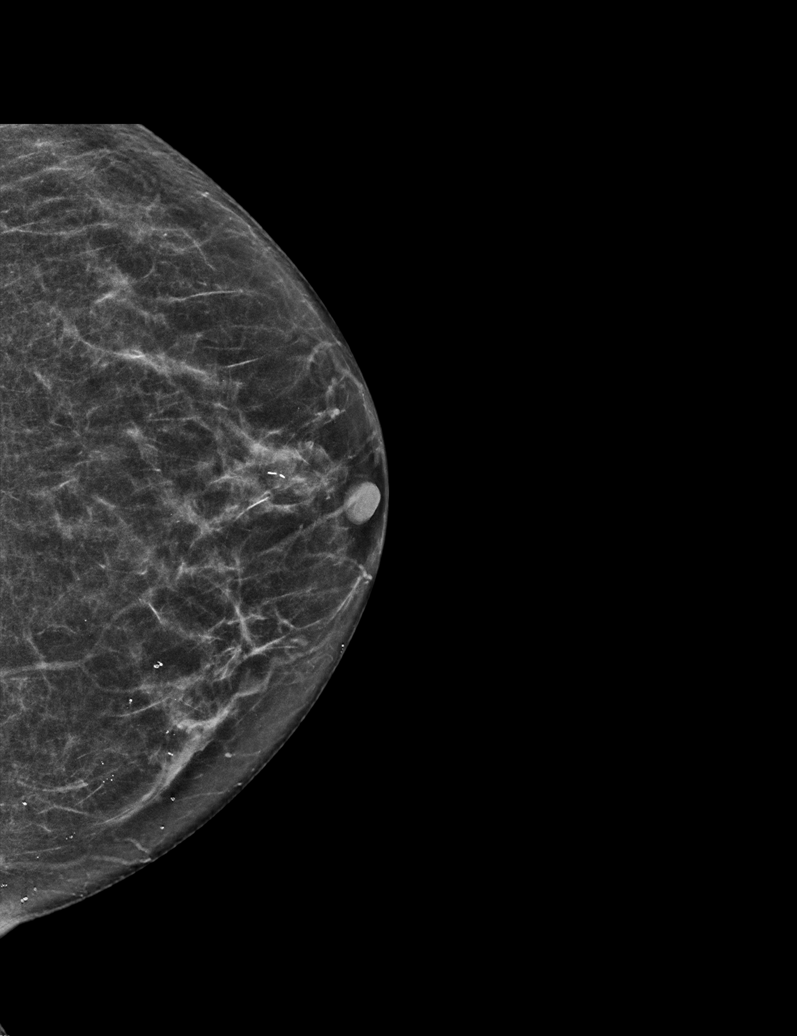

[L CC synth-2D (2 of 2)]
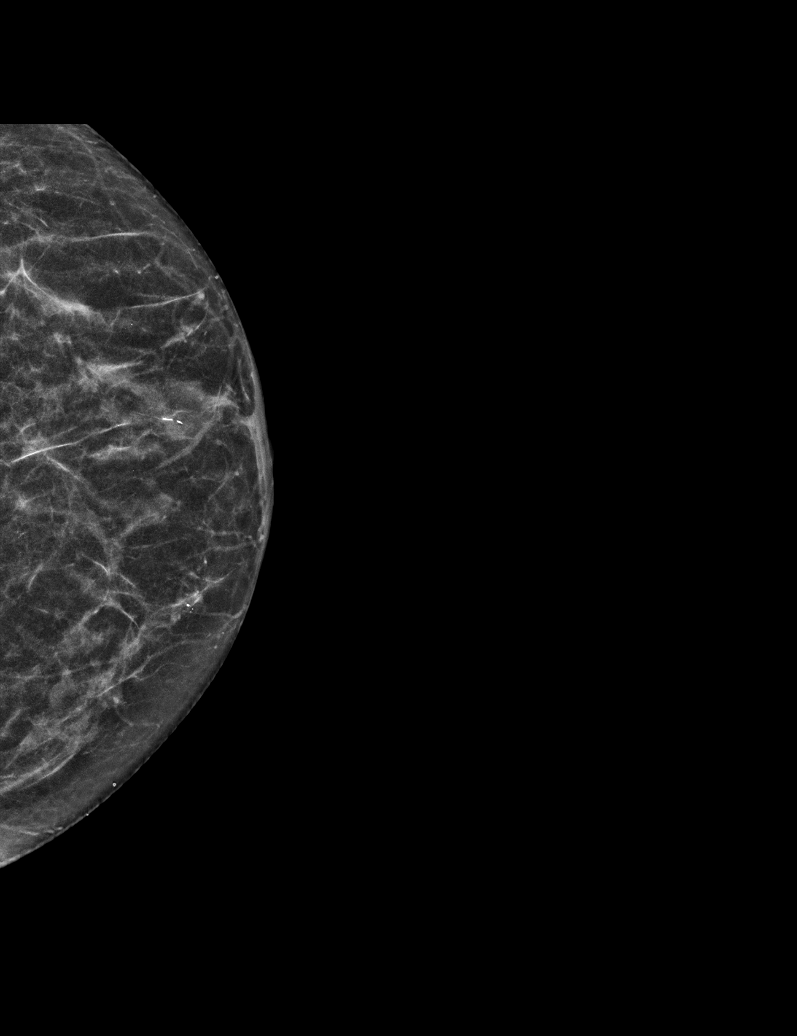

[L MLO synth-2D]
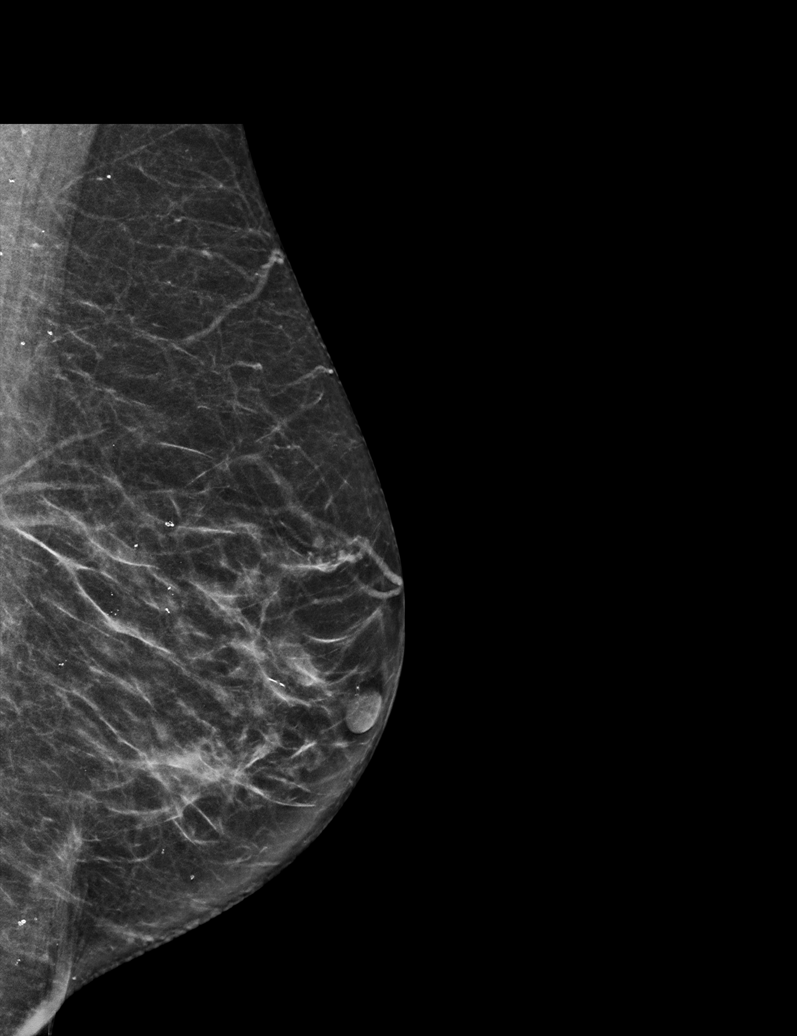

[R CC synth-2D]
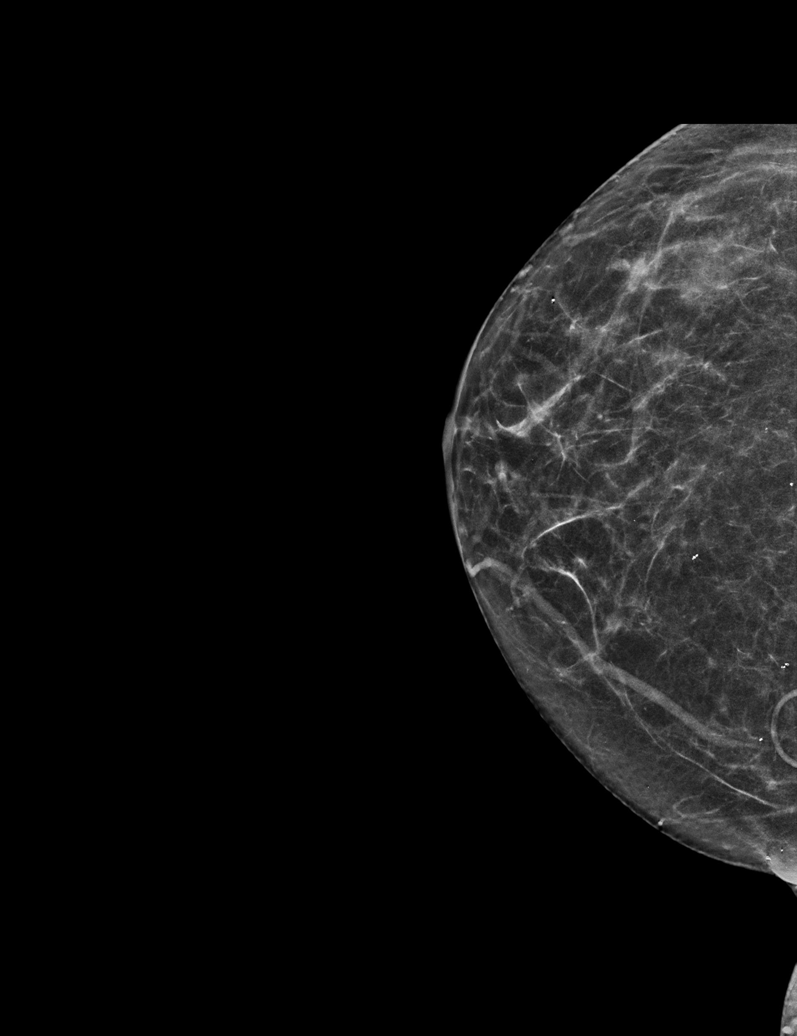

[R MLO synth-2D]
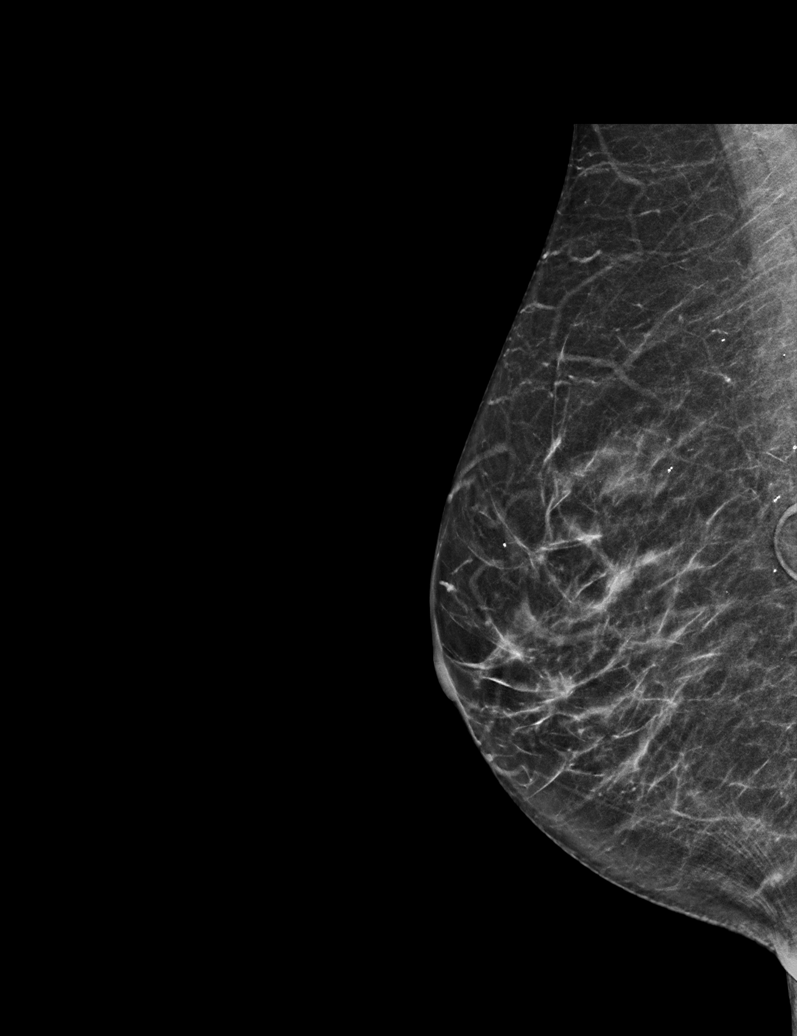

[R CC tomo · tomo slice 27/54.0]
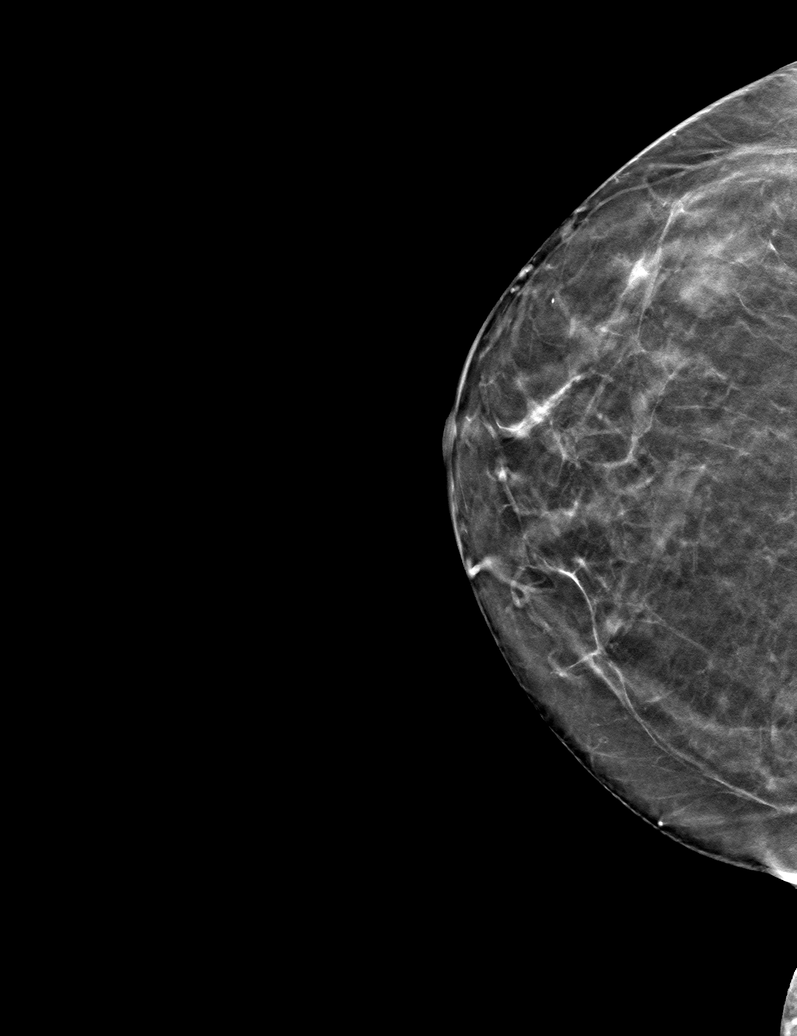

[6 of 30 positions shown; findings below may reference images not displayed]

ACR Breast Density Category b: There are scattered areas of
fibroglandular density.
FINDINGS: There are no findings suspicious for malignancy.
IMPRESSION: No mammographic evidence of malignancy. A result letter of this
screening mammogram will be mailed directly to the patient.

RECOMMENDATION:
Screening mammogram in one year. (Code:51-O-LD2)

BI-RADS CATEGORY  1: Negative.

## 2022-09-04 ENCOUNTER — Encounter: Payer: Self-pay | Admitting: Family Medicine

## 2022-09-04 ENCOUNTER — Ambulatory Visit (INDEPENDENT_AMBULATORY_CARE_PROVIDER_SITE_OTHER): Payer: Medicare Other | Admitting: Family Medicine

## 2022-09-04 VITALS — BP 130/70 | HR 52 | Temp 97.8°F | Ht 60.5 in | Wt 133.1 lb

## 2022-09-04 DIAGNOSIS — M79641 Pain in right hand: Secondary | ICD-10-CM | POA: Diagnosis not present

## 2022-09-04 DIAGNOSIS — K2100 Gastro-esophageal reflux disease with esophagitis, without bleeding: Secondary | ICD-10-CM | POA: Diagnosis not present

## 2022-09-04 DIAGNOSIS — I10 Essential (primary) hypertension: Secondary | ICD-10-CM | POA: Diagnosis not present

## 2022-09-04 DIAGNOSIS — M810 Age-related osteoporosis without current pathological fracture: Secondary | ICD-10-CM

## 2022-09-04 DIAGNOSIS — R918 Other nonspecific abnormal finding of lung field: Secondary | ICD-10-CM

## 2022-09-04 NOTE — Progress Notes (Signed)
New Patient Office Visit  Subjective:  Patient ID: Holly Russell, female    DOB: 1951-02-22  Age: 71 y.o. MRN: [id number]  CC:  Chief Complaint  Patient presents with   Establish Care    Need new pcp, wanted a MD      HPI Holly Russell presents for new pt.  Wanted MD.  Dorie Rank here 2 yrs ago to be closer to grand daughter.  HTN-Pt is on lisinopril.  Bp's running  130/70  No ha/dizziness/cp/palp/edema/cough/sob  R hand, some stiffness some fingers for sev yrs.  Will get stuck now and lumps-dad and bro both have Dupuytren's contractures.   GERD-not too bad, but cp when flares.  Has been w/u in ER. Takes pepcid daily and breakthru about every 2 wks. Glass of water helps.occurs sitting.  If drinks coffee at HS, reflux. No dysphagia.   Chronic constipation.  Coffee 1-2 mugs/day. Osteoporosis-new dx.  Taking calcium/D-caltrate.  Walks regularly since 2018.  Considering endo. Lung nodules - had hematuria and told present.  Repeat in May-pt husb smoked, pt never did.    Past Medical History:  Diagnosis Date   Acid reflux    Agatston CAC score 100-199    Basal cell carcinoma    Cataract    Coronary atherosclerosis    Deviated nasal septum    Eczema    Gastroesophageal reflux disease    H/O bone density study 2019-2015   H/O echocardiogram 2015   H/O exercise stress test 3/4-5 2015   Hearing loss    High blood pressure    High cholesterol    History of chest pain    History of prediabetes    Hx of mammogram 2018-2017-2016-2015-2014   Hyperlipidemia    IBS (irritable bowel syndrome)    Intervertebral disc disease    Iron deficiency anemia    On statin therapy    Osteoarthritis    Osteopenia    unspecified  Location   SNHL (sensorineural hearing loss)    Spinal stenosis    Tinnitus of left ear    Vitamin D deficiency     Past Surgical History:  Procedure Laterality Date   ENDOMETRIAL BIOPSY     EYE SURGERY     lazy eye when 71yo   MOHS SURGERY     for basal cell to  the right of right eye   TONSILLECTOMY      Family History  Problem Relation Age of Onset   Stroke Mother 72   Basal cell carcinoma Mother    Hypertension Mother    Basal cell carcinoma Father    Hypertension Father    Varicose Veins Father    Breast cancer Paternal Aunt        21s   Heart attack Paternal Grandfather    Heart disease Paternal Grandfather    Breast cancer Cousin        30s-40s   Cancer Maternal Grandfather    Depression Paternal Grandmother    Cancer Paternal Aunt     Social History   Socioeconomic History   Marital status: Married    Spouse name: Not on file   Number of children: 2   Years of education: Not on file   Highest education level: Not on file  Occupational History   Not on file  Tobacco Use   Smoking status: Never   Smokeless tobacco: Never  Vaping Use   Vaping Use: Never used  Substance and Sexual Activity   Alcohol use:  Yes    Alcohol/week: 3.0 standard drinks of alcohol    Types: 3 Glasses of wine per week    Comment: 3-4   Drug use: Never   Sexual activity: Not Currently    Partners: Male    Birth control/protection: Post-menopausal  Other Topics Concern   Not on file  Social History Narrative   Diet: Plant Based and Fish      Do you drink/ eat things with caffeine?  Yes      Marital status:  Married       2 children, 1 grand            What year were you married ? 1976      Do you live in a house, apartment,assistred living, condo, trailer, etc.)? Condo-Town House      Is it one or more stories? 2      How many persons live in your home ? 2      Do you have any pets in your home ?(please list) No      Highest Level of education completed: Law School      Current or past profession: Attorney      Do you exercise?     Yes                         Type & how often Walk-3 miles daily      ADVANCED DIRECTIVES (Please bring copies)      Do you have a living will? Yes      Do you have a DNR form?   Yes                     If not, do you want to discuss one?       Do you have signed POA?HPOA forms?  Yes               If so, please bring to your appointment      FUNCTIONAL STATUS- To be completed by Spouse / child / Staff       Do you have difficulty bathing or dressing yourself ?  No      Do you have difficulty preparing food or eating ?  No      Do you have difficulty managing your mediation ?  No      Do you have difficulty managing your finances ?  No      Do you have difficulty affording your medication ?  No      Social Determinants of Health   Financial Resource Strain: Low Risk  (03/07/2022)   Overall Financial Resource Strain (CARDIA)    Difficulty of Paying Living Expenses: Not hard at all  Food Insecurity: No Food Insecurity (03/07/2022)   Hunger Vital Sign    Worried About Running Out of Food in the Last Year: Never true    Ran Out of Food in the Last Year: Never true  Transportation Needs: No Transportation Needs (03/07/2022)   PRAPARE - Hydrologist (Medical): No    Lack of Transportation (Non-Medical): No  Physical Activity: Sufficiently Active (03/07/2022)   Exercise Vital Sign    Days of Exercise per Week: 6 days    Minutes of Exercise per Session: 70 min  Stress: Stress Concern Present (03/07/2022)   [place]    Feeling of Stress : To some extent  Social Connections: Moderately Isolated (03/07/2022)   Social Connection and Isolation Panel [NHANES]    Frequency of Communication with Friends and Family: Twice a week    Frequency of Social Gatherings with Friends and Family: Once a week    Attends Religious Services: Never    Marine scientist or Organizations: No    Attends Archivist Meetings: Never    Marital Status: Married  Human resources officer Violence: Not At Risk (03/07/2022)   Humiliation, Afraid, Rape, and Kick questionnaire    Fear of Current or Ex-Partner: No     Emotionally Abused: No    Physically Abused: No    Sexually Abused: No    ROS  ROS: Gen: no fever, chills  Skin: no rash, itching ENT: no ear pain, ear drainage, nasal congestion, rhinorrhea, sinus pressure, sore throat.  HOH Eyes: no blurry vision, double vision Resp: no cough, wheeze,SOB CV: no CP, palpitations, LE edema,  GI: HPI GU: no dysuria, urgency, frequency, hematuria MSK: HPI Neuro: no dizziness, headache, weakness, vertigo Psych: no depression, anxiety, insomnia, SI   Objective:   Today's Vitals: BP 130/70   Pulse (!) 52   Temp 97.8 F (36.6 C) (Temporal)   Ht 5' 0.5" (1.537 m)   Wt 133 lb 2 oz (60.4 kg)   SpO2 99%   BMI 25.57 kg/m   Physical Exam  Gen: WDWN NAD WF HEENT: NCAT, conjunctiva not injected, sclera nonicteric NECK:  supple, no thyromegaly, no nodes, no carotid bruits CARDIAC: RRR, S1S2+, no murmur. DP 2+B LUNGS: CTAB. No wheezes ABDOMEN:  BS+, soft, NTND, No HSM, no masses EXT:  no edema MSK: no gross abnormalities.  NEURO: A&O x3.  CN II-XII intact.  PSYCH: normal mood. Good eye contact   Reviewed Ct scans, some records, DXA-45 min w/pt in record review, history/exam, plan, discussion.   Assessment & Plan:   Problem List Items Addressed This Visit       Cardiovascular and Mediastinum   Essential (primary) hypertension - Primary     Digestive   Gastroesophageal reflux disease with esophagitis without hemorrhage     Musculoskeletal and Integument   Age-related osteoporosis without current pathological fracture   Other Visit Diagnoses     Right hand pain       Lung nodules         1.  Hypertension-chronic.  Well-controlled.  Continue lisinopril 10 mg. 2.  Right hand pain-Dupuytren's contractures.  Advised to follow-up with sports medicine (number given) 3.  GERD-chronic.  Not well controlled.  Taking Pepcid 20 mg daily.  She has concerns about PPIs.  Advised to keep a log as it may be flaring more due to what she is doing.   Advised decrease caffeine/coffee to 1/day or at least do half calf.  Monitor the bad days and see if related to Posta sauce, other foods/drinks.  Consider increasing Pepcid to twice daily.  She will follow-up in 1 month.  No red flags 4.  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  We discussed different medications, however given longstanding GERD, I do not think she is an oral bisphosphonate candidate.  She is concerned about side effects of the medications.  Will refer to endocrinology (aware that this may take several months) she will continue walking daily.  Increase calcium/vitamin D to twice daily (she had not been taking them at all until fairly recently) 5.  Lung nodules-reviewed CT report from May.  There is a bit of confusion as to follow-up protocol.  I think the  2-year follow-up is what was intended.  She had a CT in 2022 through urology which noticed the nodules, then the repeat was not done until about a year later.  Given the stability, I recommend going with the 2-year follow-up.   Outpatient Encounter Medications as of 09/04/2022  Medication Sig   Acetaminophen (TYLENOL EXTRA STRENGTH PO) Take by mouth as needed.   aspirin EC 81 MG tablet Take 81 mg by mouth every Monday, Wednesday, and Friday. Swallow whole.   atorvastatin (LIPITOR) 40 MG tablet TAKE 1 TABLET BY MOUTH EVERY DAY   Calcium Carb-Cholecalciferol (CALTRATE 600+D3 PO) Take 600 mLs by mouth daily.   clobetasol ointment (TEMOVATE) 0.05 % Apply topically as needed.   clotrimazole (LOTRIMIN) 1 % cream as needed.   famotidine (PEPCID) 20 MG tablet TAKE 1 TABLET BY MOUTH EVERY DAY   lisinopril (ZESTRIL) 10 MG tablet TAKE 1 TABLET BY MOUTH EVERY DAY   Polyethyl Glycol-Propyl Glycol (SYSTANE) 0.4-0.3 % SOLN as needed.   vitamin B-12 (CYANOCOBALAMIN) 1000 MCG tablet Take 1,000 mcg by mouth as needed.   [DISCONTINUED] cholecalciferol (VITAMIN D) 25 MCG (1000 UNIT) tablet Take 1,000 Units by mouth as needed.   [DISCONTINUED]  imiquimod (ALDARA) 5 % cream Apply topically 3 (three) times a week.   No facility-administered encounter medications on file as of 09/04/2022.    Follow-up: Return in about 4 weeks (around 10/02/2022) for 1-2 mo annual/labs.   Wellington Hampshire, MD

## 2022-09-04 NOTE — Patient Instructions (Addendum)
Welcome to [facility]! It was a pleasure meeting you today.  As discussed, Please schedule a 1-2 month follow up visit today.  Sports med [phone] or schedule online-Dr. Corey-for hand  Increase calcium/D to twice daily  Colace '100mg'$  daily for constipation, benefiber  Log reflux and what causes.  Decrease coffee or do 1/2 caf.  Get shingles immunization at pharm Get Tdap   PLEASE NOTE:  If you had any LAB tests please let us know if you have not heard back within a few days. You may see your results on MyChart before we have a chance to review them but we will give you a call once they are reviewed by [place]. If we ordered any REFERRALS today, please let us know if you have not heard from their office within the next week.  Let us know through MyChart if you are needing REFILLS, or have your pharmacy send [place] the request. You can also use MyChart to communicate with me or any office staff.  Please try these tips to maintain a healthy lifestyle:  Eat most of your calories during the day when you are active. Eliminate processed foods including packaged sweets (pies, cakes, cookies), reduce intake of potatoes, white bread, white pasta, and white rice. Look for whole grain options, oat flour or almond flour.  Each meal should contain half fruits/vegetables, one quarter protein, and one quarter carbs (no bigger than a computer mouse).  Cut down on sweet beverages. This includes juice, soda, and sweet tea. Also watch fruit intake, though this is a healthier sweet option, it still contains natural sugar! Limit to 3 servings daily.  Drink at least 1 glass of water with each meal and aim for at least 8 glasses per day  Exercise at least 150 minutes every week.

## 2022-09-11 ENCOUNTER — Telehealth: Payer: Self-pay | Admitting: *Deleted

## 2022-09-11 NOTE — Telephone Encounter (Signed)
Patient is calling because she had  received a bill for which she believes is for an xray , did not have done. Looking at the notes an xray was done but it listed as no show in appt.notes.I transferred to [facility] for clarification.

## 2022-09-11 NOTE — Telephone Encounter (Signed)
This was likely a mistake and I will look at it in the morning. Thanks!

## 2022-09-12 NOTE — Progress Notes (Signed)
I, Peterson Lombard, LAT, ATC acting as a scribe for Lynne Leader, MD.  Subjective:    CC: R hand pain  HPI: Pt is a 71 y/o female c/o R hand pain ongoing for several years, worsening over the last few months. Pt moved to [place] 2-years ago, from [place]. Pt locates pain to the 3rd-4th fingers, w/ intermittent triggering, w/ a "knot" along the palmar aspect of the MCP joints.  R hand swelling: no Grip strength: normal Paresthesias: no Aggravates: nothing in particular Treatments tried: none  Pertinent review of Systems: No fevers or chills  Relevant historical information: Family history of Dupuytren's contracture   Objective:    Vitals:   09/13/22 0927  BP: 126/78  Pulse: (!) 55  SpO2: 99%   General: Well Developed, well nourished, and in no acute distress.   MSK: Right hand degenerative changes present across MCPs and IP joints. Small palmar nodule present at fourth palmar metacarpal.  This is nontender. Some triggering is present with flexion of the third and fourth digits with palpable mobile nodules at palmar MCP at A1 pulley area. Intact strength.  Lab and Radiology Results  Procedure: Real-time Ultrasound Guided Injection of right third MCP A1 pulley tendon sheath (trigger finger injection) Device: Philips Affiniti 50G Images permanently stored and available for review in PACS Verbal informed consent obtained.  Discussed risks and benefits of procedure. Warned about infection, bleeding, hyperglycemia damage to structures among others. Patient expresses understanding and agreement Time-out conducted.   Noted no overlying erythema, induration, or other signs of local infection.   Skin prepped in a sterile fashion.   Local anesthesia: Topical Ethyl chloride.   With sterile technique and under real time ultrasound guidance: 20 mg of Kenalog and 0.5 mL of lidocaine injected into tendon sheath at A1 pulley. Fluid seen entering the tendon sheath.   Completed  without difficulty   Pain immediately resolved suggesting accurate placement of the medication.   Advised to call if fevers/chills, erythema, induration, drainage, or persistent bleeding.   Images permanently stored and available for review in the ultrasound unit.  Impression: Technically successful ultrasound guided injection.    Procedure: Real-time Ultrasound Guided Injection of right fourth MCP A1 pulley tendon sheath and ganglion cyst (trigger finger injection) Device: Philips Affiniti 50G Images permanently stored and available for review in PACS Ultrasound evaluation prior to injection shows a small ganglion cyst associated with the flexor tendon sheath at the fourth MCP A1 pulley area Verbal informed consent obtained.  Discussed risks and benefits of procedure. Warned about infection, bleeding, hyperglycemia damage to structures among others. Patient expresses understanding and agreement Time-out conducted.   Noted no overlying erythema, induration, or other signs of local infection.   Skin prepped in a sterile fashion.   Local anesthesia: Topical Ethyl chloride.   With sterile technique and under real time ultrasound guidance: 20 mg of Kenalog and 0.5 mL of lidocaine injected into tendon sheath at A1 pulley and ganglion cyst. Fluid seen entering the tendon sheath ganglion cyst.   Completed without difficulty   Pain immediately resolved suggesting accurate placement of the medication.   Advised to call if fevers/chills, erythema, induration, drainage, or persistent bleeding.   Images permanently stored and available for review in the ultrasound unit.  Impression: Technically successful ultrasound guided injection.        Impression and Recommendations:    Assessment and Plan: 71 y.o. female with right hand pain and triggering primarily due to trigger finger third and  fourth digits.  She does have evidence of mild Dupuytren's contracture which I do not think is very  symptomatic at this time.  Plan for injection as above, double Band-Aid splint, Voltaren gel, and referral to hand therapy.Marland Kitchen  PDMP not reviewed this encounter. Orders Placed This Encounter  Procedures   [place] LIMITED JOINT SPACE STRUCTURES UP RIGHT(NO LINKED CHARGES)    Order Specific Question:   Reason for Exam (SYMPTOM  OR DIAGNOSIS REQUIRED)    Answer:   right hand pain    Order Specific Question:   Preferred imaging location?    Answer:   [facility]   Ambulatory referral to Occupational Therapy    Referral Priority:   Routine    Referral Type:   Occupational Therapy    Referral Reason:   Specialty Services Required    Requested Specialty:   Occupational Therapy    Number of Visits Requested:   1   No orders of the defined types were placed in this encounter.   Discussed warning signs or symptoms. Please see discharge instructions. Patient expresses understanding.   The above documentation has been reviewed and is accurate and complete Lynne Leader, M.D.

## 2022-09-13 ENCOUNTER — Ambulatory Visit: Payer: Medicare Other

## 2022-09-13 ENCOUNTER — Ambulatory Visit (INDEPENDENT_AMBULATORY_CARE_PROVIDER_SITE_OTHER): Payer: Medicare Other | Admitting: Family Medicine

## 2022-09-13 VITALS — BP 126/78 | HR 55 | Ht 60.5 in | Wt 136.0 lb

## 2022-09-13 DIAGNOSIS — M65341 Trigger finger, right ring finger: Secondary | ICD-10-CM

## 2022-09-13 DIAGNOSIS — M79641 Pain in right hand: Secondary | ICD-10-CM | POA: Diagnosis not present

## 2022-09-13 DIAGNOSIS — M720 Palmar fascial fibromatosis [Dupuytren]: Secondary | ICD-10-CM

## 2022-09-13 DIAGNOSIS — M6530 Trigger finger, unspecified finger: Secondary | ICD-10-CM

## 2022-09-13 DIAGNOSIS — M65331 Trigger finger, right middle finger: Secondary | ICD-10-CM

## 2022-09-13 NOTE — Patient Instructions (Addendum)
Thank you for coming in today.   You received an injection today. Seek immediate medical attention if the joint becomes red, extremely painful, or is oozing fluid.    I've referred you to Occupational Therapy.  Let us know if you don't hear from them in one week.   Recheck with me as needed.

## 2022-09-24 ENCOUNTER — Encounter: Payer: Self-pay | Admitting: Rehabilitative and Restorative Service Providers"

## 2022-09-24 ENCOUNTER — Other Ambulatory Visit: Payer: Self-pay

## 2022-09-24 ENCOUNTER — Ambulatory Visit (INDEPENDENT_AMBULATORY_CARE_PROVIDER_SITE_OTHER): Payer: Medicare Other | Admitting: Rehabilitative and Restorative Service Providers"

## 2022-09-24 DIAGNOSIS — M25641 Stiffness of right hand, not elsewhere classified: Secondary | ICD-10-CM | POA: Diagnosis not present

## 2022-09-24 DIAGNOSIS — R600 Localized edema: Secondary | ICD-10-CM

## 2022-09-24 DIAGNOSIS — M25541 Pain in joints of right hand: Secondary | ICD-10-CM

## 2022-09-24 DIAGNOSIS — R278 Other lack of coordination: Secondary | ICD-10-CM

## 2022-09-24 DIAGNOSIS — M6281 Muscle weakness (generalized): Secondary | ICD-10-CM | POA: Diagnosis not present

## 2022-09-24 NOTE — Therapy (Signed)
OUTPATIENT OCCUPATIONAL THERAPY ORTHO EVALUATION  Patient Name: Holly Russell MRN: [id number] DOB:[birth year], 71 y.o., female Today's Date: 09/24/2022  PCP: Tawnya Crook, MD REFERRING PROVIDER:  Gregor Hams, MD    END OF SESSION:  OT End of Session - 09/24/22 0933     Visit Number 1    Number of Visits 8    Date for OT Re-Evaluation 10/25/22    Authorization Type Medicare and BCBS suppliment    Progress Note Due on Visit 10    OT Start Time [id number]    OT Stop Time 1041    OT Time Calculation (min) 67 min    Equipment Utilized During Treatment n/a    Activity Tolerance Patient tolerated treatment well;No increased pain;Patient limited by fatigue    Behavior During Therapy [facility] for tasks assessed/performed;Anxious             Past Medical History:  Diagnosis Date   Acid reflux    Agatston CAC score 100-199    Basal cell carcinoma    Cataract    Coronary atherosclerosis    Deviated nasal septum    Eczema    Gastroesophageal reflux disease    H/O bone density study 2019-2015   H/O echocardiogram 2015   H/O exercise stress test 3/4-5 2015   Hearing loss    High blood pressure    High cholesterol    History of chest pain    History of prediabetes    Hx of mammogram 2018-2017-2016-2015-2014   Hyperlipidemia    IBS (irritable bowel syndrome)    Intervertebral disc disease    Iron deficiency anemia    On statin therapy    Osteoarthritis    Osteopenia    unspecified  Location   SNHL (sensorineural hearing loss)    Spinal stenosis    Tinnitus of left ear    Vitamin D deficiency    Past Surgical History:  Procedure Laterality Date   ENDOMETRIAL BIOPSY     EYE SURGERY     lazy eye when 71yo   MOHS SURGERY     for basal cell to the right of right eye   TONSILLECTOMY     Patient Active Problem List   Diagnosis Date Noted   Age-related osteoporosis without current pathological fracture 09/04/2022   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 02/19/2021   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 02/19/2021   Urethral caruncle 02/19/2021   Microscopic hematuria 02/07/2021   Essential (primary) hypertension 09/15/2020   Pure hypercholesterolemia 09/15/2020   Spinal stenosis 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 09/15/2020   Gastroesophageal reflux disease with esophagitis without hemorrhage 09/15/2020   Presbycusis of both ears 09/15/2020   Tinnitus of both ears 09/15/2020   Estrogen deficiency 09/15/2020   Age-related cataract of both eyes 09/15/2020   Macular pucker, bilateral 09/15/2020    ONSET DATE: chronic, ~1-2 years, worse in past 6 months   REFERRING DIAG:  M79.641 (ICD-10-CM) - Right hand pain  M65.30 (ICD-10-CM) - Trigger finger, acquired  M72.0 (ICD-10-CM) - Dupuytren's contracture of right hand    THERAPY DIAG:  Localized edema  Other lack of coordination  Muscle weakness (generalized)  Pain in joint of right hand  Stiffness of right hand, not elsewhere classified  Rationale for Evaluation and Treatment: Rehabilitation  SUBJECTIVE:   SUBJECTIVE STATEMENT: She arrives holding her right hand outstretched obviously nervously.  She states her hand started having pain and problems 2 years ago and she thought it was just tightness.  Only  in the past 6 months did it start really hurting and triggering in her ring finger and middle finger.  She states this runs in her family.  She also states not really knowing what to do now after her injections, but she has not had significant pain or any triggering since injections.   PERTINENT HISTORY: Per MD notes: "right hand pain and triggering primarily due to trigger finger third and fourth digits.  She does have evidence of mild Dupuytren's contracture which I do not think is very symptomatic at this time"  She underwent steroid injection on 09/13/22 which was very successful at the time.   PRECAUTIONS: None  WEIGHT BEARING RESTRICTIONS: No  PAIN:  Are you having pain? Not now at  rest Rating: 0/10 at rest now, up to 1-2/10 at worst in past week   FALLS: Has patient fallen in last 6 months? No  LIVING ENVIRONMENT: Lives with: lives with their spouse   PLOF: Independent  PATIENT GOALS: To get back full use of right nondominant hand   OBJECTIVE: (All objective assessments below are from initial evaluation on: 09/24/22 unless otherwise specified.)   HAND DOMINANCE: Left  ADLs: Overall ADLs: States decreased ability to grab, hold household objects, pain and inability to open containers, perform FMS tasks (manipulate fasteners on clothing)   FUNCTIONAL OUTCOME MEASURES: Eval: Quck DASH 16% impairment today  (Higher % Score  =  More Impairment)     UPPER EXTREMITY ROM     Shoulder to Wrist AROM Right eval Left eval  Wrist flexion 72 71  Wrist extension 60 68  (Blank rows = not tested)   Hand AROM Right eval  Full Fist Ability (or Gap to Distal Palmar Crease) No, 8cm gap today  Thumb Opposition to Small Finger (or Gap) Yes  Thumb Opposition to Base of Small Finger (or Gap)  Yes  Long MCP (0-90) 0-74   Long PIP (0-100) 0-47   Long DIP (0-70) 0-24   Ring MCP (0-90) 0-58   Ring PIP (0-100) 0-40   Ring DIP (0-70)  0-17  (Blank rows = not tested)   UPPER EXTREMITY MMT:    Eval:  NT at eval due to tenderness and apprehension at eval. Will be tested when appropriate.   MMT Right TBD  Elbow flexion   Elbow extension   Forearm supination   Forearm pronation   Wrist flexion   Wrist extension   Wrist ulnar deviation   Wrist radial deviation   (Blank rows = not tested)  HAND FUNCTION: Eval: Observed weakness in affected Rt hand. NT due to only ~10 days since injections Grip strength Right: TBD lbs, Left: 38.1 lbs   COORDINATION: Eval: Observed coordination impairments with affected hand. Box and Blocks Test: Rt 36 Blocks today (51 is WFL);  SENSATION: Eval:  Light touch intact today  EDEMA:   Eval: No significant swelling today.    COGNITION: Eval: Overall cognitive status: WFL for evaluation today   OBSERVATIONS:   Eval: Tips of RF and MF of Rt hand purple and cool to touch when she arrived.  2 Band-Aids each arm wrapped circumferentially around ring finger and middle finger of right hand, too tightly.  She is holding fingers of Rt hand extremely guarded and straight.  She is nervous at the idea of trying to move her right hand.   TODAY'S TREATMENT:  Post-evaluation treatment: First, OT removes both Band-Aids from each finger and the color returns to her fingertips.  OT then applies  half of the Band-Aid at each fingers PIP joint which is restrictive enough to prevent triggering for her right now.  OT first tries to explain the mechanism of injury and assure her that as long as her MCP joint is straighter she should be able to bend her IP joints.  OT once for about guarding and fear of using her hand, how this could negatively affect her entire right arm.  OT trials this with her, doing hook stretch and she has no trigger but does feel tightness in her dorsum of her hand as it has become tight from intense guarding.  OT also asks her to do all light functional activities at home, as long as they are not hurting her or causing triggering.  Additionally OT assigned for following home exercises to try to get the stiffness out of her hand and make it more mobile without causing pain.  She demonstrates each back well, does state some memory issues and need for practice in upcoming sessions.  Exercises - Reach arms upward   - 4 x daily - 10 reps - Bend and Pull Back Wrist SLOWLY  - 4 x daily - 10-15 reps - Wrist Prayer Stretch  - 4 x daily - 3-5 reps - 15 sec hold - Thumb Opposition  - 4-6 x daily - 10 reps - Tendon Glides  - 4-6 x daily - 3-5 reps - 2-3 seconds hold - HOOK Stretch  - 4 x daily - 3-5 reps - 15-20 sec hold   PATIENT EDUCATION: Education details: See tx section above for details  Person educated:  Patient Education method: Veterinary surgeon, Teach back, Handouts  Education comprehension: States and demonstrates understanding, Additional Education required    HOME EXERCISE PROGRAM: Access Code: [email or web address] URL: [email or web address] Date: 09/24/2022 Prepared by: Benito Mccreedy   GOALS: Goals reviewed with patient? Yes   SHORT TERM GOALS: (STG required if POC>30 days) Target Date: 10/11/22  Pt will obtain protective, custom orthotic. Goal status: TBD/PRN  2.  Pt will demo/state understanding of initial HEP to improve pain levels and prerequisite motion. Goal status: INITIAL   LONG TERM GOALS: Target Date: 10/25/22  Pt will improve functional ability by decreased impairment per Quick DASH assessment from 16% to 10% or better, for better quality of life. Goal status: INITIAL  2.  Pt will improve grip strength in Rt hand to at least 30#lbs for functional use at home and in IADLs. Goal status: INITIAL  3.  Pt will improve A/ROM in Rt MF and RF TAM from 145*/115* respectively to at least 200* each, to have functional motion for tasks like reach and grasp.  Goal status: INITIAL  4.  Pt will improve strength in Rt hand/wrist to at least 4/5 MMT to have increased functional ability to carry out selfcare and higher-level homecare tasks with no difficulty. Goal status: INITIAL  5.  Pt will improve coordination skills in Rt, as seen by better score on Box & Blocks testing to [facility] to have increased functional ability to carry out fine motor tasks (fasteners, etc.) and more complex, coordinated IADLs (meal prep, sports, etc.).  Goal status: INITIAL    ASSESSMENT:  CLINICAL IMPRESSION: Patient is a 71 y.o. female who was seen today for occupational therapy evaluation for painful and stiff right hand triggering third and fourth fingers.  Also decreased functional ability.  She will benefit from occupational therapy treatment to increase functionality.   PERFORMANCE  DEFICITS: in functional skills including ADLs, IADLs, coordination, dexterity,  ROM, strength, pain, fascial restrictions, flexibility, Fine motor control, Gross motor control, body mechanics, endurance, decreased knowledge of precautions, and UE functional use, cognitive skills including memory, problem solving, and safety awareness, and psychosocial skills including coping strategies, environmental adaptation, and habits.   IMPAIRMENTS: are limiting patient from ADLs, IADLs, and leisure.   COMORBIDITIES: has co-morbidities such as HTN, spinal stenosis, tinnitus, cataracts, osteoporosis, and others  that affects occupational performance. Patient will benefit from skilled OT to address above impairments and improve overall function.  MODIFICATION OR ASSISTANCE TO COMPLETE EVALUATION: Min-Moderate modification of tasks or assist with assess necessary to complete an evaluation.  OT OCCUPATIONAL PROFILE AND HISTORY: Problem focused assessment: Including review of records relating to presenting problem.  CLINICAL DECISION MAKING: LOW - limited treatment options, no task modification necessary  REHAB POTENTIAL: Good  EVALUATION COMPLEXITY: Low      PLAN:  OT FREQUENCY: 1-2x/week  OT DURATION: 4 weeks (through 10/25/22 as needed)   PLANNED INTERVENTIONS: self care/ADL training, therapeutic exercise, therapeutic activity, manual therapy, passive range of motion, splinting, fluidotherapy, compression bandaging, moist heat, cryotherapy, contrast bath, patient/family education, and coping strategies training  RECOMMENDED OTHER SERVICES: none now   CONSULTED AND AGREED WITH PLAN OF CARE: Patient  PLAN FOR NEXT SESSION: Check on initial recommendations take new range of motion and functional measures as needed, review home exercise program and upgrade as able.   Benito Mccreedy, OTR/L, CHT 09/24/2022, 11:04 AM

## 2022-09-30 NOTE — Therapy (Signed)
OUTPATIENT OCCUPATIONAL THERAPY TREATMENT NOTE  Patient Name: Holly Russell MRN: [id number] DOB:18-Jan-1951, 71 y.o., female Today's Date: 10/01/2022  PCP: Tawnya Crook, MD REFERRING PROVIDER:  Gregor Hams, MD    END OF SESSION:  OT End of Session - 10/01/22 1109     Visit Number 2    Number of Visits 8    Date for OT Re-Evaluation 10/25/22    Authorization Type Medicare and BCBS suppliment    Progress Note Due on Visit 10    OT Start Time 1109    OT Stop Time 1150    OT Time Calculation (min) 41 min    Equipment Utilized During Treatment n/a    Activity Tolerance Patient tolerated treatment well;No increased pain;Patient limited by fatigue    Behavior During Therapy [facility] for tasks assessed/performed;Anxious              Past Medical History:  Diagnosis Date   Acid reflux    Agatston CAC score 100-199    Basal cell carcinoma    Cataract    Coronary atherosclerosis    Deviated nasal septum    Eczema    Gastroesophageal reflux disease    H/O bone density study 2019-2015   H/O echocardiogram 2015   H/O exercise stress test 3/4-5 2015   Hearing loss    High blood pressure    High cholesterol    History of chest pain    History of prediabetes    Hx of mammogram 2018-2017-2016-2015-2014   Hyperlipidemia    IBS (irritable bowel syndrome)    Intervertebral disc disease    Iron deficiency anemia    On statin therapy    Osteoarthritis    Osteopenia    unspecified  Location   SNHL (sensorineural hearing loss)    Spinal stenosis    Tinnitus of left ear    Vitamin D deficiency    Past Surgical History:  Procedure Laterality Date   ENDOMETRIAL BIOPSY     EYE SURGERY     lazy eye when 71yo   MOHS SURGERY     for basal cell to the right of right eye   TONSILLECTOMY     Patient Active Problem List   Diagnosis Date Noted   Age-related osteoporosis without current pathological fracture 09/04/2022   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 02/19/2021   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 02/19/2021   Urethral caruncle 02/19/2021   Microscopic hematuria 02/07/2021   Essential (primary) hypertension 09/15/2020   Pure hypercholesterolemia 09/15/2020   Spinal stenosis 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 09/15/2020   Gastroesophageal reflux disease with esophagitis without hemorrhage 09/15/2020   Presbycusis of both ears 09/15/2020   Tinnitus of both ears 09/15/2020   Estrogen deficiency 09/15/2020   Age-related cataract of both eyes 09/15/2020   Macular pucker, bilateral 09/15/2020    ONSET DATE: chronic, ~1-2 years, worse in past 6 months   REFERRING DIAG:  M79.641 (ICD-10-CM) - Right hand pain  M65.30 (ICD-10-CM) - Trigger finger, acquired  M72.0 (ICD-10-CM) - Dupuytren's contracture of right hand    THERAPY DIAG:  Localized edema  Other lack of coordination  Muscle weakness (generalized)  Stiffness of right hand, not elsewhere classified  Pain in joint of right hand  Rationale for Evaluation and Treatment: Rehabilitation  PERTINENT HISTORY: Per MD notes: "right hand pain and triggering primarily due to trigger finger third and fourth digits.  She does have evidence of mild Dupuytren's contracture which I do not think is very  symptomatic at this time"  She underwent steroid injection on 09/13/22 which was very successful at the time.  Per eval: "She arrives holding her right hand outstretched obviously nervously.  She states her hand started having pain and problems 2 years ago and she thought it was just tightness.  Only in the past 6 months did it start really hurting and triggering in her ring finger and middle finger.  She states this runs in her family.  She also states not really knowing what to do now after her injections, but she has not had significant pain or any triggering since injections."  PRECAUTIONS: None  WEIGHT BEARING RESTRICTIONS: No   SUBJECTIVE:   SUBJECTIVE STATEMENT: She states ding more with her  right hand now, RF did trigger once, but otherwise MF feels looser now.   PAIN:  Are you having pain? Yes, generally aching through hand, wrist, and forearm  Rating: 2/10 at rest now, up to 5/10 at worst in past week when ring finger locked   FALLS: Has patient fallen in last 6 months? No  LIVING ENVIRONMENT: Lives with: lives with their spouse   PLOF: Independent  PATIENT GOALS: To get back full use of right nondominant hand   OBJECTIVE: (All objective assessments below are from initial evaluation on: 09/24/22 unless otherwise specified.)   HAND DOMINANCE: Left  ADLs: Overall ADLs: States decreased ability to grab, hold household objects, pain and inability to open containers, perform FMS tasks (manipulate fasteners on clothing)   FUNCTIONAL OUTCOME MEASURES: Eval: Quck DASH 16% impairment today  (Higher % Score  =  More Impairment)     UPPER EXTREMITY ROM     Shoulder to Wrist AROM Right eval Left eval  Wrist flexion 72 71  Wrist extension 60 68  (Blank rows = not tested)   Hand AROM Right eval Rt 10/01/22  Full Fist Ability (or Gap to Distal Palmar Crease) No, 8cm gap today No ~7cm gap today (from MF tip)   Thumb Opposition to Small Finger (or Gap) Yes   Thumb Opposition to Base of Small Finger (or Gap)  Yes   Long MCP (0-90) 0-74  0-67  Long PIP (0-100) 0-47  0-33  Long DIP (0-70) 0-24  0-5  Ring MCP (0-90) 0-58  0-61  Ring PIP (0-100) 0-40  0-40  Ring DIP (0-70)  0-17 0-15  (Blank rows = not tested)   UPPER EXTREMITY MMT:    Eval:  NT at eval due to tenderness and apprehension at eval. Will be tested when appropriate.   MMT Right TBD  Elbow flexion   Elbow extension   Forearm supination   Forearm pronation   Wrist flexion   Wrist extension   Wrist ulnar deviation   Wrist radial deviation   (Blank rows = not tested)  HAND FUNCTION: Eval: Observed weakness in affected Rt hand. NT due to only ~10 days since injections Grip strength Right: TBD  lbs, Left: 38.1 lbs   COORDINATION: Eval: Observed coordination impairments with affected hand. Box and Blocks Test: Rt 36 Blocks today (51 is WFL);  SENSATION: Eval:  Light touch intact today  EDEMA:   Eval: No significant swelling today.   COGNITION: Eval: Overall cognitive status: WFL for evaluation today   OBSERVATIONS:   Eval: Tips of RF and MF of Rt hand purple and cool to touch when she arrived.  2 Band-Aids each arm wrapped circumferentially around ring finger and middle finger of right hand, too tightly.  She  is holding fingers of Rt hand extremely guarded and straight.  She is nervous at the idea of trying to move her right hand.   TODAY'S TREATMENT:  10/01/22: She does new AROM showing some progress since last week.  States some confusion with "hook" stretch, so OT does slowly with manual therapy and has her perform back. OT also reviews other exercises with her and she also has apparent proprioception issues with tendon glides.  She then does fnl activities for FMS with pegs and forced Rt hand use, as well as FA strength and wrist strength with green flexbar x10 in supination, x10 in wrist flexion.    Exercises - Reach arms upward   - 4 x daily - 10 reps - Bend and Pull Back Wrist SLOWLY  - 4 x daily - 10-15 reps - Wrist Prayer Stretch  - 4 x daily - 3-5 reps - 15 sec hold - Thumb Opposition  - 4-6 x daily - 10 reps - Tendon Glides  - 4-6 x daily - 3-5 reps - 2-3 seconds hold - HOOK Stretch  - 4 x daily - 3-5 reps - 15-20 sec hold   Post-evaluation treatment: First, OT removes both Band-Aids from each finger and the color returns to her fingertips.  OT then applies half of the Band-Aid at each fingers PIP joint which is restrictive enough to prevent triggering for her right now.  OT first tries to explain the mechanism of injury and assure her that as long as her MCP joint is straighter she should be able to bend her IP joints.  OT once for about guarding and fear of using  her hand, how this could negatively affect her entire right arm.  OT trials this with her, doing hook stretch and she has no trigger but does feel tightness in her dorsum of her hand as it has become tight from intense guarding.  OT also asks her to do all light functional activities at home, as long as they are not hurting her or causing triggering.  Additionally OT assigned for following home exercises to try to get the stiffness out of her hand and make it more mobile without causing pain.  She demonstrates each back well, does state some memory issues and need for practice in upcoming sessions.  Exercises - Reach arms upward   - 4 x daily - 10 reps - Bend and Pull Back Wrist SLOWLY  - 4 x daily - 10-15 reps - Wrist Prayer Stretch  - 4 x daily - 3-5 reps - 15 sec hold - Thumb Opposition  - 4-6 x daily - 10 reps - Tendon Glides  - 4-6 x daily - 3-5 reps - 2-3 seconds hold - HOOK Stretch  - 4 x daily - 3-5 reps - 15-20 sec hold   PATIENT EDUCATION: Education details: See tx section above for details  Person educated: Patient Education method: Veterinary surgeon, Teach back, Handouts  Education comprehension: States and demonstrates understanding, Additional Education required    HOME EXERCISE PROGRAM: Access Code: [email or web address] URL: [email or web address] Date: 09/24/2022 Prepared by: Benito Mccreedy   GOALS: Goals reviewed with patient? Yes   SHORT TERM GOALS: (STG required if POC>30 days) Target Date: 10/11/22  Pt will obtain protective, custom orthotic. Goal status: TBD/PRN  2.  Pt will demo/state understanding of initial HEP to improve pain levels and prerequisite motion. Goal status: INITIAL   LONG TERM GOALS: Target Date: 10/25/22  Pt will improve functional ability by decreased impairment per  Quick DASH assessment from 16% to 10% or better, for better quality of life. Goal status: INITIAL  2.  Pt will improve grip strength in Rt hand to at least 30#lbs for  functional use at home and in IADLs. Goal status: INITIAL  3.  Pt will improve A/ROM in Rt MF and RF TAM from 145*/115* respectively to at least 200* each, to have functional motion for tasks like reach and grasp.  Goal status: INITIAL  4.  Pt will improve strength in Rt hand/wrist to at least 4/5 MMT to have increased functional ability to carry out selfcare and higher-level homecare tasks with no difficulty. Goal status: INITIAL  5.  Pt will improve coordination skills in Rt, as seen by better score on Box & Blocks testing to [facility] to have increased functional ability to carry out fine motor tasks (fasteners, etc.) and more complex, coordinated IADLs (meal prep, sports, etc.).  Goal status: INITIAL    ASSESSMENT:  CLINICAL IMPRESSION: 10/01/22: She is making slow but steady and initial progress since initial visit.  Continue plan of care  Patient is a 71 y.o. female who was seen today for occupational therapy evaluation for painful and stiff right hand triggering third and fourth fingers.  Also decreased functional ability.  She will benefit from occupational therapy treatment to increase functionality.     PLAN:  OT FREQUENCY: 1-2x/week  OT DURATION: 4 weeks (through 10/25/22 as needed)   PLANNED INTERVENTIONS: self care/ADL training, therapeutic exercise, therapeutic activity, manual therapy, passive range of motion, splinting, fluidotherapy, compression bandaging, moist heat, cryotherapy, contrast bath, patient/family education, and coping strategies training  CONSULTED AND AGREED WITH PLAN OF CARE: Patient  PLAN FOR NEXT SESSION:  Continue to encourage motion and stretches that do not cause trigger, continue functional activities.   Benito Mccreedy, OTR/L, CHT 10/01/2022, 3:05 PM

## 2022-10-01 ENCOUNTER — Encounter: Payer: Self-pay | Admitting: Rehabilitative and Restorative Service Providers"

## 2022-10-01 ENCOUNTER — Ambulatory Visit (INDEPENDENT_AMBULATORY_CARE_PROVIDER_SITE_OTHER): Payer: Medicare Other | Admitting: Rehabilitative and Restorative Service Providers"

## 2022-10-01 DIAGNOSIS — M25641 Stiffness of right hand, not elsewhere classified: Secondary | ICD-10-CM

## 2022-10-01 DIAGNOSIS — R600 Localized edema: Secondary | ICD-10-CM | POA: Diagnosis not present

## 2022-10-01 DIAGNOSIS — R278 Other lack of coordination: Secondary | ICD-10-CM

## 2022-10-01 DIAGNOSIS — M6281 Muscle weakness (generalized): Secondary | ICD-10-CM

## 2022-10-01 DIAGNOSIS — M25541 Pain in joints of right hand: Secondary | ICD-10-CM

## 2022-10-07 NOTE — Therapy (Signed)
OUTPATIENT OCCUPATIONAL THERAPY TREATMENT NOTE  Patient Name: Holly Russell MRN: [id number] DOB:06/25/1951, 71 y.o., female Today's Date: 10/01/2022  PCP: Tawnya Crook, MD REFERRING PROVIDER:  Gregor Hams, MD    END OF SESSION:  OT End of Session - 10/01/22 1109     Visit Number 2    Number of Visits 8    Date for OT Re-Evaluation 10/25/22    Authorization Type Medicare and BCBS suppliment    Progress Note Due on Visit 10    OT Start Time 1109    OT Stop Time 1150    OT Time Calculation (min) 41 min    Equipment Utilized During Treatment n/a    Activity Tolerance Patient tolerated treatment well;No increased pain;Patient limited by fatigue    Behavior During Therapy [facility] for tasks assessed/performed;Anxious              Past Medical History:  Diagnosis Date   Acid reflux    Agatston CAC score 100-199    Basal cell carcinoma    Cataract    Coronary atherosclerosis    Deviated nasal septum    Eczema    Gastroesophageal reflux disease    H/O bone density study 2019-2015   H/O echocardiogram 2015   H/O exercise stress test 3/4-5 2015   Hearing loss    High blood pressure    High cholesterol    History of chest pain    History of prediabetes    Hx of mammogram 2018-2017-2016-2015-2014   Hyperlipidemia    IBS (irritable bowel syndrome)    Intervertebral disc disease    Iron deficiency anemia    On statin therapy    Osteoarthritis    Osteopenia    unspecified  Location   SNHL (sensorineural hearing loss)    Spinal stenosis    Tinnitus of left ear    Vitamin D deficiency    Past Surgical History:  Procedure Laterality Date   ENDOMETRIAL BIOPSY     EYE SURGERY     lazy eye when 71yo   MOHS SURGERY     for basal cell to the right of right eye   TONSILLECTOMY     Patient Active Problem List   Diagnosis Date Noted   Age-related osteoporosis without current pathological fracture 09/04/2022   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 02/19/2021   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 02/19/2021   Urethral caruncle 02/19/2021   Microscopic hematuria 02/07/2021   Essential (primary) hypertension 09/15/2020   Pure hypercholesterolemia 09/15/2020   Spinal stenosis 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 09/15/2020   Gastroesophageal reflux disease with esophagitis without hemorrhage 09/15/2020   Presbycusis of both ears 09/15/2020   Tinnitus of both ears 09/15/2020   Estrogen deficiency 09/15/2020   Age-related cataract of both eyes 09/15/2020   Macular pucker, bilateral 09/15/2020    ONSET DATE: chronic, ~1-2 years, worse in past 6 months   REFERRING DIAG:  M79.641 (ICD-10-CM) - Right hand pain  M65.30 (ICD-10-CM) - Trigger finger, acquired  M72.0 (ICD-10-CM) - Dupuytren's contracture of right hand    THERAPY DIAG:  Localized edema  Other lack of coordination  Muscle weakness (generalized)  Stiffness of right hand, not elsewhere classified  Pain in joint of right hand  Rationale for Evaluation and Treatment: Rehabilitation  PERTINENT HISTORY: Per MD notes: "right hand pain and triggering primarily due to trigger finger third and fourth digits.  She does have evidence of mild Dupuytren's contracture which I do not think is very  symptomatic at this time"  She underwent steroid injection on 09/13/22 which was very successful at the time.  Per eval: "She arrives holding her right hand outstretched obviously nervously.  She states her hand started having pain and problems 2 years ago and she thought it was just tightness.  Only in the past 6 months did it start really hurting and triggering in her ring finger and middle finger.  She states this runs in her family.  She also states not really knowing what to do now after her injections, but she has not had significant pain or any triggering since injections."  PRECAUTIONS: None  WEIGHT BEARING RESTRICTIONS: No   SUBJECTIVE:   SUBJECTIVE STATEMENT: She states ***  ding more  with her right hand now, RF did trigger once, but otherwise MF feels looser now.   PAIN:  Are you having pain? Yes, generally aching through hand, wrist, and forearm  Rating: 2/10 at rest now, up to 5/10 at worst in past week when ring finger locked  PATIENT GOALS: To get back full use of right nondominant hand    OBJECTIVE: (All objective assessments below are from initial evaluation on: 09/24/22 unless otherwise specified.)   HAND DOMINANCE: Left  ADLs: Overall ADLs: States decreased ability to grab, hold household objects, pain and inability to open containers, perform FMS tasks (manipulate fasteners on clothing)   FUNCTIONAL OUTCOME MEASURES: Eval: Quck DASH 16% impairment today  (Higher % Score  =  More Impairment)     UPPER EXTREMITY ROM     Shoulder to Wrist AROM Right eval Left eval  Wrist flexion 72 71  Wrist extension 60 68  (Blank rows = not tested)   Hand AROM Right eval Rt 10/01/22  Full Fist Ability (or Gap to Distal Palmar Crease) No, 8cm gap today No ~7cm gap today (from MF tip)   Thumb Opposition to Small Finger (or Gap) Yes   Thumb Opposition to Base of Small Finger (or Gap)  Yes   Long MCP (0-90) 0-74  0-67  Long PIP (0-100) 0-47  0-33  Long DIP (0-70) 0-24  0-5  Ring MCP (0-90) 0-58  0-61  Ring PIP (0-100) 0-40  0-40  Ring DIP (0-70)  0-17 0-15  (Blank rows = not tested)   UPPER EXTREMITY MMT:    Eval:  NT at eval due to tenderness and apprehension at eval. Will be tested when appropriate.   MMT Right TBD  Elbow flexion   Elbow extension   Forearm supination   Forearm pronation   Wrist flexion   Wrist extension   Wrist ulnar deviation   Wrist radial deviation   (Blank rows = not tested)  HAND FUNCTION: Eval: Observed weakness in affected Rt hand. NT due to only ~10 days since injections Grip strength Right: TBD lbs, Left: 38.1 lbs   COORDINATION: Eval: Observed coordination impairments with affected hand. Box and Blocks Test: Rt  36 Blocks today (51 is WFL);   OBSERVATIONS:   Eval: Tips of RF and MF of Rt hand purple and cool to touch when she arrived.  2 Band-Aids each arm wrapped circumferentially around ring finger and middle finger of right hand, too tightly.  She is holding fingers of Rt hand extremely guarded and straight.  She is nervous at the idea of trying to move her right hand.   TODAY'S TREATMENT:  10/08/22: ***Continue to encourage motion and stretches that do not cause trigger, continue functional activities.   10/01/22: She does  new AROM showing some progress since last week.  States some confusion with "hook" stretch, so OT does slowly with manual therapy and has her perform back. OT also reviews other exercises with her and she also has apparent proprioception issues with tendon glides.  She then does fnl activities for FMS with pegs and forced Rt hand use, as well as FA strength and wrist strength with green flexbar x10 in supination, x10 in wrist flexion.    Exercises - Reach arms upward   - 4 x daily - 10 reps - Bend and Pull Back Wrist SLOWLY  - 4 x daily - 10-15 reps - Wrist Prayer Stretch  - 4 x daily - 3-5 reps - 15 sec hold - Thumb Opposition  - 4-6 x daily - 10 reps - Tendon Glides  - 4-6 x daily - 3-5 reps - 2-3 seconds hold - HOOK Stretch  - 4 x daily - 3-5 reps - 15-20 sec hold   PATIENT EDUCATION: Education details: See tx section above for details  Person educated: Patient Education method: Veterinary surgeon, Teach back, Handouts  Education comprehension: States and demonstrates understanding, Additional Education required    HOME EXERCISE PROGRAM: Access Code: [email or web address] URL: [email or web address] Date: 09/24/2022 Prepared by: Benito Mccreedy   GOALS: Goals reviewed with patient? Yes   SHORT TERM GOALS: (STG required if POC>30 days) Target Date: 10/11/22  Pt will obtain protective, custom orthotic. Goal status: TBD/PRN  2.  Pt will demo/state  understanding of initial HEP to improve pain levels and prerequisite motion. Goal status: INITIAL   LONG TERM GOALS: Target Date: 10/25/22  Pt will improve functional ability by decreased impairment per Quick DASH assessment from 16% to 10% or better, for better quality of life. Goal status: INITIAL  2.  Pt will improve grip strength in Rt hand to at least 30#lbs for functional use at home and in IADLs. Goal status: INITIAL  3.  Pt will improve A/ROM in Rt MF and RF TAM from 145*/115* respectively to at least 200* each, to have functional motion for tasks like reach and grasp.  Goal status: INITIAL  4.  Pt will improve strength in Rt hand/wrist to at least 4/5 MMT to have increased functional ability to carry out selfcare and higher-level homecare tasks with no difficulty. Goal status: INITIAL  5.  Pt will improve coordination skills in Rt, as seen by better score on Box & Blocks testing to [facility] to have increased functional ability to carry out fine motor tasks (fasteners, etc.) and more complex, coordinated IADLs (meal prep, sports, etc.).  Goal status: INITIAL    ASSESSMENT:  CLINICAL IMPRESSION: 10/08/22: ***  10/01/22: She is making slow but steady and initial progress since initial visit.  Continue plan of care  Patient is a 71 y.o. female who was seen today for occupational therapy evaluation for painful and stiff right hand triggering third and fourth fingers.  Also decreased functional ability.  She will benefit from occupational therapy treatment to increase functionality.     PLAN:  OT FREQUENCY: 1-2x/week  OT DURATION: 4 weeks (through 10/25/22 as needed)   PLANNED INTERVENTIONS: self care/ADL training, therapeutic exercise, therapeutic activity, manual therapy, passive range of motion, splinting, fluidotherapy, compression bandaging, moist heat, cryotherapy, contrast bath, patient/family education, and coping strategies training  CONSULTED AND AGREED WITH PLAN OF  CARE: Patient  PLAN FOR NEXT SESSION:  ***   Benito Mccreedy, OTR/L, CHT 10/01/2022, 3:05 PM

## 2022-10-08 ENCOUNTER — Ambulatory Visit (INDEPENDENT_AMBULATORY_CARE_PROVIDER_SITE_OTHER): Payer: Medicare Other | Admitting: Rehabilitative and Restorative Service Providers"

## 2022-10-08 ENCOUNTER — Encounter: Payer: Self-pay | Admitting: Rehabilitative and Restorative Service Providers"

## 2022-10-08 DIAGNOSIS — M25641 Stiffness of right hand, not elsewhere classified: Secondary | ICD-10-CM

## 2022-10-08 DIAGNOSIS — R600 Localized edema: Secondary | ICD-10-CM | POA: Diagnosis not present

## 2022-10-08 DIAGNOSIS — M25541 Pain in joints of right hand: Secondary | ICD-10-CM

## 2022-10-08 DIAGNOSIS — M6281 Muscle weakness (generalized): Secondary | ICD-10-CM

## 2022-10-08 DIAGNOSIS — R278 Other lack of coordination: Secondary | ICD-10-CM

## 2022-10-11 ENCOUNTER — Ambulatory Visit (INDEPENDENT_AMBULATORY_CARE_PROVIDER_SITE_OTHER): Payer: Medicare Other | Admitting: Podiatry

## 2022-10-11 DIAGNOSIS — M76821 Posterior tibial tendinitis, right leg: Secondary | ICD-10-CM | POA: Diagnosis not present

## 2022-10-11 NOTE — Progress Notes (Unsigned)
Subjective: Still gets some "twinges" when walks more than 2 miles, no constatnt.   Denies any systemic complaints such as fevers, chills, nausea, vomiting. No acute changes since last appointment, and no other complaints at this time.   Objective: AAO x3, NAD DP/PT pulses palpable bilaterally, CRT less than 3 seconds Protective sensation intact with Simms Weinstein monofilament, vibratory sensation intact, Achilles tendon reflex intact No areas of pinpoint bony tenderness or pain with vibratory sensation. MMT 5/5, ROM WNL. No edema, erythema, increase in warmth to bilateral lower extremities.  No open lesions or pre-ulcerative lesions.  No pain with calf compression, swelling, warmth, erythema  Assessment:  Plan: -All treatment options discussed with the patient including all alternatives, risks, complications.  Stretching/icing reg -Patient encouraged to call the office with any questions, concerns, change in symptoms.

## 2022-10-11 NOTE — Patient Instructions (Signed)

## 2022-10-15 ENCOUNTER — Encounter: Payer: Medicare Other | Admitting: Rehabilitative and Restorative Service Providers"

## 2022-10-22 ENCOUNTER — Encounter: Payer: Medicare Other | Admitting: Rehabilitative and Restorative Service Providers"

## 2022-10-31 ENCOUNTER — Encounter: Payer: Medicare Other | Admitting: Rehabilitative and Restorative Service Providers"

## 2022-11-05 NOTE — Therapy (Signed)
OUTPATIENT OCCUPATIONAL THERAPY TREATMENT NOTE  Patient Name: Holly Russell MRN: [id number] DOB:1951/06/08, 72 y.o., female Today's Date: 11/05/2022  PCP: Tawnya Crook, MD REFERRING PROVIDER:  Gregor Hams, MD    END OF SESSION:    Past Medical History:  Diagnosis Date   Acid reflux    Agatston CAC score 100-199    Basal cell carcinoma    Cataract    Coronary atherosclerosis    Deviated nasal septum    Eczema    Gastroesophageal reflux disease    H/O bone density study 2019-2015   H/O echocardiogram 2015   H/O exercise stress test 3/4-5 2015   Hearing loss    High blood pressure    High cholesterol    History of chest pain    History of prediabetes    Hx of mammogram 2018-2017-2016-2015-2014   Hyperlipidemia    IBS (irritable bowel syndrome)    Intervertebral disc disease    Iron deficiency anemia    On statin therapy    Osteoarthritis    Osteopenia    unspecified  Location   SNHL (sensorineural hearing loss)    Spinal stenosis    Tinnitus of left ear    Vitamin D deficiency    Past Surgical History:  Procedure Laterality Date   ENDOMETRIAL BIOPSY     EYE SURGERY     lazy eye when 72yo   MOHS SURGERY     for basal cell to the right of right eye   TONSILLECTOMY     Patient Active Problem List   Diagnosis Date Noted   Age-related osteoporosis without current pathological fracture 09/04/2022   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 02/19/2021   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 02/19/2021   Urethral caruncle 02/19/2021   Microscopic hematuria 02/07/2021   Essential (primary) hypertension 09/15/2020   Pure hypercholesterolemia 09/15/2020   Spinal stenosis 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 09/15/2020   Gastroesophageal reflux disease with esophagitis without hemorrhage 09/15/2020   Presbycusis of both ears 09/15/2020   Tinnitus of both ears 09/15/2020   Estrogen deficiency 09/15/2020   Age-related cataract of both eyes 09/15/2020   Macular  pucker, bilateral 09/15/2020    ONSET DATE: chronic, ~1-2 years, worse in past 6 months   REFERRING DIAG:  M79.641 (ICD-10-CM) - Right hand pain  M65.30 (ICD-10-CM) - Trigger finger, acquired  M72.0 (ICD-10-CM) - Dupuytren's contracture of right hand    THERAPY DIAG:  No diagnosis found.  Rationale for Evaluation and Treatment: Rehabilitation  PERTINENT HISTORY: Per MD notes: "right hand pain and triggering primarily due to trigger finger third and fourth digits.  She does have evidence of mild Dupuytren's contracture which I do not think is very symptomatic at this time"  She underwent steroid injection on 09/13/22 which was very successful at the time.  Per eval: "She arrives holding her right hand outstretched obviously nervously.  She states her hand started having pain and problems 2 years ago and she thought it was just tightness.  Only in the past 6 months did it start really hurting and triggering in her ring finger and middle finger.  She states this runs in her family.  She also states not really knowing what to do now after her injections, but she has not had significant pain or any triggering since injections."  PRECAUTIONS: None  WEIGHT BEARING RESTRICTIONS: No   SUBJECTIVE:   SUBJECTIVE STATEMENT: She states ***   that she has been using her hand, not triggering, feeling much  looser and better now in her right hand and less fearful.   PAIN:  Are you having pain? No *** Rating: 0/10 at rest now, up to 1/10 at worst in past week   PATIENT GOALS: To get back full use of right nondominant hand    OBJECTIVE: (All objective assessments below are from initial evaluation on: 09/24/22 unless otherwise specified.)   HAND DOMINANCE: Left  ADLs: Overall ADLs: States decreased ability to grab, hold household objects, pain and inability to open containers, perform FMS tasks (manipulate fasteners on clothing)   FUNCTIONAL OUTCOME MEASURES: Eval: Quck DASH 16%  impairment today  (Higher % Score  =  More Impairment)     UPPER EXTREMITY ROM     Shoulder to Wrist AROM Right eval Left eval Right 10/08/22  Wrist flexion 72 71 70  Wrist extension 60 68 78  (Blank rows = not tested)   Hand AROM Right eval Rt 10/01/22 Rt 10/08/22 Rt 11/07/22  Full Fist Ability (or Gap to Distal Palmar Crease) No, 8cm gap today No ~7cm gap today (from MF tip)     Thumb Opposition to Small Finger (or Gap) Yes     Thumb Opposition to Base of Small Finger (or Gap)  Yes     Long MCP (0-90) 0-74  0-67 0- 77 0-***  Long PIP (0-100) 0-47  0-33 0- 41 0-***  Long DIP (0-70) 0-24  0-5 0- 24   Ring MCP (0-90) 0-58  0-61 0- 47 0-***  Ring PIP (0-100) 0-40  0-40 0- 47 0-***  Ring DIP (0-70)  0-17 0-15 0- 20   (Blank rows = not tested)   UPPER EXTREMITY MMT:    Eval:  NT at eval due to tenderness and apprehension at eval. Will be tested when appropriate.   MMT Right TBD  Elbow flexion   Elbow extension   Forearm supination   Forearm pronation   Wrist flexion   Wrist extension   Wrist ulnar deviation   Wrist radial deviation   (Blank rows = not tested)  HAND FUNCTION: 11/07/22: Rt grip: ***#  Eval: Observed weakness in affected Rt hand. NT due to only ~10 days since injections Grip strength Right: TBD lbs, Left: 38.1 lbs   COORDINATION: 11/07/22: ***Blocks Rt hand (BBT)    Eval: Observed coordination impairments with affected hand. Box and Blocks Test: Rt 36 Blocks today (51 is WFL);   OBSERVATIONS:   Eval: Tips of RF and MF of Rt hand purple and cool to touch when she arrived.  2 Band-Aids each arm wrapped circumferentially around ring finger and middle finger of right hand, too tightly.  She is holding fingers of Rt hand extremely guarded and straight.  She is nervous at the idea of trying to move her right hand.   TODAY'S TREATMENT:  11/07/22: ***Check on her progress in the new year after about 5 weeks of rest and orthotics and prevention of triggering.   Begin stretching and strengthening phase of recovery as tolerated.   10/08/22: She performs active range of motion at the wrist and hand for new measures today, doing much better at the wrist and in the than hand when last checked.  She also appears less fearful of using her hand and less guarded.  OT reviews home exercises (gentle finger extension stretches DIP flexion stretches active tendon gliding without triggering, etc.)  And she demonstrates thumb without problems.  She does complain of slight irritation from adhesive of Band-Aid around finger for  so long, so OT decides to fabricate custom orthotics that she can wear in the night or day to prevent triggering and let her finger be free from that adhesive.  OT fabricates two custom PIP flexion blocking orthotics for the right hand middle and ring fingers.  A fit well prevent flexion and prevent triggering and symptoms.  She demonstrates putting them on and off well.  OT also educates on doing full composite finger flexion stretches to thumb index finger and small finger of the right hand, which she does well with this well.  Finally OT makes recommendations to continue as many functional activities as she can at home to keep hand loose and mobile and more functional.  Functional activities were discussed for some time.  She states understanding these things.  OT explained to her that healing may take a few weeks and that she can manage herself and come back at about the 5 or 6-week mark or for status check and to upgrade to's full finger stretches and strengthening as tolerated.  She is in agreement with this, but also understands she should return sooner if symptoms increase for any reason.      PATIENT EDUCATION: Education details: See tx section above for details  Person educated: Patient Education method: Verbal Instruction, Teach back, Handouts  Education comprehension: States and demonstrates understanding, Additional Education required     HOME EXERCISE PROGRAM: Access Code: [email or web address] URL: [email or web address] Date: 09/24/2022 Prepared by: Benito Mccreedy   GOALS: Goals reviewed with patient? Yes   SHORT TERM GOALS: (STG required if POC>30 days) Target Date: 10/11/22  Pt will obtain protective, custom orthotic. Goal status: 10/08/22 MET  2.  Pt will demo/state understanding of initial HEP to improve pain levels and prerequisite motion. Goal status: 10/08/22 MET   LONG TERM GOALS: Target Date: 10/25/22  Pt will improve functional ability by decreased impairment per Quick DASH assessment from 16% to 10% or better, for better quality of life. Goal status: INITIAL  2.  Pt will improve grip strength in Rt hand to at least 30#lbs for functional use at home and in IADLs. Goal status: Progressing  3.  Pt will improve A/ROM in Rt MF and RF TAM from 145*/115* respectively to at least 200* each, to have functional motion for tasks like reach and grasp.  Goal status: 10/08/22: Progressing  4.  Pt will improve strength in Rt hand/wrist to at least 4/5 MMT to have increased functional ability to carry out selfcare and higher-level homecare tasks with no difficulty. Goal status: Progressing  5.  Pt will improve coordination skills in Rt, as seen by better score on Box & Blocks testing to [facility] to have increased functional ability to carry out fine motor tasks (fasteners, etc.) and more complex, coordinated IADLs (meal prep, sports, etc.).  Goal status: INITIAL    ASSESSMENT:  CLINICAL IMPRESSION: 11/07/22: ***  10/08/22: As she is doing much better, moving more freely and less guarded, we will withhold therapy as tolerated for the next 3 to 4 weeks to allow for healing time of triggering fingers and tendons.  If tolerated after that time, she will begin stretching and getting back to full mobility of her hand as required also strengthening of her hand.  She is in agreement with this plan, but was also advised  strongly to return sooner if any new problems arise or symptoms worsen.  She states understanding   PLAN:  OT FREQUENCY: 1-2x/week  OT DURATION:We need to extend POC timeframe  from 10/25/22 - 11/15/22 (4 additional weeks) to accommodate for healing time for triggering fingers. We should be fine with 8 total visits per original POC.   PLANNED INTERVENTIONS: self care/ADL training, therapeutic exercise, therapeutic activity, manual therapy, passive range of motion, splinting, fluidotherapy, compression bandaging, moist heat, cryotherapy, contrast bath, patient/family education, and coping strategies training  CONSULTED AND AGREED WITH PLAN OF CARE: Patient  PLAN FOR NEXT SESSION:  ***    Benito Mccreedy, OTR/L, CHT 11/05/2022, 9:22 AM

## 2022-11-07 ENCOUNTER — Encounter: Payer: Self-pay | Admitting: Rehabilitative and Restorative Service Providers"

## 2022-11-07 ENCOUNTER — Ambulatory Visit (INDEPENDENT_AMBULATORY_CARE_PROVIDER_SITE_OTHER): Payer: Medicare Other | Admitting: Rehabilitative and Restorative Service Providers"

## 2022-11-07 DIAGNOSIS — R278 Other lack of coordination: Secondary | ICD-10-CM

## 2022-11-07 DIAGNOSIS — R600 Localized edema: Secondary | ICD-10-CM

## 2022-11-07 DIAGNOSIS — M6281 Muscle weakness (generalized): Secondary | ICD-10-CM | POA: Diagnosis not present

## 2022-11-07 DIAGNOSIS — M25541 Pain in joints of right hand: Secondary | ICD-10-CM

## 2022-11-07 DIAGNOSIS — M25641 Stiffness of right hand, not elsewhere classified: Secondary | ICD-10-CM | POA: Diagnosis not present

## 2022-11-08 ENCOUNTER — Encounter: Payer: Self-pay | Admitting: Family Medicine

## 2022-11-08 ENCOUNTER — Ambulatory Visit (INDEPENDENT_AMBULATORY_CARE_PROVIDER_SITE_OTHER): Payer: Medicare Other | Admitting: Family Medicine

## 2022-11-08 VITALS — BP 132/68 | HR 61 | Temp 97.5°F | Ht 60.5 in | Wt 134.2 lb

## 2022-11-08 DIAGNOSIS — E538 Deficiency of other specified B group vitamins: Secondary | ICD-10-CM

## 2022-11-08 DIAGNOSIS — E7800 Pure hypercholesterolemia, unspecified: Secondary | ICD-10-CM | POA: Diagnosis not present

## 2022-11-08 DIAGNOSIS — R7303 Prediabetes: Secondary | ICD-10-CM

## 2022-11-08 DIAGNOSIS — M810 Age-related osteoporosis without current pathological fracture: Secondary | ICD-10-CM | POA: Diagnosis not present

## 2022-11-08 DIAGNOSIS — I10 Essential (primary) hypertension: Secondary | ICD-10-CM

## 2022-11-08 DIAGNOSIS — Z1159 Encounter for screening for other viral diseases: Secondary | ICD-10-CM

## 2022-11-08 DIAGNOSIS — K2100 Gastro-esophageal reflux disease with esophagitis, without bleeding: Secondary | ICD-10-CM | POA: Diagnosis not present

## 2022-11-08 LAB — COMPREHENSIVE METABOLIC PANEL
ALT: 30 U/L (ref 0–35)
AST: 20 U/L (ref 0–37)
Albumin: 4.4 g/dL (ref 3.5–5.2)
Alkaline Phosphatase: 64 U/L (ref 39–117)
BUN: 21 mg/dL (ref 6–23)
CO2: 29 mEq/L (ref 19–32)
Calcium: 9.5 mg/dL (ref 8.4–10.5)
Chloride: 103 mEq/L (ref 96–112)
Creatinine, Ser: 0.7 mg/dL (ref 0.40–1.20)
GFR: 86.13 mL/min (ref >60.00)
Glucose, Bld: 96 mg/dL (ref 70–99)
Potassium: 4.4 mEq/L (ref 3.5–5.1)
Sodium: 139 mEq/L (ref 135–145)
Total Bilirubin: 1.1 mg/dL (ref 0.2–1.2)
Total Protein: 6.6 g/dL (ref 6.0–8.3)

## 2022-11-08 LAB — CBC WITH DIFFERENTIAL/PLATELET
Basophils Absolute: 0 10*3/uL (ref 0.0–0.1)
Basophils Relative: 0.5 % (ref 0.0–3.0)
Eosinophils Absolute: 0 10*3/uL (ref 0.0–0.7)
Eosinophils Relative: 0.8 % (ref 0.0–5.0)
HCT: 40.3 % (ref 36.0–46.0)
Hemoglobin: 13.9 g/dL (ref 12.0–15.0)
Lymphocytes Relative: 24.2 % (ref 12.0–46.0)
Lymphs Abs: 1.1 10*3/uL (ref 0.7–4.0)
MCHC: 34.4 g/dL (ref 30.0–36.0)
MCV: 88 fl (ref 78.0–100.0)
Monocytes Absolute: 0.3 10*3/uL (ref 0.1–1.0)
Monocytes Relative: 6.4 % (ref 3.0–12.0)
Neutro Abs: 3.1 10*3/uL (ref 1.4–7.7)
Neutrophils Relative %: 68.1 % (ref 43.0–77.0)
Platelets: 272 10*3/uL (ref 150.0–400.0)
RBC: 4.58 Mil/uL (ref 3.87–5.11)
RDW: 14.7 % (ref 11.5–15.5)
WBC: 4.5 10*3/uL (ref 4.0–10.5)

## 2022-11-08 LAB — LIPID PANEL
Cholesterol: 267 mg/dL — ABNORMAL HIGH (ref 0–200)
HDL: 130.6 mg/dL (ref >39.00)
LDL Cholesterol: 125 mg/dL — ABNORMAL HIGH (ref 0–99)
NonHDL: 136.77
Total CHOL/HDL Ratio: 2
Triglycerides: 58 mg/dL (ref 0.0–149.0)
VLDL: 11.6 mg/dL (ref 0.0–40.0)

## 2022-11-08 LAB — HEMOGLOBIN A1C: Hgb A1c MFr Bld: 5.8 % (ref 4.6–6.5)

## 2022-11-08 LAB — VITAMIN D 25 HYDROXY (VIT D DEFICIENCY, FRACTURES): VITD: 30.67 ng/mL (ref 30.00–100.00)

## 2022-11-08 LAB — TSH: TSH: 1.65 u[IU]/mL (ref 0.35–5.50)

## 2022-11-08 LAB — VITAMIN B12: Vitamin B-12: 744 pg/mL (ref 211–911)

## 2022-11-08 NOTE — Patient Instructions (Signed)
It was very nice to see you today!  Get Tdap at pharmacy  PLEASE NOTE:  If you had any lab tests please let us know if you have not heard back within a few days. You may see your results on MyChart before we have a chance to review them but we will give you a call once they are reviewed by us. If we ordered any referrals today, please let us know if you have not heard from their office within the next week.   Please try these tips to maintain a healthy lifestyle:  Eat most of your calories during the day when you are active. Eliminate processed foods including packaged sweets (pies, cakes, cookies), reduce intake of potatoes, white bread, white pasta, and white rice. Look for whole grain options, oat flour or almond flour.  Each meal should contain half fruits/vegetables, one quarter protein, and one quarter carbs (no bigger than a computer mouse).  Cut down on sweet beverages. This includes juice, soda, and sweet tea. Also watch fruit intake, though this is a healthier sweet option, it still contains natural sugar! Limit to 3 servings daily.  Drink at least 1 glass of water with each meal and aim for at least 8 glasses per day  Exercise at least 150 minutes every week.   

## 2022-11-08 NOTE — Progress Notes (Signed)
Subjective:     Patient ID: Holly Russell, female    DOB: 1951/03/30, 72 y.o.   MRN: [id number]  Chief Complaint  Patient presents with   Annual Exam    Annual exam with fasting labs     HPI  GERD-doing ok if avoids tomato. Has decreased coffee to 1/2 caf.  Not getting reflux at night now and solved the nocturia issue.  Pepcid at hs.  Osteoporosis-on calcium/D HTN- see Nov.visit.  138/75. HLD-was 400, but on lipitor 40mg  Low B12 and pt pescitarian 6.  preDM-made a lot of changes to wt,diet/exercise Had flu/rsv/covid in Sept.  Will get shingrix and Tdap soon  Health Maintenance Due  Topic Date Due   Hepatitis C Screening  Never done   DTaP/Tdap/Td (2 - Td or Tdap) 06/01/2019    Past Medical History:  Diagnosis Date   Acid reflux    Agatston CAC score 100-199    Basal cell carcinoma    Cataract    Coronary atherosclerosis    Deviated nasal septum    Eczema    Gastroesophageal reflux disease    H/O bone density study 2019-2015   H/O echocardiogram 2015   H/O exercise stress test 3/4-5 2015   Hearing loss    High blood pressure    High cholesterol    History of chest pain    History of prediabetes    Hx of mammogram 2018-2017-2016-2015-2014   Hyperlipidemia    IBS (irritable bowel syndrome)    Intervertebral disc disease    Iron deficiency anemia    On statin therapy    Osteoarthritis    Osteopenia    unspecified  Location   SNHL (sensorineural hearing loss)    Spinal stenosis    Tinnitus of left ear    Vitamin D deficiency     Past Surgical History:  Procedure Laterality Date   ENDOMETRIAL BIOPSY     EYE SURGERY     lazy eye when 72yo   MOHS SURGERY     for basal cell to the right of right eye   TONSILLECTOMY      Outpatient Medications Prior to Visit  Medication Sig Dispense Refill   Acetaminophen (TYLENOL EXTRA STRENGTH PO) Take by mouth as needed.     aspirin EC 81 MG tablet Take 81 mg by mouth every Monday, Wednesday, and Friday. Swallow  whole.     atorvastatin (LIPITOR) 40 MG tablet TAKE 1 TABLET BY MOUTH EVERY DAY 90 tablet 3   Calcium Carb-Cholecalciferol (CALTRATE 600+D3 PO) Take 600 mLs by mouth daily. 1-2 tablets daily     clobetasol ointment (TEMOVATE) 0.05 % Apply topically as needed.     clotrimazole (LOTRIMIN) 1 % cream as needed.     famotidine (PEPCID) 20 MG tablet TAKE 1 TABLET BY MOUTH EVERY DAY 90 tablet 3   lisinopril (ZESTRIL) 10 MG tablet TAKE 1 TABLET BY MOUTH EVERY DAY 90 tablet 3   Polyethyl Glycol-Propyl Glycol (SYSTANE) 0.4-0.3 % SOLN as needed.     vitamin B-12 (CYANOCOBALAMIN) 1000 MCG tablet Take 1,000 mcg by mouth as needed.     No facility-administered medications prior to visit.    No Known Allergies ROS neg/noncontributory except as noted HPI/below  Dupytryns and trigger fingers-seeing Dr Denyse Amass.  Injections and PT. In [place]-gust of wind blew her over-some bruises to R knee. No injury. Walks 3 miles/day. Active.      Objective:     BP 132/68 (BP Location: Left Arm, Patient  Position: Sitting, Cuff Size: Normal)   Pulse 61   Temp (!) 97.5 F (36.4 C) (Temporal)   Ht 5' 0.5" (1.537 m)   Wt 134 lb 4 oz (60.9 kg)   SpO2 98%   BMI 25.79 kg/m  Wt Readings from Last 3 Encounters:  11/08/22 134 lb 4 oz (60.9 kg)  09/13/22 136 lb (61.7 kg)  09/04/22 133 lb 2 oz (60.4 kg)    Physical Exam   Gen: WDWN NAD HEENT: NCAT, conjunctiva not injected, sclera nonicteric TM WNL B, OP moist, no exudates  NECK:  supple, no thyromegaly, no nodes, no carotid bruits CARDIAC: RRR, S1S2+, no murmur. DP 2+B LUNGS: CTAB. No wheezes ABDOMEN:  BS+, soft, NTND, No HSM, no masses EXT:  no edema MSK: no gross abnormalities.  NEURO: A&O x3.  CN II-XII intact.  PSYCH: normal mood. Good eye contact     Assessment & Plan:   Problem List Items Addressed This Visit       Cardiovascular and Mediastinum   Essential (primary) hypertension - Primary   Relevant Orders   Comprehensive metabolic panel    Hemoglobin A1c   Lipid panel   TSH   CBC with Differential/Platelet     Digestive   Gastroesophageal reflux disease with esophagitis without hemorrhage   Relevant Orders   CBC with Differential/Platelet   Vitamin B12     Musculoskeletal and Integument   Age-related osteoporosis without current pathological fracture   Relevant Orders   VITAMIN D 25 Hydroxy (Vit-D Deficiency, Fractures)     Other   Pure hypercholesterolemia   Relevant Orders   Comprehensive metabolic panel   Hemoglobin A1c   Lipid panel   Other Visit Diagnoses     B12 deficiency       Relevant Orders   Vitamin B12   Prediabetes       Relevant Orders   Hemoglobin A1c   Encounter for hepatitis C screening test for low risk patient       Relevant Orders   Hepatitis C antibody     1.  Hypertension-chronic.  Well-controlled.  Continue lisinopril 10 mg.  Continue diet/exercise.  Check CBC, CMP, lipids, TSH, A1c.  Follow-up in 6 months 2.  Hyperlipidemia-chronic.  Controlled.  Continue Lipitor 40 mg daily.  Check CMP, lipids 3.  Prediabetes-chronic.  Well-controlled with diet/exercise.  Continue.  Check A1c 4.  GERD-chronic.  Controlled on Pepcid 20 mg daily.  Continue.  Check CBC, B12 5.  B12 deficiency-patient is pescatarian.  Not taking B12 supplements regularly.  Check B12 6.  Osteoporosis-chronic.  Stable.  Patient is taking calcium and vitamin D both orally and in her diet.  She is very active.  Continue.  Check TSH, CMP, vitamin D  No orders of the defined types were placed in this encounter.   Angelena Sole, MD

## 2022-11-12 ENCOUNTER — Other Ambulatory Visit: Payer: Medicare Other

## 2022-11-12 NOTE — Progress Notes (Signed)
Labs ok except A1C(3 month average of sugars) is elevated.  This is considered PreDiabetes.  Work on diet-decrease sugars and starches and aim for 30 minutes of exercise 5 days/week to prevent progression to diabetes

## 2022-11-14 ENCOUNTER — Ambulatory Visit: Payer: Medicare Other | Admitting: Orthopedic Surgery

## 2022-11-15 ENCOUNTER — Other Ambulatory Visit: Payer: Self-pay | Admitting: Family Medicine

## 2022-11-15 DIAGNOSIS — Z1231 Encounter for screening mammogram for malignant neoplasm of breast: Secondary | ICD-10-CM

## 2022-12-05 IMAGING — CT CT CHEST W/O CM
1 of 2 series · 15 of 32 positions shown, 19 images · non-contrast
Comparison: Correlation is made with the CT of the abdomen pelvis
dated February 07, 2021

CLINICAL DATA: Lung nodules, multiple < 6mm, stable on prior exam,
follow-up study



[Series 6: super d · axial · 0.70mm/px · z∈[+676,+937]mm · 15 of 366 slices shown, 19 images]
[im 20/366  mediastinal]
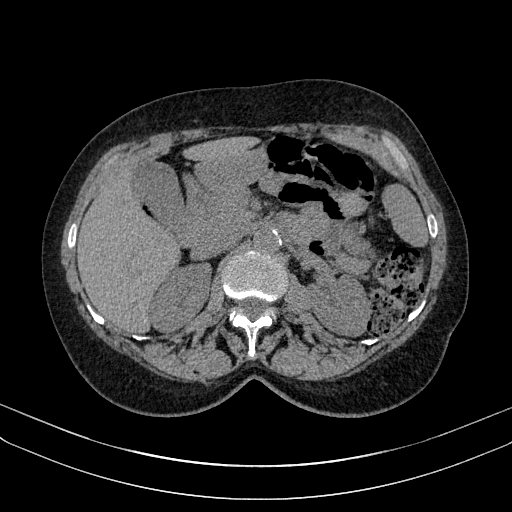
[im 20/366  lung]
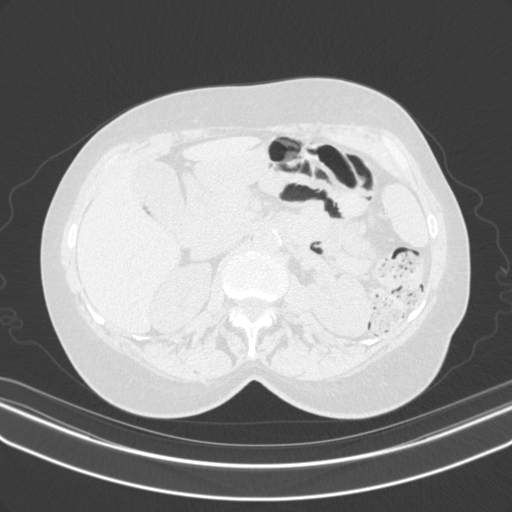
[im 58/366  lung]
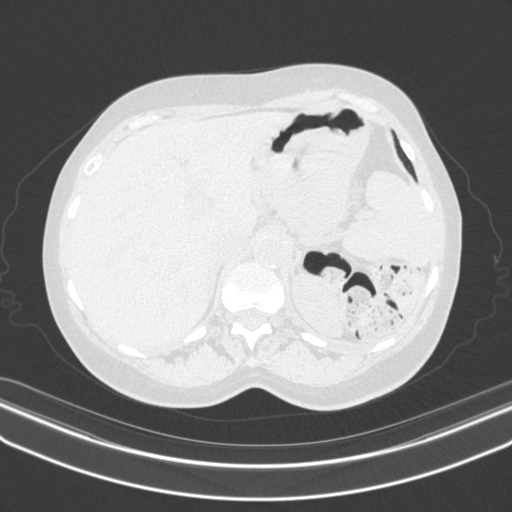
[im 77/366  lung]
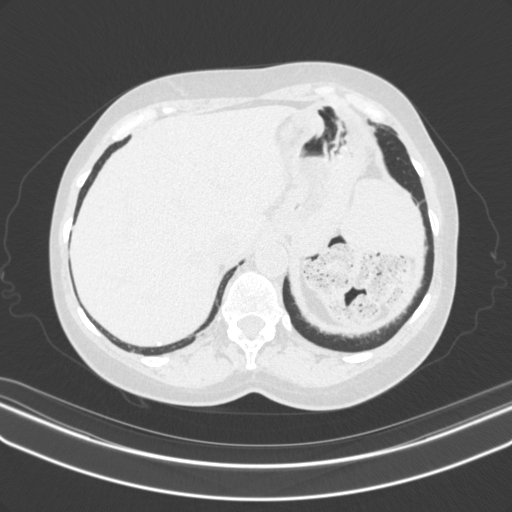
[im 97/366  lung]
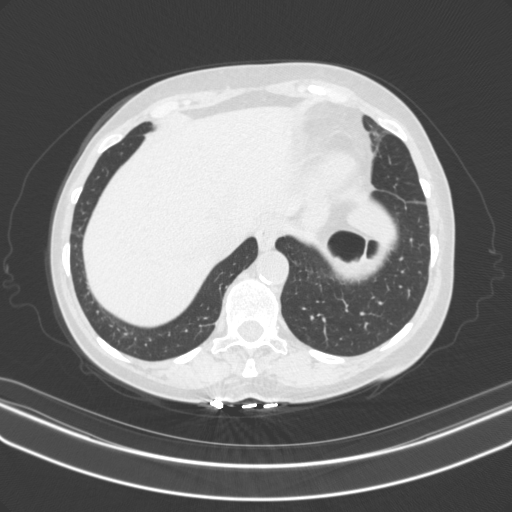
[im 122/366  mediastinal]
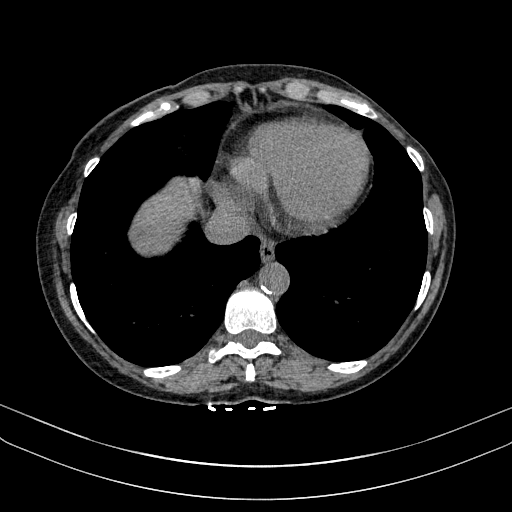
[im 122/366  lung]
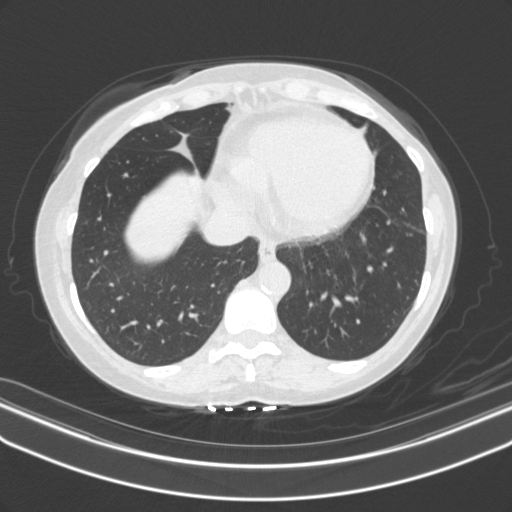
[im 135/366  lung]
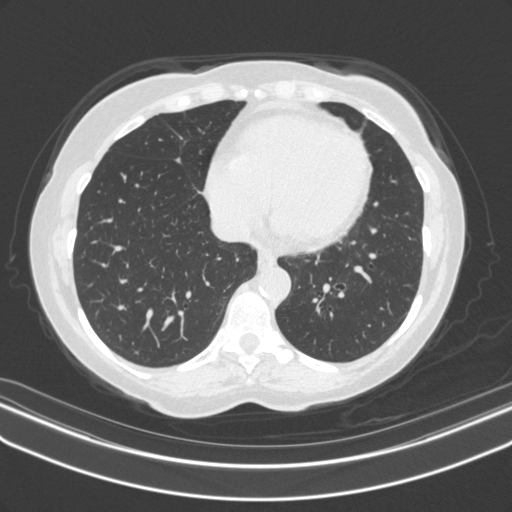
[im 171/366  lung]
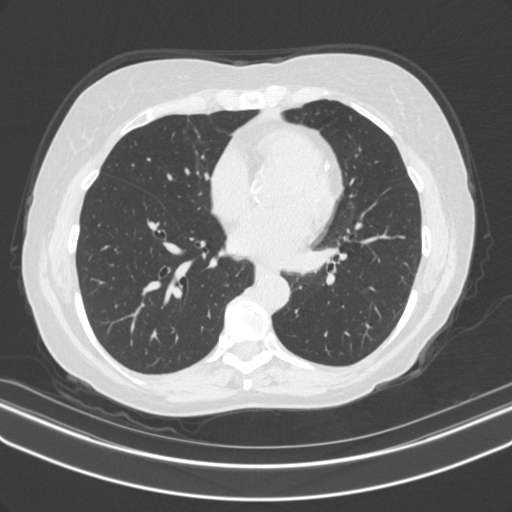
[im 173/366  lung]
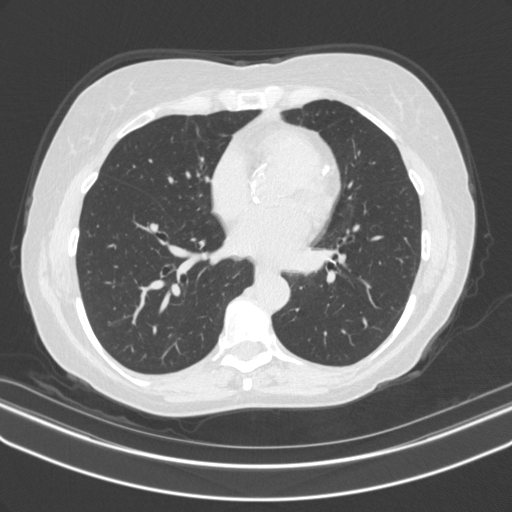
[im 193/366  mediastinal]
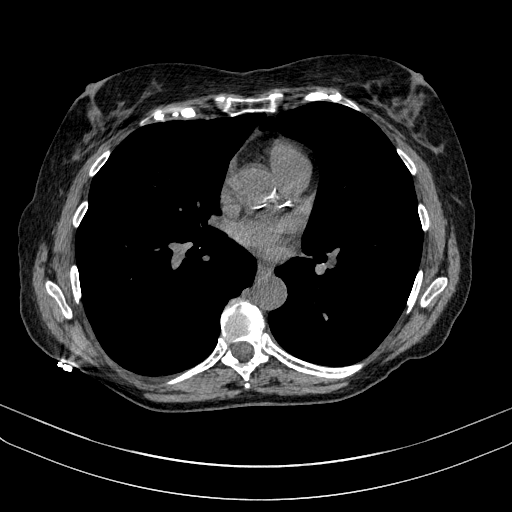
[im 193/366  lung]
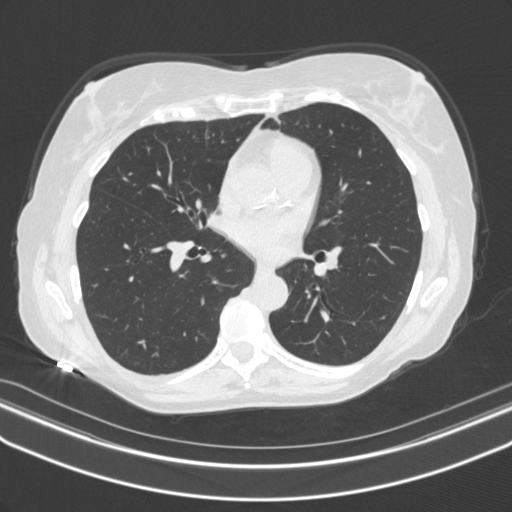
[im 231/366  lung]
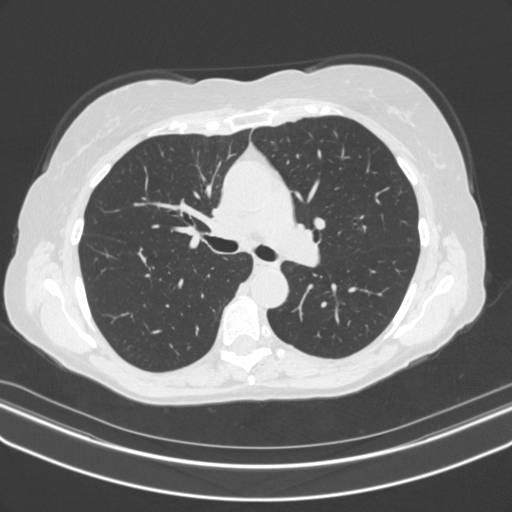
[im 244/366  lung]
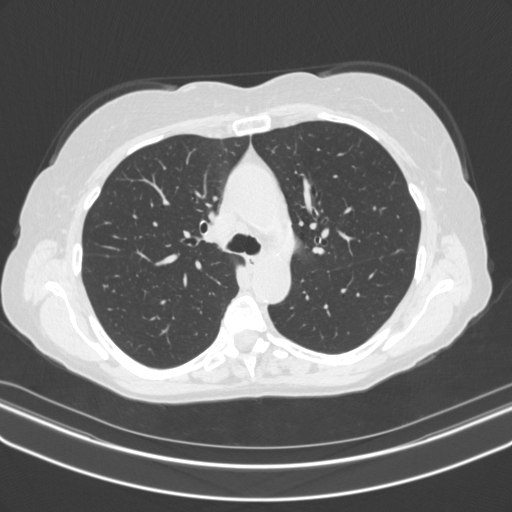
[im 269/366  lung]
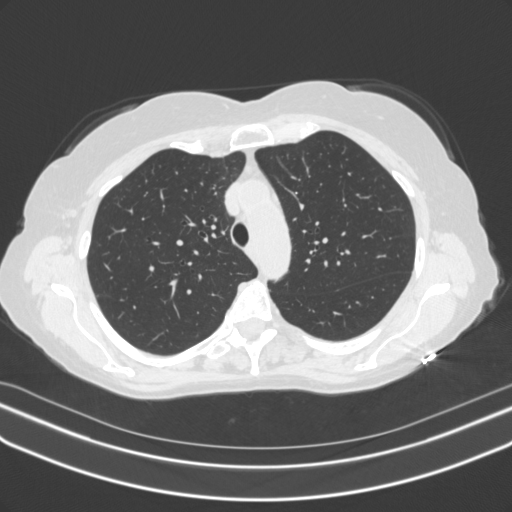
[im 289/366  mediastinal]
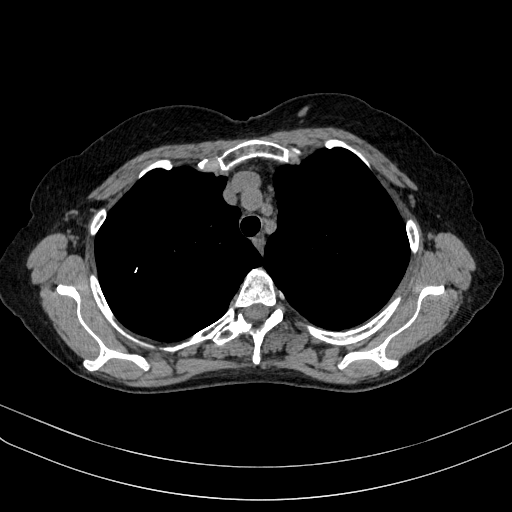
[im 289/366  lung]
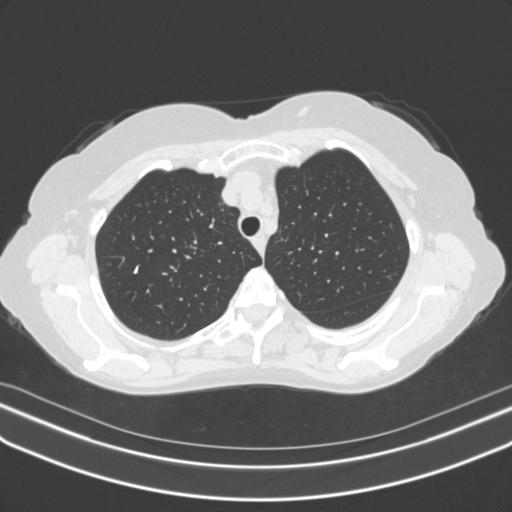
[im 308/366  lung]
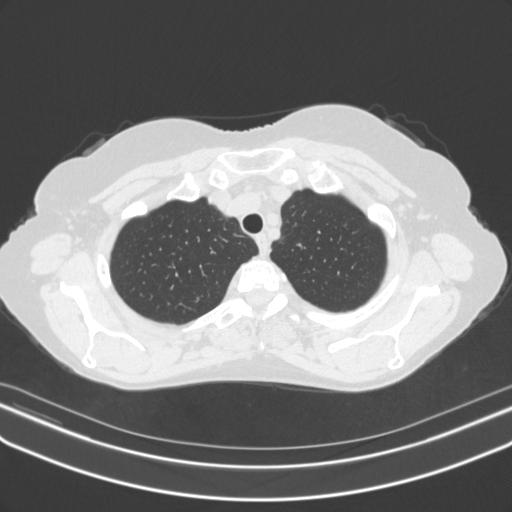
[im 346/366  lung]
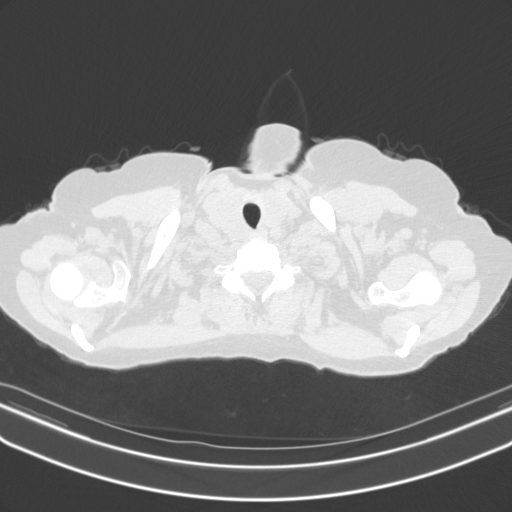

[15 of 32 positions shown; findings below may reference images not displayed]

FINDINGS: Cardiovascular: No cardiomegaly. Atherosclerotic coronary artery
calcifications. Thoracic aorta has a normal morphology without
dissection or aneurysm. Mild pericardial thickening.

Mediastinum/Nodes: No mass or significant lymphadenopathy.

Lungs/Pleura: Calcified granuloma in the right upper lobe

3 mm nodule in the right lower lobe (series 5, image 83), stable. 7
mm x 6 mm potential nodule along the posterior right pericardium in
the right lower lobe (series 5, image 84), stable. No new nodule
seen.

Upper Abdomen: Unremarkable

Musculoskeletal: Mild thoracic spondylosis.
IMPRESSION: There has been no significant interval change in the 3 mm nodule in
the right lower lobe and a 7 mm x 6 mm potential nodule along the
posterior right pericardium in the right lower lobe. No new nodules.
Consider additional non-contrast chest CT examinations at 2 and 4
years.

Non-contrast chest CT at 3-6 months is recommended. If nodules
persist and are stable at that time, consider additional
non-contrast chest CT examinations at 2 and 4 years. This
recommendation follows the consensus statement: Guidelines for
Management of Incidental Pulmonary Nodules Detected on CT Images:

## 2022-12-26 ENCOUNTER — Ambulatory Visit
Admission: RE | Admit: 2022-12-26 | Discharge: 2022-12-26 | Disposition: A | Discharge status: Home / Self Care | Payer: Medicare Other | Source: Ambulatory Visit | Attending: Family Medicine | Admitting: Family Medicine

## 2022-12-26 DIAGNOSIS — Z1231 Encounter for screening mammogram for malignant neoplasm of breast: Secondary | ICD-10-CM

## 2023-01-01 ENCOUNTER — Other Ambulatory Visit: Payer: Self-pay | Admitting: Family Medicine

## 2023-01-01 DIAGNOSIS — R928 Other abnormal and inconclusive findings on diagnostic imaging of breast: Secondary | ICD-10-CM

## 2023-01-11 ENCOUNTER — Other Ambulatory Visit: Payer: Self-pay | Admitting: Orthopedic Surgery

## 2023-01-11 DIAGNOSIS — K219 Gastro-esophageal reflux disease without esophagitis: Secondary | ICD-10-CM

## 2023-01-14 ENCOUNTER — Ambulatory Visit: Payer: Medicare Other

## 2023-01-14 ENCOUNTER — Other Ambulatory Visit: Payer: Self-pay | Admitting: *Deleted

## 2023-01-14 ENCOUNTER — Ambulatory Visit
Admission: RE | Admit: 2023-01-14 | Discharge: 2023-01-14 | Disposition: A | Discharge status: Home / Self Care | Payer: Medicare Other | Source: Ambulatory Visit | Attending: Family Medicine | Admitting: Family Medicine

## 2023-01-14 ENCOUNTER — Telehealth: Payer: Self-pay | Admitting: Family Medicine

## 2023-01-14 DIAGNOSIS — K219 Gastro-esophageal reflux disease without esophagitis: Secondary | ICD-10-CM

## 2023-01-14 DIAGNOSIS — R928 Other abnormal and inconclusive findings on diagnostic imaging of breast: Secondary | ICD-10-CM

## 2023-01-14 LAB — HM MAMMOGRAPHY

## 2023-01-14 MED ORDER — FAMOTIDINE 20 MG PO TABS: 20.0000 mg | ORAL_TABLET | Freq: Every day | ORAL | 3 refills | Status: DC

## 2023-01-14 NOTE — Telephone Encounter (Signed)
Last OV: 11/08/22 Next OV: 05/09/23 Medication: Famotidine 20 mg  Pharmacy: [facility] on [address] Has at least 2 weeks worth but will need rx called in by pcp since last prescribed by previous pcp.

## 2023-01-14 NOTE — Telephone Encounter (Signed)
Rx sent to the pharmacy.

## 2023-01-15 ENCOUNTER — Encounter: Payer: Self-pay | Admitting: Family Medicine

## 2023-03-16 ENCOUNTER — Ambulatory Visit (HOSPITAL_COMMUNITY)
Admission: EM | Admit: 2023-03-16 | Discharge: 2023-03-16 | Disposition: A | Discharge status: Home / Self Care | Payer: Medicare Other | Attending: Internal Medicine | Admitting: Internal Medicine

## 2023-03-16 ENCOUNTER — Encounter (HOSPITAL_COMMUNITY): Payer: Self-pay

## 2023-03-16 DIAGNOSIS — H18892 Other specified disorders of cornea, left eye: Secondary | ICD-10-CM | POA: Diagnosis not present

## 2023-03-16 MED ORDER — ERYTHROMYCIN 5 MG/GM OP OINT: TOPICAL_OINTMENT | OPHTHALMIC | 0 refills | Status: DC

## 2023-03-16 MED ORDER — FLUORESCEIN SODIUM 1 MG OP STRP
ORAL_STRIP | OPHTHALMIC | Status: AC
  Filled 2023-03-16: qty 1

## 2023-03-16 MED ORDER — TETRACAINE HCL 0.5 % OP SOLN
OPHTHALMIC | Status: AC
  Filled 2023-03-16: qty 4

## 2023-03-16 NOTE — ED Triage Notes (Signed)
Patient here today with c/o left eye irritation this afternoon after being outside and something blowing into her eye. She has been having pain and redness since.

## 2023-03-16 NOTE — Discharge Instructions (Signed)
Use erythromycin eye ointment to the left eye twice a day for 7 days.  Use warm compress to the left eye before using eye ointment.   Schedule an appointment with your eye doctor for follow-up as needed.  If you develop any new or worsening symptoms or do not improve in the next 2 to 3 days, please return.  If your symptoms are severe, please go to the emergency room.  Follow-up with your primary care provider for further evaluation and management of your symptoms as well as ongoing wellness visits.  I hope you feel better! =

## 2023-03-18 NOTE — ED Provider Notes (Signed)
[facility]    CSN: [id number] Arrival date & time: 03/16/23  1611      History   Chief Complaint Chief Complaint  Patient presents with   Eye Pain    HPI Holly Russell is a 72 y.o. female.   Patient presents to urgent care for evaluation of possible foreign body to the left eye. She states she was wearing her glasses while outside when dust or debris may have flew into her left eye due to the wind. Ever since, she reports left eye irritation towards the lateral canthus of the left eye as well as slight itching/pain to the left eye. Denies use of contact lenses, uses glasses as mentioned previously for vision correction. Denies vision changes, spots in vision, and recent blunt trauma/injury to the eyes. No recent antibiotic/steroid use or allergies to antibiotics. No headaches, dizziness, N/V/D, rashes, sore throat, cough, or fever/chills.    Eye Pain    Past Medical History:  Diagnosis Date   Acid reflux    Agatston CAC score 100-199    Basal cell carcinoma    Cataract    Coronary atherosclerosis    Deviated nasal septum    Eczema    Gastroesophageal reflux disease    H/O bone density study 2019-2015   H/O echocardiogram 2015   H/O exercise stress test 3/4-5 2015   Hearing loss    High blood pressure    High cholesterol    History of chest pain    History of prediabetes    Hx of mammogram 2018-2017-2016-2015-2014   Hyperlipidemia    IBS (irritable bowel syndrome)    Intervertebral disc disease    Iron deficiency anemia    On statin therapy    Osteoarthritis    Osteopenia    unspecified  Location   SNHL (sensorineural hearing loss)    Spinal stenosis    Tinnitus of left ear    Vitamin D deficiency     Patient Active Problem List   Diagnosis Date Noted   Age-related osteoporosis without current pathological fracture 09/04/2022   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 02/19/2021   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 02/19/2021   Urethral caruncle 02/19/2021    Microscopic hematuria 02/07/2021   Essential (primary) hypertension 09/15/2020   Pure hypercholesterolemia 09/15/2020   Spinal stenosis 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 09/15/2020   Gastroesophageal reflux disease with esophagitis without hemorrhage 09/15/2020   Presbycusis of both ears 09/15/2020   Tinnitus of both ears 09/15/2020   Estrogen deficiency 09/15/2020   Age-related cataract of both eyes 09/15/2020   Macular pucker, bilateral 09/15/2020    Past Surgical History:  Procedure Laterality Date   ENDOMETRIAL BIOPSY     EYE SURGERY     lazy eye when 72yo   MOHS SURGERY     for basal cell to the right of right eye   TONSILLECTOMY      OB History     Gravida  2   Para  2   Term  2   Preterm      AB      Living  2      SAB      IAB      Ectopic      Multiple      Live Births  2            Home Medications    Prior to Admission medications   Medication Sig Start Date End Date Taking? Authorizing Provider  aspirin EC 81 MG tablet Take 81 mg by mouth every Monday, Wednesday, and Friday. Swallow whole.   Yes [provider]  atorvastatin (LIPITOR) 40 MG tablet TAKE 1 TABLET BY MOUTH EVERY DAY 05/29/22  Yes Fargo, Amy E, NP  erythromycin ophthalmic ointment Place a 1/2 inch ribbon of ointment into the lower eyelid. 03/16/23  Yes Carlisle Beers, FNP  famotidine (PEPCID) 20 MG tablet Take 1 tablet (20 mg total) by mouth daily. 01/14/23  Yes Jeani Sow, MD  lisinopril (ZESTRIL) 10 MG tablet TAKE 1 TABLET BY MOUTH EVERY DAY 06/26/22  Yes Fargo, Amy E, NP  Acetaminophen (TYLENOL EXTRA STRENGTH PO) Take by mouth as needed.    [provider]  Calcium Carb-Cholecalciferol (CALTRATE 600+D3 PO) Take 600 mLs by mouth daily. 1-2 tablets daily    [provider]  clobetasol ointment (TEMOVATE) 0.05 % Apply topically as needed. 12/04/21   [provider]  clotrimazole (LOTRIMIN) 1 % cream as needed.     [provider]  Polyethyl Glycol-Propyl Glycol (SYSTANE) 0.4-0.3 % SOLN as needed.    [provider]  vitamin B-12 (CYANOCOBALAMIN) 1000 MCG tablet Take 1,000 mcg by mouth as needed.    [provider]    Family History Family History  Problem Relation Age of Onset   Stroke Mother 75   Basal cell carcinoma Mother    Hypertension Mother    Basal cell carcinoma Father    Hypertension Father    Varicose Veins Father    Breast cancer Paternal Aunt        57s   Heart attack Paternal Grandfather    Heart disease Paternal Grandfather    Breast cancer Cousin        30s-40s   Cancer Maternal Grandfather    Depression Paternal Grandmother    Cancer Paternal Aunt     Social History Social History   Tobacco Use   Smoking status: Never   Smokeless tobacco: Never  Vaping Use   Vaping Use: Never used  Substance Use Topics   Alcohol use: Yes    Alcohol/week: 3.0 standard drinks of alcohol    Types: 3 Glasses of wine per week    Comment: 3-4   Drug use: Never     Allergies   Patient has no known allergies.   Review of Systems Review of Systems  Eyes:  Positive for pain.  Per HPI   Physical Exam Triage Vital Signs ED Triage Vitals  Enc Vitals Group     BP 03/16/23 1634 136/69     Pulse Rate 03/16/23 1634 64     Resp 03/16/23 1634 16     Temp 03/16/23 1634 98.2 F (36.8 C)     Temp Source 03/16/23 1634 Oral     SpO2 03/16/23 1634 98 %     Weight 03/16/23 1634 131 lb (59.4 kg)     Height 03/16/23 1634 5' (1.524 m)     Head Circumference --      Peak Flow --      Pain Score 03/16/23 1633 3     Pain Loc --      Pain Edu? --      Excl. in GC? --    No data found.  Updated Vital Signs BP 136/69 (BP Location: Right Arm)   Pulse 64   Temp 98.2 F (36.8 C) (Oral)   Resp 16   Ht 5' (1.524 m)   Wt 131 lb (59.4 kg)  SpO2 98%   BMI 25.58 kg/m   Visual Acuity Right Eye Distance:   Left Eye Distance:   Bilateral Distance:     Right Eye Near:   Left Eye Near:    Bilateral Near:     Physical Exam Vitals and nursing note reviewed.  Constitutional:      Appearance: She is not ill-appearing or toxic-appearing.  HENT:     Head: Normocephalic and atraumatic.     Right Ear: Hearing, tympanic membrane, ear canal and external ear normal.     Left Ear: Hearing, tympanic membrane, ear canal and external ear normal.     Nose: Nose normal.     Mouth/Throat:     Lips: Pink.  Eyes:     General: Lids are normal. Vision grossly intact. Gaze aligned appropriately.        Right eye: No discharge.        Left eye: No discharge.     Extraocular Movements: Extraocular movements intact.     Conjunctiva/sclera:     Right eye: Right conjunctiva is not injected. No chemosis, exudate or hemorrhage.    Left eye: Left conjunctiva is injected. No chemosis, exudate or hemorrhage.    Pupils: Pupils are equal, round, and reactive to light.     Left eye: Fluorescein uptake present. No corneal abrasion.     Comments: No preseptal erythema or swelling. No foreign bodies noted to the left eye. Fluoresciene stain negative for corneal abrasion. Patient tolerated procedure well. Left eye flushed with normal saline after procedure.   Cardiovascular:     Rate and Rhythm: Normal rate and regular rhythm.     Heart sounds: Normal heart sounds, S1 normal and S2 normal.  Pulmonary:     Effort: Pulmonary effort is normal. No respiratory distress.     Breath sounds: Normal breath sounds and air entry.  Musculoskeletal:     Cervical back: Neck supple.  Skin:    General: Skin is warm and dry.     Capillary Refill: Capillary refill takes less than 2 seconds.     Findings: No rash.  Neurological:     General: No focal deficit present.     Mental Status: She is alert and oriented to person, place, and time. Mental status is at baseline.     Cranial Nerves: No dysarthria or facial asymmetry.  Psychiatric:        Mood and Affect: Mood normal.         Speech: Speech normal.        Behavior: Behavior normal.        Thought Content: Thought content normal.        Judgment: Judgment normal.      UC Treatments / Results  Labs (all labs ordered are listed, but only abnormal results are displayed) Labs Reviewed - No data to display  EKG   Radiology No results found.  Procedures Procedures (including critical care time)  Medications Ordered in UC Medications - No data to display  Initial Impression / Assessment and Plan / UC Course  I have reviewed the triage vital signs and the nursing notes.  Pertinent labs & imaging results that were available during my care of the patient were reviewed by me and considered in my medical decision making (see chart for details).   1. Corneal irritation of left eye Erythromycin BID for 7 days to treat corneal irritation and soothe the left eye. Warm compresses recommended. HEENT exam stable. Fluorescein stain unremarkable. Ophthalmology  follow-up recommended in 1-2 weeks as needed.   Discussed physical exam and available lab work findings in clinic with patient.  Counseled patient regarding appropriate use of medications and potential side effects for all medications recommended or prescribed today. Discussed red flag signs and symptoms of worsening condition,when to call the PCP office, return to urgent care, and when to seek higher level of care in the emergency department. Patient verbalizes understanding and agreement with plan. All questions answered. Patient discharged in stable condition.    Final Clinical Impressions(s) / UC Diagnoses   Final diagnoses:  Corneal irritation of left eye     Discharge Instructions      Use erythromycin eye ointment to the left eye twice a day for 7 days.  Use warm compress to the left eye before using eye ointment.   Schedule an appointment with your eye doctor for follow-up as needed.  If you develop any new or worsening symptoms or do not  improve in the next 2 to 3 days, please return.  If your symptoms are severe, please go to the emergency room.  Follow-up with your primary care provider for further evaluation and management of your symptoms as well as ongoing wellness visits.  I hope you feel better! =    ED Prescriptions     Medication Sig Dispense Auth. Provider   erythromycin ophthalmic ointment Place a 1/2 inch ribbon of ointment into the lower eyelid. 3.5 g Carlisle Beers, FNP      PDMP not reviewed this encounter.   Carlisle Beers, [place] 03/24/23 2155

## 2023-04-15 ENCOUNTER — Ambulatory Visit (INDEPENDENT_AMBULATORY_CARE_PROVIDER_SITE_OTHER): Payer: Medicare Other

## 2023-04-15 VITALS — Wt 131.0 lb

## 2023-04-15 DIAGNOSIS — Z0000 Encounter for general adult medical examination without abnormal findings: Secondary | ICD-10-CM | POA: Diagnosis not present

## 2023-04-15 NOTE — Patient Instructions (Signed)
Holly Russell , Thank you for taking time to come for your Medicare Wellness Visit. I appreciate your ongoing commitment to your health goals. Please review the following plan we discussed and let me know if I can assist you in the future.   These are the goals we discussed:  Goals      Weight (lb) < 200 lb (90.7 kg)     Would like to lose 10 lbs        This is a list of the screening recommended for you and due dates:  Health Maintenance  Topic Date Due   Hepatitis C Screening  Never done   Zoster (Shingles) Vaccine (1 of 2) Never done   DTaP/Tdap/Td vaccine (2 - Td or Tdap) 06/01/2019   COVID-19 Vaccine (11 - 2023-24 season) 09/18/2022   Colon Cancer Screening  01/03/2023   Flu Shot  06/05/2023   Medicare Annual Wellness Visit  04/14/2024   Mammogram  01/13/2025   Pneumonia Vaccine  Completed   DEXA scan (bone density measurement)  Completed   HPV Vaccine  Aged Out    Advanced directives: Please bring a copy of your health care power of attorney and living will to the office at your convenience.  Conditions/risks identified: lose 10 lbs   Next appointment: Follow up in one year for your annual wellness visit    Preventive Care 65 Years and Older, Female Preventive care refers to lifestyle choices and visits with your health care provider that can promote health and wellness. What does preventive care include? A yearly physical exam. This is also called an annual well check. Dental exams once or twice a year. Routine eye exams. Ask your health care provider how often you should have your eyes checked. Personal lifestyle choices, including: Daily care of your teeth and gums. Regular physical activity. Eating a healthy diet. Avoiding tobacco and drug use. Limiting alcohol use. Practicing safe sex. Taking low-dose aspirin every day. Taking vitamin and mineral supplements as recommended by your health care provider. What happens during an annual well check? The services and  screenings done by your health care provider during your annual well check will depend on your age, overall health, lifestyle risk factors, and family history of disease. Counseling  Your health care provider may ask you questions about your: Alcohol use. Tobacco use. Drug use. Emotional well-being. Home and relationship well-being. Sexual activity. Eating habits. History of falls. Memory and ability to understand (cognition). Work and work Astronomer. Reproductive health. Screening  You may have the following tests or measurements: Height, weight, and BMI. Blood pressure. Lipid and cholesterol levels. These may be checked every 5 years, or more frequently if you are over 47 years old. Skin check. Lung cancer screening. You may have this screening every year starting at age 54 if you have a 30-pack-year history of smoking and currently smoke or have quit within the past 15 years. Fecal occult blood test (FOBT) of the stool. You may have this test every year starting at age 10. Flexible sigmoidoscopy or colonoscopy. You may have a sigmoidoscopy every 5 years or a colonoscopy every 10 years starting at age 22. Hepatitis C blood test. Hepatitis B blood test. Sexually transmitted disease (STD) testing. Diabetes screening. This is done by checking your blood sugar (glucose) after you have not eaten for a while (fasting). You may have this done every 1-3 years. Bone density scan. This is done to screen for osteoporosis. You may have this done starting at age  65. Mammogram. This may be done every 1-2 years. Talk to your health care provider about how often you should have regular mammograms. Talk with your health care provider about your test results, treatment options, and if necessary, the need for more tests. Vaccines  Your health care provider may recommend certain vaccines, such as: Influenza vaccine. This is recommended every year. Tetanus, diphtheria, and acellular pertussis (Tdap,  Td) vaccine. You may need a Td booster every 10 years. Zoster vaccine. You may need this after age 69. Pneumococcal 13-valent conjugate (PCV13) vaccine. One dose is recommended after age 34. Pneumococcal polysaccharide (PPSV23) vaccine. One dose is recommended after age 71. Talk to your health care provider about which screenings and vaccines you need and how often you need them. This information is not intended to replace advice given to you by your health care provider. Make sure you discuss any questions you have with your health care provider. Document Released: 11/17/2015 Document Revised: 07/10/2016 Document Reviewed: 08/22/2015 Elsevier Interactive Patient Education  2017 [employer].  Fall Prevention in the Home Falls can cause injuries. They can happen to people of all ages. There are many things you can do to make your home safe and to help prevent falls. What can I do on the outside of my home? Regularly fix the edges of walkways and driveways and fix any cracks. Remove anything that might make you trip as you walk through a door, such as a raised step or threshold. Trim any bushes or trees on the path to your home. Use bright outdoor lighting. Clear any walking paths of anything that might make someone trip, such as rocks or tools. Regularly check to see if handrails are loose or broken. Make sure that both sides of any steps have handrails. Any raised decks and porches should have guardrails on the edges. Have any leaves, snow, or ice cleared regularly. Use sand or salt on walking paths during winter. Clean up any spills in your garage right away. This includes oil or grease spills. What can I do in the bathroom? Use night lights. Install grab bars by the toilet and in the tub and shower. Do not use towel bars as grab bars. Use non-skid mats or decals in the tub or shower. If you need to sit down in the shower, use a plastic, non-slip stool. Keep the floor dry. Clean up any  water that spills on the floor as soon as it happens. Remove soap buildup in the tub or shower regularly. Attach bath mats securely with double-sided non-slip rug tape. Do not have throw rugs and other things on the floor that can make you trip. What can I do in the bedroom? Use night lights. Make sure that you have a light by your bed that is easy to reach. Do not use any sheets or blankets that are too big for your bed. They should not hang down onto the floor. Have a firm chair that has side arms. You can use this for support while you get dressed. Do not have throw rugs and other things on the floor that can make you trip. What can I do in the kitchen? Clean up any spills right away. Avoid walking on wet floors. Keep items that you use a lot in easy-to-reach places. If you need to reach something above you, use a strong step stool that has a grab bar. Keep electrical cords out of the way. Do not use floor polish or wax that makes floors slippery.  If you must use wax, use non-skid floor wax. Do not have throw rugs and other things on the floor that can make you trip. What can I do with my stairs? Do not leave any items on the stairs. Make sure that there are handrails on both sides of the stairs and use them. Fix handrails that are broken or loose. Make sure that handrails are as long as the stairways. Check any carpeting to make sure that it is firmly attached to the stairs. Fix any carpet that is loose or worn. Avoid having throw rugs at the top or bottom of the stairs. If you do have throw rugs, attach them to the floor with carpet tape. Make sure that you have a light switch at the top of the stairs and the bottom of the stairs. If you do not have them, ask someone to add them for you. What else can I do to help prevent falls? Wear shoes that: Do not have high heels. Have rubber bottoms. Are comfortable and fit you well. Are closed at the toe. Do not wear sandals. If you use a  stepladder: Make sure that it is fully opened. Do not climb a closed stepladder. Make sure that both sides of the stepladder are locked into place. Ask someone to hold it for you, if possible. Clearly mark and make sure that you can see: Any grab bars or handrails. First and last steps. Where the edge of each step is. Use tools that help you move around (mobility aids) if they are needed. These include: Canes. Walkers. Scooters. Crutches. Turn on the lights when you go into a dark area. Replace any light bulbs as soon as they burn out. Set up your furniture so you have a clear path. Avoid moving your furniture around. If any of your floors are uneven, fix them. If there are any pets around you, be aware of where they are. Review your medicines with your doctor. Some medicines can make you feel dizzy. This can increase your chance of falling. Ask your doctor what other things that you can do to help prevent falls. This information is not intended to replace advice given to you by your health care provider. Make sure you discuss any questions you have with your health care provider. Document Released: 08/17/2009 Document Revised: 03/28/2016 Document Reviewed: 11/25/2014 Elsevier Interactive Patient Education  2017 [employer].

## 2023-04-15 NOTE — Progress Notes (Signed)
I connected with  [name] on 04/15/23 by a audio enabled telemedicine application and verified that I am speaking with the correct person using two identifiers.  Patient Location: Home  Provider Location: Office/Clinic  I discussed the limitations of evaluation and management by telemedicine. The patient expressed understanding and agreed to proceed.   Patient Medicare AWV questionnaire was completed by the patient on 04/15/23; I have confirmed that all information answered by patient is correct and no changes since this date.      Subjective:   Holly Russell is a 72 y.o. female who presents for Medicare Annual (Subsequent) preventive examination.  Review of Systems     Cardiac Risk Factors include: advanced age (>56men, >39 women);hypertension     Objective:    Today's Vitals   04/15/23 1551  Weight: 131 lb (59.4 kg)   Body mass index is 25.58 kg/m.     04/15/2023    4:11 PM 09/24/2022   10:51 AM 05/08/2022   12:07 PM 03/07/2022    1:10 PM 09/15/2020   11:43 AM  Advanced Directives  Does Patient Have a Medical Advance Directive? Yes No No No Yes  Type of Estate agent of [place];Living will      Does patient want to make changes to medical advance directive?    No - Patient declined   Copy of Healthcare Power of Attorney in Chart? No - copy requested      Would patient like information on creating a medical advance directive?  No - Patient declined No - Patient declined      Current Medications (verified) Outpatient Encounter Medications as of 04/15/2023  Medication Sig   Acetaminophen (TYLENOL EXTRA STRENGTH PO) Take by mouth as needed.   aspirin EC 81 MG tablet Take 81 mg by mouth every Monday, Wednesday, and Friday. Swallow whole.   atorvastatin (LIPITOR) 40 MG tablet TAKE 1 TABLET BY MOUTH EVERY DAY   Calcium Carb-Cholecalciferol (CALTRATE 600+D3 PO) Take 600 mLs by mouth daily. 1-2 tablets daily   clotrimazole (LOTRIMIN) 1 % cream as  needed.   famotidine (PEPCID) 20 MG tablet Take 1 tablet (20 mg total) by mouth daily.   lisinopril (ZESTRIL) 10 MG tablet TAKE 1 TABLET BY MOUTH EVERY DAY   Polyethyl Glycol-Propyl Glycol (SYSTANE) 0.4-0.3 % SOLN as needed.   vitamin B-12 (CYANOCOBALAMIN) 1000 MCG tablet Take 1,000 mcg by mouth as needed.   clobetasol ointment (TEMOVATE) 0.05 % Apply topically as needed. (Patient not taking: Reported on 04/15/2023)   [DISCONTINUED] erythromycin ophthalmic ointment Place a 1/2 inch ribbon of ointment into the lower eyelid.   No facility-administered encounter medications on file as of 04/15/2023.    Allergies (verified) Patient has no known allergies.   History: Past Medical History:  Diagnosis Date   Acid reflux    Agatston CAC score 100-199    Basal cell carcinoma    Cataract    Coronary atherosclerosis    Deviated nasal septum    Eczema    Gastroesophageal reflux disease    H/O bone density study 2019-2015   H/O echocardiogram 2015   H/O exercise stress test 3/4-5 2015   Hearing loss    High blood pressure    High cholesterol    History of chest pain    History of prediabetes    Hx of mammogram 2018-2017-2016-2015-2014   Hyperlipidemia    IBS (irritable bowel syndrome)    Intervertebral disc disease    Iron deficiency anemia  On statin therapy    Osteoarthritis    Osteopenia    unspecified  Location   SNHL (sensorineural hearing loss)    Spinal stenosis    Tinnitus of left ear    Vitamin D deficiency    Past Surgical History:  Procedure Laterality Date   ENDOMETRIAL BIOPSY     EYE SURGERY     lazy eye when 72yo   MOHS SURGERY     for basal cell to the right of right eye   TONSILLECTOMY     Family History  Problem Relation Age of Onset   Stroke Mother 12   Basal cell carcinoma Mother    Hypertension Mother    Basal cell carcinoma Father    Hypertension Father    Varicose Veins Father    Breast cancer Paternal Aunt        56s   Heart attack Paternal  Grandfather    Heart disease Paternal Grandfather    Breast cancer Cousin        30s-40s   Cancer Maternal Grandfather    Depression Paternal Grandmother    Cancer Paternal Aunt    Social History   Socioeconomic History   Marital status: Married    Spouse name: Not on file   Number of children: 2   Years of education: Not on file   Highest education level: Not on file  Occupational History   Not on file  Tobacco Use   Smoking status: Never   Smokeless tobacco: Never  Vaping Use   Vaping Use: Never used  Substance and Sexual Activity   Alcohol use: Yes    Alcohol/week: 3.0 standard drinks of alcohol    Types: 3 Glasses of wine per week    Comment: 3-4   Drug use: Never   Sexual activity: Not Currently    Partners: Male    Birth control/protection: Post-menopausal  Other Topics Concern   Not on file  Social History Narrative   Diet: Plant Based and Fish      Do you drink/ eat things with caffeine?  Yes      Marital status:  Married       2 children, 1 grand            What year were you married ? 1976      Do you live in a house, apartment,assistred living, condo, trailer, etc.)? Condo-Town House      Is it one or more stories? 2      How many persons live in your home ? 2      Do you have any pets in your home ?(please list) No      Highest Level of education completed: Law School      Current or past profession: Attorney      Do you exercise?     Yes                         Type & how often Walk-3 miles daily      ADVANCED DIRECTIVES (Please bring copies)      Do you have a living will? Yes      Do you have a DNR form?   Yes                    If not, do you want to discuss one?       Do you have signed POA?HPOA forms?  Yes  If so, please bring to your appointment      FUNCTIONAL STATUS- To be completed by Spouse / child / Staff       Do you have difficulty bathing or dressing yourself ?  No      Do you have difficulty preparing food or  eating ?  No      Do you have difficulty managing your mediation ?  No      Do you have difficulty managing your finances ?  No      Do you have difficulty affording your medication ?  No      Social Determinants of Health   Financial Resource Strain: Low Risk  (04/15/2023)   Overall Financial Resource Strain (CARDIA)    Difficulty of Paying Living Expenses: Not hard at all  Food Insecurity: No Food Insecurity (04/15/2023)   Hunger Vital Sign    Worried About Running Out of Food in the Last Year: Never true    Ran Out of Food in the Last Year: Never true  Transportation Needs: No Transportation Needs (04/15/2023)   PRAPARE - Administrator, Civil Service (Medical): No    Lack of Transportation (Non-Medical): No  Physical Activity: Sufficiently Active (04/15/2023)   Exercise Vital Sign    Days of Exercise per Week: 6 days    Minutes of Exercise per Session: 60 min  Stress: No Stress Concern Present (04/15/2023)   [employer] of Occupational Health - Occupational Stress Questionnaire    Feeling of Stress : Only a little  Social Connections: Moderately Integrated (04/15/2023)   Social Connection and Isolation Panel [NHANES]    Frequency of Communication with Friends and Family: Twice a week    Frequency of Social Gatherings with Friends and Family: Once a week    Attends Religious Services: Never    Database administrator or Organizations: Yes    Attends Engineer, structural: More than 4 times per year    Marital Status: Married    Tobacco Counseling Counseling given: Not Answered   Clinical Intake:  Pre-visit preparation completed: Yes  Pain : No/denies pain     BMI - recorded: 25.58 Nutritional Status: BMI 25 -29 Overweight Nutritional Risks: None Diabetes: No  How often do you need to have someone help you when you read instructions, pamphlets, or other written materials from your doctor or pharmacy?: 1 - Never  Diabetic?no  Interpreter  Needed?: No  Information entered by :: Lanier Ensign, LPN   Activities of Daily Living    04/15/2023    1:14 PM  In your present state of health, do you have any difficulty performing the following activities:  Hearing? 1  Comment wears hearing aids  Vision? 0  Difficulty concentrating or making decisions? 0  Walking or climbing stairs? 0  Dressing or bathing? 0  Doing errands, shopping? 0  Preparing Food and eating ? N  Using the Toilet? N  In the past six months, have you accidently leaked urine? Y  Comment pads at times  Do you have problems with loss of bowel control? N  Managing your Medications? N  Managing your Finances? N  Housekeeping or managing your Housekeeping? N    Patient Care Team: Jeani Sow, MD as PCP - General (Family Medicine) Clarita Crane, NP as Nurse Practitioner (Nurse Practitioner)  Indicate any recent Medical Services you may have received from other than [facility] providers in the past year (date may be approximate).  Assessment:   This is a routine wellness examination for Taliana.  Hearing/Vision screen Hearing Screening - Comments:: Pt wears hearing aid  Vision Screening - Comments:: Pt follows up with Dr Burgess Estelle for annual eye exams   Dietary issues and exercise activities discussed: Current Exercise Habits: Home exercise routine, Type of exercise: walking, Time (Minutes): 60, Frequency (Times/Week): 6, Weekly Exercise (Minutes/Week): 360   Goals Addressed             This Visit's Progress    Patient Stated       Lose 10 lbs        Depression Screen    04/15/2023    4:10 PM 03/07/2022    1:12 PM 10/18/2021    9:15 AM 09/15/2020   11:37 AM  PHQ 2/9 Scores  PHQ - 2 Score 0 0 0 0    Fall Risk    04/15/2023    1:14 PM 09/04/2022    2:04 PM 05/09/2022    9:26 AM 03/07/2022    1:39 PM 03/07/2022    1:11 PM  Fall Risk   Falls in the past year? 0 0 1 0 0  Number falls in past yr: 0 0 1 0 0  Injury with Fall? 0 0 1 0 0  Risk  for fall due to : Impaired vision;Impaired balance/gait No Fall Risks History of fall(s) History of fall(s) No Fall Risks  Follow up Falls prevention discussed Falls evaluation completed Falls evaluation completed Falls evaluation completed;Education provided;Falls prevention discussed Falls evaluation completed    FALL RISK PREVENTION PERTAINING TO THE HOME:  Any stairs in or around the home? Yes  If so, are there any without handrails? No  Home free of loose throw rugs in walkways, pet beds, electrical cords, etc? Yes  Adequate lighting in your home to reduce risk of falls? Yes   ASSISTIVE DEVICES UTILIZED TO PREVENT FALLS:  Life alert? No  Use of a cane, walker or w/c? No  Grab bars in the bathroom? Yes  Shower chair or bench in shower? Yes  Elevated toilet seat or a handicapped toilet? Yes   TIMED UP AND GO:  Was the test performed? No .   Cognitive Function:DECLINED     03/07/2022    1:13 PM  MMSE - Mini Mental State Exam  Orientation to time 5  Orientation to Place 5  Registration 3  Attention/ Calculation 5  Recall 2  Language- name 2 objects 2  Language- repeat 1  Language- follow 3 step command 3  Language- read & follow direction 1  Write a sentence 1  Copy design 1  Total score 29        Immunizations Immunization History  Administered Date(s) Administered   Fluad Quad(high Dose 65+) 07/11/2021   Influenza, High Dose Seasonal PF 07/27/2020, 07/24/2022   Influenza-Unspecified 09/10/2014, 08/19/2015, 08/10/2016, 09/02/2017, 08/14/2018   PFIZER Comirnaty(Gray Top)Covid-19 Tri-Sucrose Vaccine 11/27/2019, 12/22/2019, 07/27/2020   PFIZER(Purple Top)SARS-COV-2 Vaccination 11/27/2019, 12/22/2019, 07/27/2020, 02/09/2021, 07/24/2022   Pfizer Covid-19 Vaccine Bivalent Booster 57yrs & up 07/11/2021, 03/04/2022   Pneumococcal Conjugate-13 08/19/2015   Pneumococcal Polysaccharide-23 11/27/2016   Respiratory Syncytial Virus Vaccine,Recomb Aduvanted(Arexvy) 07/24/2022    Tdap 05/31/2009   Zoster, Live 11/29/2014    TDAP status: Due, Education has been provided regarding the importance of this vaccine. Advised may receive this vaccine at local pharmacy or Health Dept. Aware to provide a copy of the vaccination record if obtained from local pharmacy or Health Dept. Verbalized acceptance and understanding.  Flu Vaccine status: Up to date  Pneumococcal vaccine status: Up to date  Covid-19 vaccine status: Completed vaccines  Qualifies for Shingles Vaccine? Yes   Zostavax completed No   Shingrix Completed?: No.    Education has been provided regarding the importance of this vaccine. Patient has been advised to call insurance company to determine out of pocket expense if they have not yet received this vaccine. Advised may also receive vaccine at local pharmacy or Health Dept. Verbalized acceptance and understanding.  Screening Tests Health Maintenance  Topic Date Due   Hepatitis C Screening  Never done   Zoster Vaccines- Shingrix (1 of 2) Never done   DTaP/Tdap/Td (2 - Td or Tdap) 06/01/2019   COVID-19 Vaccine (11 - 2023-24 season) 09/18/2022   Colonoscopy  04/14/2024 (Originally 01/03/2023)   INFLUENZA VACCINE  06/05/2023   Medicare Annual Wellness (AWV)  04/14/2024   MAMMOGRAM  01/13/2025   Pneumonia Vaccine 51+ Years old  Completed   DEXA SCAN  Completed   HPV VACCINES  Aged Out    Health Maintenance  Health Maintenance Due  Topic Date Due   Hepatitis C Screening  Never done   Zoster Vaccines- Shingrix (1 of 2) Never done   DTaP/Tdap/Td (2 - Td or Tdap) 06/01/2019   COVID-19 Vaccine (11 - 2023-24 season) 09/18/2022    Colorectal cancer screening: Type of screening: Colonoscopy. Completed 01/02/13. Repeat every 10 years pt stated not at this time   Mammogram status: Completed 01/14/23. Repeat every year  Bone Density status: Completed 04/05/22. Results reflect: Bone density results: OSTEOPENIA. Repeat every 2 years.   Additional  Screening:  Hepatitis C Screening: does qualify  Vision Screening: Recommended annual ophthalmology exams for early detection of glaucoma and other disorders of the eye. Is the patient up to date with their annual eye exam?  Yes  Who is the provider or what is the name of the office in which the patient attends annual eye exams? Dr Burgess Estelle  If pt is not established with a provider, would they like to be referred to a provider to establish care? No .   Dental Screening: Recommended annual dental exams for proper oral hygiene  Community Resource Referral / Chronic Care Management: CRR required this visit?  No   CCM required this visit?  No      Plan:     I have personally reviewed and noted the following in the patient's chart:   Medical and social history Use of alcohol, tobacco or illicit drugs  Current medications and supplements including opioid prescriptions. Patient is not currently taking opioid prescriptions. Functional ability and status Nutritional status Physical activity Advanced directives List of other physicians Hospitalizations, surgeries, and ER visits in previous 12 months Vitals Screenings to include cognitive, depression, and falls Referrals and appointments  In addition, I have reviewed and discussed with patient certain preventive protocols, quality metrics, and best practice recommendations. A written personalized care plan for preventive services as well as general preventive health recommendations were provided to patient.     Marzella Schlein, LPN   [id number]/61/0960   Nurse Notes:Pt declined cognition testing at this time, pt was knowledgeable of questions asked

## 2023-04-28 ENCOUNTER — Telehealth: Payer: Self-pay

## 2023-04-28 NOTE — Progress Notes (Signed)
I connected with  Holly Russell on 04/28/23 by a audio enabled telemedicine application and verified that I am speaking with the correct person using two identifiers.  Patient Location: Home  Provider Location: Office/Clinic  I discussed the limitations of evaluation and management by telemedicine. The patient expressed understanding and agreed to proceed.   Patient Medicare AWV questionnaire was completed by the patient on 04/15/23; I have confirmed that all information answered by patient is correct and no changes since this date.      Subjective:   Holly Russell is a 72 y.o. female who presents for Medicare Annual (Subsequent) preventive examination.  Review of Systems     Cardiac Risk Factors include: advanced age (>65men, >32 women);hypertension     Objective:    Today's Vitals   04/15/23 1551  Weight: 131 lb (59.4 kg)   Body mass index is 25.58 kg/m.     04/15/2023    4:11 PM 09/24/2022   10:51 AM 05/08/2022   12:07 PM 03/07/2022    1:10 PM 09/15/2020   11:43 AM  Advanced Directives  Does Patient Have a Medical Advance Directive? Yes No No No Yes  Type of Estate agent of [place];Living will      Does patient want to make changes to medical advance directive?    No - Patient declined   Copy of Healthcare Power of Attorney in Chart? No - copy requested      Would patient like information on creating a medical advance directive?  No - Patient declined No - Patient declined      Current Medications (verified) Outpatient Encounter Medications as of 04/15/2023  Medication Sig   Acetaminophen (TYLENOL EXTRA STRENGTH PO) Take by mouth as needed.   aspirin EC 81 MG tablet Take 81 mg by mouth every Monday, Wednesday, and Friday. Swallow whole.   atorvastatin (LIPITOR) 40 MG tablet TAKE 1 TABLET BY MOUTH EVERY DAY   Calcium Carb-Cholecalciferol (CALTRATE 600+D3 PO) Take 600 mLs by mouth daily. 1-2 tablets daily   clotrimazole (LOTRIMIN) 1 % cream as  needed.   famotidine (PEPCID) 20 MG tablet Take 1 tablet (20 mg total) by mouth daily.   lisinopril (ZESTRIL) 10 MG tablet TAKE 1 TABLET BY MOUTH EVERY DAY   Polyethyl Glycol-Propyl Glycol (SYSTANE) 0.4-0.3 % SOLN as needed.   vitamin B-12 (CYANOCOBALAMIN) 1000 MCG tablet Take 1,000 mcg by mouth as needed.   clobetasol ointment (TEMOVATE) 0.05 % Apply topically as needed. (Patient not taking: Reported on 04/15/2023)   [DISCONTINUED] erythromycin ophthalmic ointment Place a 1/2 inch ribbon of ointment into the lower eyelid.   No facility-administered encounter medications on file as of 04/15/2023.    Allergies (verified) Patient has no known allergies.   History: Past Medical History:  Diagnosis Date   Acid reflux    Agatston CAC score 100-199    Basal cell carcinoma    Cataract    Coronary atherosclerosis    Deviated nasal septum    Eczema    Gastroesophageal reflux disease    H/O bone density study 2019-2015   H/O echocardiogram 2015   H/O exercise stress test 3/4-5 2015   Hearing loss    High blood pressure    High cholesterol    History of chest pain    History of prediabetes    Hx of mammogram 2018-2017-2016-2015-2014   Hyperlipidemia    IBS (irritable bowel syndrome)    Intervertebral disc disease    Iron deficiency anemia  On statin therapy    Osteoarthritis    Osteopenia    unspecified  Location   SNHL (sensorineural hearing loss)    Spinal stenosis    Tinnitus of left ear    Vitamin D deficiency    Past Surgical History:  Procedure Laterality Date   ENDOMETRIAL BIOPSY     EYE SURGERY     lazy eye when 72yo   MOHS SURGERY     for basal cell to the right of right eye   TONSILLECTOMY     Family History  Problem Relation Age of Onset   Stroke Mother 12   Basal cell carcinoma Mother    Hypertension Mother    Basal cell carcinoma Father    Hypertension Father    Varicose Veins Father    Breast cancer Paternal Aunt        56s   Heart attack Paternal  Grandfather    Heart disease Paternal Grandfather    Breast cancer Cousin        30s-40s   Cancer Maternal Grandfather    Depression Paternal Grandmother    Cancer Paternal Aunt    Social History   Socioeconomic History   Marital status: Married    Spouse name: Not on file   Number of children: 2   Years of education: Not on file   Highest education level: Not on file  Occupational History   Not on file  Tobacco Use   Smoking status: Never   Smokeless tobacco: Never  Vaping Use   Vaping Use: Never used  Substance and Sexual Activity   Alcohol use: Yes    Alcohol/week: 3.0 standard drinks of alcohol    Types: 3 Glasses of wine per week    Comment: 3-4   Drug use: Never   Sexual activity: Not Currently    Partners: Male    Birth control/protection: Post-menopausal  Other Topics Concern   Not on file  Social History Narrative   Diet: Plant Based and Fish      Do you drink/ eat things with caffeine?  Yes      Marital status:  Married       2 children, 1 grand            What year were you married ? 1976      Do you live in a house, apartment,assistred living, condo, trailer, etc.)? Condo-Town House      Is it one or more stories? 2      How many persons live in your home ? 2      Do you have any pets in your home ?(please list) No      Highest Level of education completed: Law School      Current or past profession: Attorney      Do you exercise?     Yes                         Type & how often Walk-3 miles daily      ADVANCED DIRECTIVES (Please bring copies)      Do you have a living will? Yes      Do you have a DNR form?   Yes                    If not, do you want to discuss one?       Do you have signed POA?HPOA forms?  Yes  If so, please bring to your appointment      FUNCTIONAL STATUS- To be completed by Spouse / child / Staff       Do you have difficulty bathing or dressing yourself ?  No      Do you have difficulty preparing food or  eating ?  No      Do you have difficulty managing your mediation ?  No      Do you have difficulty managing your finances ?  No      Do you have difficulty affording your medication ?  No      Social Determinants of Health   Financial Resource Strain: Low Risk  (04/15/2023)   Overall Financial Resource Strain (CARDIA)    Difficulty of Paying Living Expenses: Not hard at all  Food Insecurity: No Food Insecurity (04/15/2023)   Hunger Vital Sign    Worried About Running Out of Food in the Last Year: Never true    Ran Out of Food in the Last Year: Never true  Transportation Needs: No Transportation Needs (04/15/2023)   PRAPARE - Administrator, Civil Service (Medical): No    Lack of Transportation (Non-Medical): No  Physical Activity: Sufficiently Active (04/15/2023)   Exercise Vital Sign    Days of Exercise per Week: 6 days    Minutes of Exercise per Session: 60 min  Stress: No Stress Concern Present (04/15/2023)   [employer] of Occupational Health - Occupational Stress Questionnaire    Feeling of Stress : Only a little  Social Connections: Moderately Integrated (04/15/2023)   Social Connection and Isolation Panel [NHANES]    Frequency of Communication with Friends and Family: Twice a week    Frequency of Social Gatherings with Friends and Family: Once a week    Attends Religious Services: Never    Database administrator or Organizations: Yes    Attends Engineer, structural: More than 4 times per year    Marital Status: Married    Tobacco Counseling Counseling given: Not Answered   Clinical Intake:  Pre-visit preparation completed: Yes  Pain : No/denies pain     BMI - recorded: 25.58 Nutritional Status: BMI 25 -29 Overweight Nutritional Risks: None Diabetes: No  How often do you need to have someone help you when you read instructions, pamphlets, or other written materials from your doctor or pharmacy?: 1 - Never  Diabetic?no  Interpreter  Needed?: No  Information entered by :: Lanier Ensign, LPN   Activities of Daily Living    04/15/2023    1:14 PM  In your present state of health, do you have any difficulty performing the following activities:  Hearing? 1  Comment wears hearing aids  Vision? 0  Difficulty concentrating or making decisions? 0  Walking or climbing stairs? 0  Dressing or bathing? 0  Doing errands, shopping? 0  Preparing Food and eating ? N  Using the Toilet? N  In the past six months, have you accidently leaked urine? Y  Comment pads at times  Do you have problems with loss of bowel control? N  Managing your Medications? N  Managing your Finances? N  Housekeeping or managing your Housekeeping? N    Patient Care Team: Jeani Sow, MD as PCP - General (Family Medicine) Clarita Crane, NP as Nurse Practitioner (Nurse Practitioner)  Indicate any recent Medical Services you may have received from other than [facility] providers in the past year (date may be approximate).  Assessment:   This is a routine wellness examination for Holly Russell.  Hearing/Vision screen Hearing Screening - Comments:: Pt wears hearing aid  Vision Screening - Comments:: Pt follows up with Dr Burgess Estelle for annual eye exams   Dietary issues and exercise activities discussed: Current Exercise Habits: Home exercise routine, Type of exercise: walking, Time (Minutes): 60, Frequency (Times/Week): 6, Weekly Exercise (Minutes/Week): 360   Goals Addressed               This Visit's Progress     Patient Stated        Lose 10 lbs       COMPLETED: Weight (lb) < 121 lb (54.9 kg) (pt-stated)   131 lb (59.4 kg)     Would like to lose 10 lbs      Depression Screen    04/15/2023    4:10 PM 03/07/2022    1:12 PM 10/18/2021    9:15 AM 09/15/2020   11:37 AM  PHQ 2/9 Scores  PHQ - 2 Score 0 0 0 0    Fall Risk    04/15/2023    1:14 PM 09/04/2022    2:04 PM 05/09/2022    9:26 AM 03/07/2022    1:39 PM 03/07/2022    1:11 PM  Fall  Risk   Falls in the past year? 0 0 1 0 0  Number falls in past yr: 0 0 1 0 0  Injury with Fall? 0 0 1 0 0  Risk for fall due to : Impaired vision;Impaired balance/gait No Fall Risks History of fall(s) History of fall(s) No Fall Risks  Follow up Falls prevention discussed Falls evaluation completed Falls evaluation completed Falls evaluation completed;Education provided;Falls prevention discussed Falls evaluation completed    FALL RISK PREVENTION PERTAINING TO THE HOME:  Any stairs in or around the home? Yes  If so, are there any without handrails? No  Home free of loose throw rugs in walkways, pet beds, electrical cords, etc? Yes  Adequate lighting in your home to reduce risk of falls? Yes   ASSISTIVE DEVICES UTILIZED TO PREVENT FALLS:  Life alert? No  Use of a cane, walker or w/c? No  Grab bars in the bathroom? Yes  Shower chair or bench in shower? Yes  Elevated toilet seat or a handicapped toilet? Yes   TIMED UP AND GO:  Was the test performed? No .   Cognitive Function:DECLINED     03/07/2022    1:13 PM  MMSE - Mini Mental State Exam  Orientation to time 5  Orientation to Place 5  Registration 3  Attention/ Calculation 5  Recall 2  Language- name 2 objects 2  Language- repeat 1  Language- follow 3 step command 3  Language- read & follow direction 1  Write a sentence 1  Copy design 1  Total score 29        Immunizations Immunization History  Administered Date(s) Administered   Fluad Quad(high Dose 65+) 07/11/2021   Influenza, High Dose Seasonal PF 07/27/2020, 07/24/2022   Influenza-Unspecified 09/10/2014, 08/19/2015, 08/10/2016, 09/02/2017, 08/14/2018   PFIZER Comirnaty(Gray Top)Covid-19 Tri-Sucrose Vaccine 11/27/2019, 12/22/2019, 07/27/2020   PFIZER(Purple Top)SARS-COV-2 Vaccination 11/27/2019, 12/22/2019, 07/27/2020, 02/09/2021, 07/24/2022   Pfizer Covid-19 Vaccine Bivalent Booster 49yrs & up 07/11/2021, 03/04/2022   Pneumococcal Conjugate-13 08/19/2015    Pneumococcal Polysaccharide-23 11/27/2016   Respiratory Syncytial Virus Vaccine,Recomb Aduvanted(Arexvy) 07/24/2022   Tdap 05/31/2009   Zoster, Live 11/29/2014    TDAP status: Due, Education has been provided regarding the importance of this  vaccine. Advised may receive this vaccine at local pharmacy or Health Dept. Aware to provide a copy of the vaccination record if obtained from local pharmacy or Health Dept. Verbalized acceptance and understanding.  Flu Vaccine status: Up to date  Pneumococcal vaccine status: Up to date  Covid-19 vaccine status: Completed vaccines  Qualifies for Shingles Vaccine? Yes   Zostavax completed No   Shingrix Completed?: No.    Education has been provided regarding the importance of this vaccine. Patient has been advised to call insurance company to determine out of pocket expense if they have not yet received this vaccine. Advised may also receive vaccine at local pharmacy or Health Dept. Verbalized acceptance and understanding.  Screening Tests Health Maintenance  Topic Date Due   Hepatitis C Screening  Never done   Zoster Vaccines- Shingrix (1 of 2) Never done   DTaP/Tdap/Td (2 - Td or Tdap) 06/01/2019   COVID-19 Vaccine (11 - 2023-24 season) 09/18/2022   Colonoscopy  04/14/2024 (Originally 01/03/2023)   INFLUENZA VACCINE  06/05/2023   Medicare Annual Wellness (AWV)  04/14/2024   MAMMOGRAM  01/13/2025   Pneumonia Vaccine 11+ Years old  Completed   DEXA SCAN  Completed   HPV VACCINES  Aged Out    Health Maintenance  Health Maintenance Due  Topic Date Due   Hepatitis C Screening  Never done   Zoster Vaccines- Shingrix (1 of 2) Never done   DTaP/Tdap/Td (2 - Td or Tdap) 06/01/2019   COVID-19 Vaccine (11 - 2023-24 season) 09/18/2022    Colorectal cancer screening: Type of screening: Colonoscopy. Completed 01/02/13. Repeat every 10 years pt stated not at this time   Mammogram status: Completed 01/14/23. Repeat every year  Bone Density status:  Completed 04/05/22. Results reflect: Bone density results: OSTEOPENIA. Repeat every 2 years.   Additional Screening:  Hepatitis C Screening: does qualify  Vision Screening: Recommended annual ophthalmology exams for early detection of glaucoma and other disorders of the eye. Is the patient up to date with their annual eye exam?  Yes  Who is the provider or what is the name of the office in which the patient attends annual eye exams? Dr Burgess Estelle  If pt is not established with a provider, would they like to be referred to a provider to establish care? No .   Dental Screening: Recommended annual dental exams for proper oral hygiene  Community Resource Referral / Chronic Care Management: CRR required this visit?  No   CCM required this visit?  No      Plan:     I have personally reviewed and noted the following in the patient's chart:   Medical and social history Use of alcohol, tobacco or illicit drugs  Current medications and supplements including opioid prescriptions. Patient is not currently taking opioid prescriptions. Functional ability and status Nutritional status Physical activity Advanced directives List of other physicians Hospitalizations, surgeries, and ER visits in previous 12 months Vitals Screenings to include cognitive, depression, and falls Referrals and appointments  In addition, I have reviewed and discussed with patient certain preventive protocols, quality metrics, and best practice recommendations. A written personalized care plan for preventive services as well as general preventive health recommendations were provided to patient.     Marzella Schlein, LPN   1/61/0960   Nurse Notes:Pt declined cognition testing at this time, pt was knowledgeable of questions asked, changed goal weight to 121 lbs

## 2023-04-28 NOTE — Telephone Encounter (Signed)
LVM for pt to call back to discuss goals

## 2023-05-09 ENCOUNTER — Ambulatory Visit (INDEPENDENT_AMBULATORY_CARE_PROVIDER_SITE_OTHER): Payer: Medicare Other | Admitting: Family Medicine

## 2023-05-09 ENCOUNTER — Encounter: Payer: Self-pay | Admitting: Family Medicine

## 2023-05-09 VITALS — BP 130/70 | HR 63 | Temp 98.6°F | Resp 16 | Ht 60.5 in | Wt 132.4 lb

## 2023-05-09 DIAGNOSIS — M810 Age-related osteoporosis without current pathological fracture: Secondary | ICD-10-CM

## 2023-05-09 DIAGNOSIS — E538 Deficiency of other specified B group vitamins: Secondary | ICD-10-CM | POA: Diagnosis not present

## 2023-05-09 DIAGNOSIS — R7303 Prediabetes: Secondary | ICD-10-CM | POA: Diagnosis not present

## 2023-05-09 DIAGNOSIS — E7800 Pure hypercholesterolemia, unspecified: Secondary | ICD-10-CM

## 2023-05-09 DIAGNOSIS — R918 Other nonspecific abnormal finding of lung field: Secondary | ICD-10-CM | POA: Insufficient documentation

## 2023-05-09 DIAGNOSIS — I10 Essential (primary) hypertension: Secondary | ICD-10-CM | POA: Diagnosis not present

## 2023-05-09 DIAGNOSIS — Z1159 Encounter for screening for other viral diseases: Secondary | ICD-10-CM

## 2023-05-09 LAB — LIPID PANEL
Cholesterol: 259 mg/dL — ABNORMAL HIGH (ref 0–200)
HDL: 120.1 mg/dL (ref >39.00)
LDL Cholesterol: 129 mg/dL — ABNORMAL HIGH (ref 0–99)
NonHDL: 139.24
Total CHOL/HDL Ratio: 2
Triglycerides: 49 mg/dL (ref 0.0–149.0)
VLDL: 9.8 mg/dL (ref 0.0–40.0)

## 2023-05-09 LAB — COMPREHENSIVE METABOLIC PANEL
ALT: 16 U/L (ref 0–35)
AST: 17 U/L (ref 0–37)
Albumin: 4.3 g/dL (ref 3.5–5.2)
Alkaline Phosphatase: 57 U/L (ref 39–117)
BUN: 21 mg/dL (ref 6–23)
CO2: 24 mEq/L (ref 19–32)
Calcium: 9.6 mg/dL (ref 8.4–10.5)
Chloride: 103 mEq/L (ref 96–112)
Creatinine, Ser: 0.84 mg/dL (ref 0.40–1.20)
GFR: 68.96 mL/min (ref >60.00)
Glucose, Bld: 91 mg/dL (ref 70–99)
Potassium: 4.2 mEq/L (ref 3.5–5.1)
Sodium: 137 mEq/L (ref 135–145)
Total Bilirubin: 0.9 mg/dL (ref 0.2–1.2)
Total Protein: 6.5 g/dL (ref 6.0–8.3)

## 2023-05-09 LAB — VITAMIN B12: Vitamin B-12: 509 pg/mL (ref 211–911)

## 2023-05-09 LAB — HEMOGLOBIN A1C: Hgb A1c MFr Bld: 5.3 % (ref 4.6–6.5)

## 2023-05-09 MED ORDER — LISINOPRIL 10 MG PO TABS: 10.0000 mg | ORAL_TABLET | Freq: Every day | ORAL | 3 refills | Status: DC

## 2023-05-09 MED ORDER — ATORVASTATIN CALCIUM 40 MG PO TABS: 40.0000 mg | ORAL_TABLET | Freq: Every day | ORAL | 3 refills | Status: DC

## 2023-05-09 NOTE — Assessment & Plan Note (Signed)
Chronic.  Not well controlled but ratios ok.  Continue atorvastatin 40mg  daily

## 2023-05-09 NOTE — Assessment & Plan Note (Signed)
Chronic. Stable.  Discussed guidelines.  Check CT 1 year.

## 2023-05-09 NOTE — Assessment & Plan Note (Signed)
Chronic.  Exercises.  Takes calcium/D.

## 2023-05-09 NOTE — Progress Notes (Signed)
Subjective:     Patient ID: Holly Russell, female    DOB: 1950/11/16, 72 y.o.   MRN: [id number]  Chief Complaint  Patient presents with   Medical Management of Chronic Issues    6 month follow-up on htn Fasting     HPI  HTN-Pt is on lisinopril 10mg   Bp's running not checking.  Saw urol and 152/85  No ha/dizziness/cp/palp/edema/cough/sob.   1 episode of dizziness when stood up-lasted few seconds.  Doesn't drink a lot of water.   HLD-on atorvastatin 40mg .  No SE. B12 deficiency-pescitarian.  Not on supps PreDM-working on diet/exercise.-walks 3 miles most days Osteoporosis-taking calcium/D-active. Eczema-clobetasol works  Lung nodules - per rad f/u 2 yrs.  Inquiring about protocol GERD-not occurring much any more-working on dietary changes.  Presents w/CP and water resolves. Saw Card in past.    Health Maintenance Due  Topic Date Due   Hepatitis C Screening  Never done   DTaP/Tdap/Td (2 - Td or Tdap) 06/01/2019    Past Medical History:  Diagnosis Date   Acid reflux    Agatston CAC score 100-199    Basal cell carcinoma    Cataract    Coronary atherosclerosis    Deviated nasal septum    Eczema    Gastroesophageal reflux disease    H/O bone density study 2019-2015   H/O echocardiogram 2015   H/O exercise stress test 3/4-5 2015   Hearing loss    High blood pressure    High cholesterol    History of chest pain    History of prediabetes    Hx of mammogram 2018-2017-2016-2015-2014   Hyperlipidemia    IBS (irritable bowel syndrome)    Intervertebral disc disease    Iron deficiency anemia    On statin therapy    Osteoarthritis    Osteopenia    unspecified  Location   SNHL (sensorineural hearing loss)    Spinal stenosis    Tinnitus of left ear    Vitamin D deficiency     Past Surgical History:  Procedure Laterality Date   ENDOMETRIAL BIOPSY     EYE SURGERY     lazy eye when 72yo   MOHS SURGERY     for basal cell to the right of right eye   TONSILLECTOMY        Current Outpatient Medications:    Acetaminophen (TYLENOL EXTRA STRENGTH PO), Take by mouth as needed., Disp: , Rfl:    aspirin EC 81 MG tablet, Take 81 mg by mouth every Monday, Wednesday, and Friday. Swallow whole., Disp: , Rfl:    Calcium Carb-Cholecalciferol (CALTRATE 600+D3 PO), Take 600 mLs by mouth daily. 1-2 tablets daily, Disp: , Rfl:    famotidine (PEPCID) 20 MG tablet, Take 1 tablet (20 mg total) by mouth daily., Disp: 90 tablet, Rfl: 3   Polyethyl Glycol-Propyl Glycol (SYSTANE) 0.4-0.3 % SOLN, as needed., Disp: , Rfl:    vitamin B-12 (CYANOCOBALAMIN) 1000 MCG tablet, Take 1,000 mcg by mouth as needed., Disp: , Rfl:    atorvastatin (LIPITOR) 40 MG tablet, Take 1 tablet (40 mg total) by mouth daily., Disp: 90 tablet, Rfl: 3   clobetasol ointment (TEMOVATE) 0.05 %, Apply topically as needed. (Patient not taking: Reported on 05/09/2023), Disp: , Rfl:    clotrimazole (LOTRIMIN) 1 % cream, as needed. (Patient not taking: Reported on 05/09/2023), Disp: , Rfl:    lisinopril (ZESTRIL) 10 MG tablet, Take 1 tablet (10 mg total) by mouth daily., Disp: 90 tablet, Rfl: 3  No Known Allergies ROS neg/noncontributory except as noted HPI/below Gyn 2 yrs ago. Does have some prolapse and has issues w/urination.  Microhematuria-sees urol. Knees bothering her at times-to get up, down stairs, etc.         Objective:     BP 130/70   Pulse 63   Temp 98.6 F (37 C) (Temporal)   Resp 16   Ht 5' 0.5" (1.537 m)   Wt 132 lb 6 oz (60 kg)   SpO2 99%   BMI 25.43 kg/m  Wt Readings from Last 3 Encounters:  05/09/23 132 lb 6 oz (60 kg)  04/15/23 131 lb (59.4 kg)  03/16/23 131 lb (59.4 kg)    Physical Exam   Gen: WDWN NAD HEENT: NCAT, conjunctiva not injected, sclera nonicteric NECK:  supple, no thyromegaly, no nodes, no carotid bruits CARDIAC: RRR, S1S2+, no murmur. DP 2+B LUNGS: CTAB. No wheezes ABDOMEN:  BS+, soft, NTND, No HSM, no masses EXT:  no edema MSK: no gross abnormalities.   NEURO: A&O x3.  CN II-XII intact.  PSYCH: normal mood. Good eye contact     Assessment & Plan:  Essential (primary) hypertension Assessment & Plan: Chronic.  Controlled.  Continue lisinopril 10mg  daily.  Check bp's at least monthly  Orders: -     Lisinopril; Take 1 tablet (10 mg total) by mouth daily.  Dispense: 90 tablet; Refill: 3  Pure hypercholesterolemia Assessment & Plan: Chronic.  Not well controlled but ratios ok.  Continue atorvastatin 40mg  daily  Orders: -     Comprehensive metabolic panel -     Lipid panel -     Atorvastatin Calcium; Take 1 tablet (40 mg total) by mouth daily.  Dispense: 90 tablet; Refill: 3  Prediabetes Assessment & Plan: Chronic.  Controlled.  Continue diet/exercise  Orders: -     Hemoglobin A1c  B12 deficiency Assessment & Plan: Chronic.  Not on supps.  Check levels  Orders: -     Vitamin B12  Screening for viral disease -     Hepatitis C antibody  Lung nodules Assessment & Plan: Chronic. Stable.  Discussed guidelines.  Check CT 1 year.    Age-related osteoporosis without current pathological fracture Assessment & Plan: Chronic.  Exercises.  Takes calcium/D.     Return in about 6 months (around 11/09/2023) for HTN, cholesterol.  Angelena Sole, MD

## 2023-05-09 NOTE — Assessment & Plan Note (Signed)
Chronic.  Not on supps.  Check levels

## 2023-05-09 NOTE — Patient Instructions (Addendum)
It was very nice to see you today!  Set phone alarm daily for calcium  From the Fleischner Society 2017; Radiology 2017; [phone].   Dr. Herma Mering for women  [facility]  [address] on the 1st floor Phone number [phone]   Tumeric capsules for knees or glucosamine.     PLEASE NOTE:  If you had any lab tests please let us know if you have not heard back within a few days. You may see your results on MyChart before we have a chance to review them but we will give you a call once they are reviewed by [place]. If we ordered any referrals today, please let us know if you have not heard from their office within the next week.   Please try these tips to maintain a healthy lifestyle:  Eat most of your calories during the day when you are active. Eliminate processed foods including packaged sweets (pies, cakes, cookies), reduce intake of potatoes, white bread, white pasta, and white rice. Look for whole grain options, oat flour or almond flour.  Each meal should contain half fruits/vegetables, one quarter protein, and one quarter carbs (no bigger than a computer mouse).  Cut down on sweet beverages. This includes juice, soda, and sweet tea. Also watch fruit intake, though this is a healthier sweet option, it still contains natural sugar! Limit to 3 servings daily.  Drink at least 1 glass of water with each meal and aim for at least 8 glasses per day  Exercise at least 150 minutes every week.

## 2023-05-09 NOTE — Assessment & Plan Note (Signed)
Chronic.  Controlled.  Continue lisinopril 10mg  daily.  Check bp's at least monthly

## 2023-05-09 NOTE — Progress Notes (Signed)
Normal/stable labs.  Same meds

## 2023-05-09 NOTE — Assessment & Plan Note (Signed)
Chronic.  Controlled.  Continue diet/exercise 

## 2023-05-10 LAB — HEPATITIS C ANTIBODY: Hepatitis C Ab: NONREACTIVE

## 2023-07-03 ENCOUNTER — Other Ambulatory Visit: Payer: Self-pay

## 2023-07-03 ENCOUNTER — Ambulatory Visit (INDEPENDENT_AMBULATORY_CARE_PROVIDER_SITE_OTHER): Payer: Medicare Other

## 2023-07-03 ENCOUNTER — Ambulatory Visit (INDEPENDENT_AMBULATORY_CARE_PROVIDER_SITE_OTHER): Payer: Medicare Other | Admitting: Family Medicine

## 2023-07-03 VITALS — BP 140/78 | HR 56 | Ht 60.5 in | Wt 136.0 lb

## 2023-07-03 DIAGNOSIS — M25561 Pain in right knee: Secondary | ICD-10-CM | POA: Diagnosis not present

## 2023-07-03 DIAGNOSIS — G8929 Other chronic pain: Secondary | ICD-10-CM

## 2023-07-03 NOTE — Progress Notes (Signed)
Rubin Payor, PhD, LAT, ATC acting as a scribe for Clementeen Graham, MD.  Holly Russell is a 72 y.o. female who presents to [facility] today for R knee pain. Pt was previously seen by Dr. Denyse Amass on 09/13/22 for trigger finger.  Today, pt c/o R knee pain that's chronic in nature, worsening over the last couple months. She notes of hx of repetitive falls, landing on her R knee, but not recently. Pt locates pain to the anterior-lateral aspect of her R knee. Pain at rest, when laying in bed. She does daily walks, about 3 miles.  R Knee swelling: can't determine Mechanical symptoms: no Aggravates: walking, down stairs, down a decline Treatments tried: compression sleeve  Pertinent review of systems: No fevers or chills  Relevant historical information: Hypertension   Exam:  BP (!) 140/78   Pulse (!) 56   Ht 5' 0.5" (1.537 m)   Wt 136 lb (61.7 kg)   SpO2 97%   BMI 26.12 kg/m  General: Well Developed, well nourished, and in no acute distress.   MSK: Right knee mild effusion.  Normal motion with crepitation.  Tender palpation medial and lateral joint line.    Lab and Radiology Results  Procedure: Real-time Ultrasound Guided Injection of right knee joint superior lateral patella space Device: Philips Affiniti 50G/GE Logiq Images permanently stored and available for review in PACS Verbal informed consent obtained.  Discussed risks and benefits of procedure. Warned about infection, bleeding, hyperglycemia damage to structures among others. Patient expresses understanding and agreement Time-out conducted.   Noted no overlying erythema, induration, or other signs of local infection.   Skin prepped in a sterile fashion.   Local anesthesia: Topical Ethyl chloride.   With sterile technique and under real time ultrasound guidance: 40 mg of Kenalog and 2 mL of Marcaine injected into knee joint. Fluid seen entering the joint capsule.   Completed without  difficulty   Pain immediately resolved suggesting accurate placement of the medication.   Advised to call if fevers/chills, erythema, induration, drainage, or persistent bleeding.   Images permanently stored and available for review in the ultrasound unit.  Impression: Technically successful ultrasound guided injection.   X-ray images right knee obtained today personally and independently interpreted Mild patellofemoral DJD.  No acute fractures are visible. Await formal radiology review    Assessment and Plan: 72 y.o. female with right knee pain.  Etiology is somewhat unclear.  This could be an exacerbation of DJD.  Plan for steroid injection.  Recommend Voltaren gel.  Recheck back as needed.   PDMP not reviewed this encounter. Orders Placed This Encounter  Procedures   [place] LIMITED JOINT SPACE STRUCTURES LOW RIGHT(NO LINKED CHARGES)    Order Specific Question:   Reason for Exam (SYMPTOM  OR DIAGNOSIS REQUIRED)    Answer:   right knee pain    Order Specific Question:   Preferred imaging location?    Answer:   [facility] Knee AP/LAT W/Sunrise Right    Standing Status:   Future    Number of Occurrences:   1    Standing Expiration Date:   08/03/2023    Order Specific Question:   Reason for Exam (SYMPTOM  OR DIAGNOSIS REQUIRED)    Answer:   right knee pain    Order Specific Question:   Preferred imaging location?    Answer:   [facility]   No orders of the defined types were  placed in this encounter.    Discussed warning signs or symptoms. Please see discharge instructions. Patient expresses understanding.   The above documentation has been reviewed and is accurate and complete Clementeen Graham, M.D.

## 2023-07-03 NOTE — Patient Instructions (Addendum)
Thank you for coming in today.   You received an injection today. Seek immediate medical attention if the joint becomes red, extremely painful, or is oozing fluid.  Please get an Xray today before you leave  Try Over-The-Counter Voltaren Gel.

## 2023-07-04 ENCOUNTER — Ambulatory Visit (INDEPENDENT_AMBULATORY_CARE_PROVIDER_SITE_OTHER): Payer: Medicare Other | Admitting: Internal Medicine

## 2023-07-04 ENCOUNTER — Telehealth: Payer: Self-pay | Admitting: Pharmacy Technician

## 2023-07-04 ENCOUNTER — Encounter: Payer: Self-pay | Admitting: Internal Medicine

## 2023-07-04 VITALS — BP 120/68 | HR 60 | Ht 60.5 in | Wt 134.0 lb

## 2023-07-04 DIAGNOSIS — M810 Age-related osteoporosis without current pathological fracture: Secondary | ICD-10-CM | POA: Diagnosis not present

## 2023-07-04 LAB — VITAMIN D 25 HYDROXY (VIT D DEFICIENCY, FRACTURES): VITD: 31.69 ng/mL (ref 30.00–100.00)

## 2023-07-04 LAB — PHOSPHORUS: Phosphorus: 3.9 mg/dL (ref 2.3–4.6)

## 2023-07-04 LAB — MAGNESIUM: Magnesium: 2.1 mg/dL (ref 1.5–2.5)

## 2023-07-04 LAB — ALBUMIN: Albumin: 4.2 g/dL (ref 3.5–5.2)

## 2023-07-04 NOTE — Progress Notes (Signed)
Name: Holly Russell  MRN/ DOB: [id number], [birth year]    Age/ Sex: 72 y.o., female    PCP: Jeani Sow, MD   Reason for Endocrinology Evaluation: Osteoporosis     Date of Initial Endocrinology Evaluation: 07/04/2023     HPI: Holly Russell is a 72 y.o. female with a past medical history of HTN and GERD, dyslipidemia. The patient presented for initial endocrinology clinic visit on 07/04/2023 for consultative assistance with her osteoporosis.   Pt was diagnosed with osteoporosis: 04/2022 with a T-score of -2.7 at the AP spine   Menarche at age : 43 Menopausal at age :  ~63 Fracture Hx: arm fracture as a kid , right foot fracture by missing a step  Hx of HRT: no FH of osteoporosis or hip fracture: mother with hip fracture  Prior Hx of anti-resorptive therapy : no    Calcium/Vit D- 600 mg 1-2x daily     She tries to consume calcium through food as well  She walks as a form of exercise   She has heartburn  She has occasional constipation  followed by diarrhea  She has right knee pain in addition to other shin pain in the past .   HISTORY:  Past Medical History:  Past Medical History:  Diagnosis Date   Acid reflux    Agatston CAC score 100-199    Basal cell carcinoma    Cataract    Coronary atherosclerosis    Deviated nasal septum    Eczema    Gastroesophageal reflux disease    H/O bone density study 2019-2015   H/O echocardiogram 2015   H/O exercise stress test 3/4-5 2015   Hearing loss    High blood pressure    High cholesterol    History of chest pain    History of prediabetes    Hx of mammogram 2018-2017-2016-2015-2014   Hyperlipidemia    IBS (irritable bowel syndrome)    Intervertebral disc disease    Iron deficiency anemia    On statin therapy    Osteoarthritis    Osteopenia    unspecified  Location   SNHL (sensorineural hearing loss)    Spinal stenosis    Tinnitus of left ear    Vitamin D deficiency    Past Surgical History:  Past  Surgical History:  Procedure Laterality Date   ENDOMETRIAL BIOPSY     EYE SURGERY     lazy eye when 72yo   MOHS SURGERY     for basal cell to the right of right eye   TONSILLECTOMY      Social History:  reports that she has never smoked. She has never used smokeless tobacco. She reports current alcohol use of about 3.0 standard drinks of alcohol per week. She reports that she does not use drugs. Family History: family history includes Basal cell carcinoma in her father and mother; Breast cancer in her cousin and paternal aunt; Cancer in her maternal grandfather and paternal aunt; Depression in her paternal grandmother; Heart attack in her paternal grandfather; Heart disease in her paternal grandfather; Hypertension in her father and mother; Stroke (age of onset: 67) in her mother; Varicose Veins in her father.   HOME MEDICATIONS: Allergies as of 07/04/2023   No Known Allergies      Medication List        Accurate as of July 04, 2023  1:20 PM. If you have any questions, ask your nurse or doctor.  STOP taking these medications    Iron 325 (65 Fe) MG Tabs Stopped by: Johnney Ou Porshe Fleagle   MAGNESIUM 27 PO Stopped by: Johnney Ou Roisin Mones   Potassium 99 MG Tabs Stopped by: Johnney Ou Lankford Gutzmer       TAKE these medications    aspirin EC 81 MG tablet Take 81 mg by mouth every Monday, Wednesday, and Friday. Swallow whole.   atorvastatin 40 MG tablet Commonly known as: LIPITOR Take 1 tablet (40 mg total) by mouth daily.   CALTRATE 600+D3 PO Take 600 mLs by mouth daily. 1-2 tablets daily   clobetasol ointment 0.05 % Commonly known as: TEMOVATE Apply topically as needed.   clotrimazole 1 % cream Commonly known as: LOTRIMIN as needed.   cyanocobalamin 1000 MCG tablet Commonly known as: VITAMIN B12 Take 1,000 mcg by mouth as needed.   famotidine 20 MG tablet Commonly known as: PEPCID Take 1 tablet (20 mg total) by mouth daily.   lisinopril 10 MG  tablet Commonly known as: ZESTRIL Take 1 tablet (10 mg total) by mouth daily.   Systane 0.4-0.3 % Soln Generic drug: Polyethyl Glycol-Propyl Glycol as needed.          REVIEW OF SYSTEMS: A comprehensive ROS was conducted with the patient and is negative except as per HPI    OBJECTIVE:  VS: BP 120/68 (BP Location: Left Arm, Patient Position: Sitting, Cuff Size: Small)   Pulse 60   Ht 5' 0.5" (1.537 m)   Wt 134 lb (60.8 kg)   SpO2 99%   BMI 25.74 kg/m    Wt Readings from Last 3 Encounters:  07/04/23 134 lb (60.8 kg)  07/03/23 136 lb (61.7 kg)  05/09/23 132 lb 6 oz (60 kg)     EXAM: General: Pt appears well and is in NAD  Neck: General: Supple without adenopathy. Thyroid: Thyroid size normal.  No goiter or nodules appreciated. No thyroid bruit.  Lungs: Clear with good BS bilat   Heart: Auscultation: RRR.  Abdomen: Soft, nontender  Extremities:  BL LE: No pretibial edema   Mental Status: Judgment, insight: Intact Orientation: Oriented to time, place, and person Mood and affect: No depression, anxiety, or agitation     DATA REVIEWED:     Latest Reference Range & Units 07/04/23 13:52  Phosphorus 2.3 - 4.6 mg/dL 3.9  Magnesium 1.5 - 2.5 mg/dL 2.1  Albumin 3.5 - 5.2 g/dL 4.2    Latest Reference Range & Units 07/04/23 13:52  VITD 30.00 - 100.00 ng/mL 31.69      Latest Reference Range & Units 05/09/23 09:56  Sodium 135 - 145 mEq/L 137  Potassium 3.5 - 5.1 mEq/L 4.2  Chloride 96 - 112 mEq/L 103  CO2 19 - 32 mEq/L 24  Glucose 70 - 99 mg/dL 91  BUN 6 - 23 mg/dL 21  Creatinine 0.34 - 7.42 mg/dL 5.95  Calcium 8.4 - 63.8 mg/dL 9.6  Alkaline Phosphatase 39 - 117 U/L 57  Albumin 3.5 - 5.2 g/dL 4.3  AST 0 - 37 U/L 17  ALT 0 - 35 U/L 16  Total Protein 6.0 - 8.3 g/dL 6.5  Total Bilirubin 0.2 - 1.2 mg/dL 0.9  GFR >75.64 mL/min 68.96   DXA 04/05/2022 The BMD measured at AP Spine L1-L4 is 0.857 g/cm2 with a T-score of -2.7. This patient is considered osteoporotic  according to World Health Organization [facility]) criteria.   The quality of the exam is good.   Site Region Measured Date Measured Age YA BMD  Significant CHANGE T-score AP Spine L1-L4 04/05/2022 72.2 -2.7 0.857 g/cm2   DualFemur Neck Left 04/05/2022 72.2 -2.0 0.767 g/cm2   DualFemur Total Mean 04/05/2022 72.2 -1.5 0.814 g/cm2   Right Forearm Radius 33% 04/05/2022 72.2 -1.6 0.737 g/cm2    ASSESSMENT/PLAN/RECOMMENDATIONS:   Osteoporosis:  -We discussed high risk of fracture risk -We emphasized the importance of optimizing calcium and vitamin D intake -We also emphasized the importance of weightbearing exercises -She had a normal TSH earlier in the year, and able to repeat today as this is not covered by insurance -Patient initially skeptical about that side effects of medications, but we discussed the importance of weighing the risk versus the benefit -We discussed rare side effect of atypical fractures and osteonecrosis. Pt encouraged to have an updated dental exam -Due to GERD patient would not be a candidate for oral bisphosphonate therapy and I have recommended zoledronic acid.  Cautioned against rash as well as flulike symptoms with first injection -We discussed considering a drug holiday at the 3-year mark of zoledronic acid -Labs today show normal magnesium, phosphorus, and vitamin D - PTH and calcium are pending  Medications : Zoledronic acid 5 mg IV annually  Follow-up in 6 months Signed electronically by: Lyndle Herrlich, MD  [facility]  [facility] [address] [place] [place], [place] [address] Phone: [phone] FAX: [phone]   CC: Jeani Sow, MD [address] [place] [address] Phone: [phone] Fax: [phone]   Return to Endocrinology clinic as below: Future Appointments  Date Time Provider Department Center  11/17/2023  9:00 AM Jeani Sow, MD [facility]  04/20/2024  3:30 PM [facility]  ANNUAL WELLNESS VISIT 1 [facility]

## 2023-07-04 NOTE — Patient Instructions (Addendum)
Calcium 1200 mg daily , whether you take this to food or tablets or combination of both Weightbearing exercises are encouraged to help with bone health   [facility] Address: [address] [place], [place] [address] Phone: [phone]

## 2023-07-04 NOTE — Telephone Encounter (Signed)
Auth Submission: NO AUTH NEEDED Site of care: Site of care: [facility] Payer: MEDICARE A/B & BCBS SUPP Medication & CPT/J Code(s) submitted: Reclast (Zolendronic acid) [id number] Route of submission (phone, fax, portal):  Phone # Fax # Auth type: Buy/Bill PB Units/visits requested: X1 Reference number:  Approval from: 07/04/23 to 11/04/23

## 2023-07-05 LAB — PTH, INTACT AND CALCIUM
Calcium: 9.6 mg/dL (ref 8.6–10.4)
PTH: 52 pg/mL (ref 16–77)

## 2023-07-14 NOTE — Progress Notes (Signed)
Right knee x-ray shows mild arthritis.

## 2023-08-06 ENCOUNTER — Ambulatory Visit: Payer: Medicare Other

## 2023-08-06 VITALS — BP 136/80 | HR 47 | Temp 98.0°F | Resp 12 | Ht 61.5 in | Wt 137.8 lb

## 2023-08-06 DIAGNOSIS — M810 Age-related osteoporosis without current pathological fracture: Secondary | ICD-10-CM | POA: Diagnosis not present

## 2023-08-06 MED ORDER — ACETAMINOPHEN 325 MG PO TABS
650.0000 mg | ORAL_TABLET | Freq: Once | ORAL | Status: AC
  Administered 2023-08-06: 650 mg via ORAL
  Filled 2023-08-06: qty 2

## 2023-08-06 MED ORDER — DIPHENHYDRAMINE HCL 25 MG PO CAPS
25.0000 mg | ORAL_CAPSULE | Freq: Once | ORAL | Status: AC
  Administered 2023-08-06: 25 mg via ORAL
  Filled 2023-08-06: qty 1

## 2023-08-06 MED ORDER — ZOLEDRONIC ACID 5 MG/100ML IV SOLN
5.0000 mg | Freq: Once | INTRAVENOUS | Status: AC
  Administered 2023-08-06: 5 mg via INTRAVENOUS
  Filled 2023-08-06: qty 100

## 2023-08-06 NOTE — Progress Notes (Signed)
Diagnosis: Osteoporosis  Provider:  Chilton Greathouse MD  Procedure: IV Infusion  IV Type: Peripheral, IV Location: R Hand  Reclast (Zolendronic Acid), Dose: 5 mg  Infusion Start Time: 1010  Infusion Stop Time: 1042  Post Infusion IV Care: Observation period completed and Peripheral IV Discontinued  Discharge: Condition: Stable, Destination: Home . AVS Provided  Performed by:  Wyvonne Lenz, RN

## 2023-08-07 ENCOUNTER — Telehealth: Payer: Self-pay | Admitting: *Deleted

## 2023-08-07 NOTE — Telephone Encounter (Signed)
Patient stated she is feeling okay. Patient scheduled for 1 pm with [name].

## 2023-08-07 NOTE — Telephone Encounter (Signed)
Patient called and stated that she had an infusion on yesterday and when they took her pulse it was 47, today she took it and it was 50. Patient stated she is concerned because it has never been that low and wanted to know what she should do. Please advise.

## 2023-08-08 ENCOUNTER — Ambulatory Visit (INDEPENDENT_AMBULATORY_CARE_PROVIDER_SITE_OTHER): Payer: Medicare Other | Admitting: Family

## 2023-08-08 ENCOUNTER — Encounter: Payer: Self-pay | Admitting: Family

## 2023-08-08 VITALS — BP 124/73 | HR 54 | Temp 98.0°F | Ht 61.5 in | Wt 135.2 lb

## 2023-08-08 DIAGNOSIS — M810 Age-related osteoporosis without current pathological fracture: Secondary | ICD-10-CM

## 2023-08-08 DIAGNOSIS — I10 Essential (primary) hypertension: Secondary | ICD-10-CM

## 2023-08-08 DIAGNOSIS — R001 Bradycardia, unspecified: Secondary | ICD-10-CM

## 2023-08-08 NOTE — Progress Notes (Signed)
Patient ID: Holly Russell, female    DOB: 1951-03-17, 72 y.o.   MRN: [id number]  Chief Complaint  Patient presents with   Irregular Heart Beat    Pt c/o low pulse 47 at the [facility] infusion center. Pt states he checked pulse at home and HR was 50.    *Discussed the use of AI scribe software for clinical note transcription with the patient, who gave verbal consent to proceed.  History of Present Illness   The patient, on lisinopril for blood pressure, presents with concerns about a low heart rate. She noticed a heart rate of 47 on her home blood pressure machine, which typically reads in the mid-50s to 60. She exercises regularly, walking three miles most days, and denies symptoms of bradycardia such as tiredness, dizziness, or lightheadedness. She had a period of feeling lightheaded a year or two ago, but denies recent episodes.  The patient also reports variable blood pressure readings. At home, her blood pressure is typically normal, but it was elevated to 160/80 at a recent infusion center visit, which she attributes to anxiety. After the procedure, her blood pressure was 130/80.  In addition, the patient has concerns about potential interactions between alcohol and Reclast, a medication she recently started. She typically consumes two to three alcoholic drinks per week, not on the same day.      Assessment & Plan:     Bradycardia - Heart rate occasionally dropping to 47, but typically in the mid-50s to 60. No associated symptoms of lightheadedness, dizziness, or fatigue. Regular rhythm noted on examination. -Monitor heart rate twice daily and report to PCP if consistently below 50 or if associated with symptoms. -Continue to exercise, drink 2L water daily, do not skip meals.  Hypertension - On Lisinopril. Blood pressure well controlled at the visit, but patient reports occasional high readings  during medical procedures. -Continue Lisinopril. -Check blood pressure daily and  report if consistently elevated.  Osteoporosis - On Reclast. Patient inquiring about potential interaction with alcohol. Unable to find information looking at Clinical Key and UpToDate medical resources. -Advise patient to send a portal message to the prescribing endocrinologist for clarification.   Subjective:    Outpatient Medications Prior to Visit  Medication Sig Dispense Refill   aspirin EC 81 MG tablet Take 81 mg by mouth every Monday, Wednesday, and Friday. Swallow whole.     atorvastatin (LIPITOR) 40 MG tablet Take 1 tablet (40 mg total) by mouth daily. 90 tablet 3   Calcium Carb-Cholecalciferol (CALTRATE 600+D3 PO) Take 600 mLs by mouth daily. 1-2 tablets daily     clobetasol ointment (TEMOVATE) 0.05 % Apply topically as needed.     clotrimazole (LOTRIMIN) 1 % cream as needed.     famotidine (PEPCID) 20 MG tablet Take 1 tablet (20 mg total) by mouth daily. 90 tablet 3   lisinopril (ZESTRIL) 10 MG tablet Take 1 tablet (10 mg total) by mouth daily. 90 tablet 3   Polyethyl Glycol-Propyl Glycol (SYSTANE) 0.4-0.3 % SOLN as needed.     vitamin B-12 (CYANOCOBALAMIN) 1000 MCG tablet Take 1,000 mcg by mouth as needed.     zoledronic acid (RECLAST) 5 MG/100ML SOLN injection      No facility-administered medications prior to visit.   Past Medical History:  Diagnosis Date   Acid reflux    Agatston CAC score 100-199    Basal cell carcinoma    Cataract    Coronary atherosclerosis    Deviated nasal septum  Eczema    Gastroesophageal reflux disease    H/O bone density study 2019-2015   H/O echocardiogram 2015   H/O exercise stress test 3/4-5 2015   Hearing loss    High blood pressure    High cholesterol    History of chest pain    History of prediabetes    Hx of mammogram 2018-2017-2016-2015-2014   Hyperlipidemia    IBS (irritable bowel syndrome)    Intervertebral disc disease    Iron deficiency anemia    On statin therapy    Osteoarthritis    Osteopenia    unspecified   Location   SNHL (sensorineural hearing loss)    Spinal stenosis    Tinnitus of left ear    Vitamin D deficiency    Past Surgical History:  Procedure Laterality Date   ENDOMETRIAL BIOPSY     EYE SURGERY     lazy eye when 72yo   MOHS SURGERY     for basal cell to the right of right eye   TONSILLECTOMY     No Known Allergies    Objective:    Physical Exam Vitals and nursing note reviewed.  Constitutional:      Appearance: Normal appearance.  Cardiovascular:     Rate and Rhythm: Normal rate and regular rhythm.  Pulmonary:     Effort: Pulmonary effort is normal.     Breath sounds: Normal breath sounds.  Musculoskeletal:        General: Normal range of motion.  Skin:    General: Skin is warm and dry.  Neurological:     Mental Status: She is alert.  Psychiatric:        Mood and Affect: Mood normal.        Behavior: Behavior normal.    BP 124/73 (BP Location: Left Arm, Patient Position: Sitting, Cuff Size: Normal)   Pulse (!) 54   Temp 98 F (36.7 C) (Temporal)   Ht 5' 1.5" (1.562 m)   Wt 135 lb 3.2 oz (61.3 kg)   SpO2 99%   BMI 25.13 kg/m  Wt Readings from Last 3 Encounters:  08/08/23 135 lb 3.2 oz (61.3 kg)  08/06/23 137 lb 12.8 oz (62.5 kg)  07/04/23 134 lb (60.8 kg)       [name], NP

## 2023-08-21 ENCOUNTER — Encounter: Payer: Self-pay | Admitting: Family Medicine

## 2023-09-10 ENCOUNTER — Ambulatory Visit (INDEPENDENT_AMBULATORY_CARE_PROVIDER_SITE_OTHER): Payer: Medicare Other | Admitting: Audiology

## 2023-09-10 ENCOUNTER — Encounter (INDEPENDENT_AMBULATORY_CARE_PROVIDER_SITE_OTHER): Payer: Self-pay

## 2023-09-10 ENCOUNTER — Ambulatory Visit (INDEPENDENT_AMBULATORY_CARE_PROVIDER_SITE_OTHER): Payer: Medicare Other | Admitting: Otolaryngology

## 2023-09-10 VITALS — Ht 61.0 in | Wt 133.0 lb

## 2023-09-10 DIAGNOSIS — H903 Sensorineural hearing loss, bilateral: Secondary | ICD-10-CM | POA: Diagnosis not present

## 2023-09-10 NOTE — Progress Notes (Signed)
  [address], [address] [place], [place] [address] [phone]  Audiological Evaluation    Name: Holly Russell     DOB:   [birth year]      MRN:   [id number]                                                                                     Service Date: 09/10/2023        Patient was referred today for a hearing evaluation by Dr. Karle Barr.   Symptoms Yes Details  Hearing loss  [x]  Patient reported increased difficulty understanding words.  Tinnitus  [x]  Patient reported experiencing tinnitus.  Balance problems  []  Patient denied vertigo/imbalance sensations.  Previous ear surgeries  []  Patient denied any previous ear surgeries.  Amplification  [x]  Patient reported the use of hearing aids bilaterally.    Tympanogram: Right ear: Normal external ear canal volume with normal middle ear pressure and tympanic membrane compliance (Type A). Left ear: Could not obtain seal; middle ear status is unable to be determined at this time.    Hearing Evaluation: The audiogram was completed using conventional audiometric techniques under headphones with good reliability.   The hearing test results indicate: Right ear: Moderately-severe sensorineural hearing loss from 8730712048 Hz. Left ear: Moderate sensorineural hearing loss from 8450334743 Hz sloping to severe sensorineural hearing loss from 2000-8000 Hz.  Speech Audiometry: Right ear- Speech Reception Threshold (SRT) was obtained at 65 dBHL masked. Left ear- Speech Reception Threshold (SRT) was obtained at 50 dBHL.   Word Recognition Score Tested using NU-6 (MLV) Right ear: 76% was obtained at a presentation level of 85 dBHL with contralateral masking which is deemed as fair understanding. Left ear: 84% was obtained at a presentation level of 80 dBHL which is deemed as good understanding.    Recommendations: Repeat audiogram when changes are perceived or per MD. Continue use of hearing aids.   Holly Russell, AUD,  CCC-A 09/10/23

## 2023-09-13 DIAGNOSIS — H903 Sensorineural hearing loss, bilateral: Secondary | ICD-10-CM | POA: Insufficient documentation

## 2023-09-13 NOTE — Progress Notes (Signed)
Patient ID: Holly Russell, female   DOB: Aug 10, 1951, 72 y.o.   MRN: [id number]  Follow-up: Progressive hearing loss  HPI: The patient is a 72 year old female who presents today for her follow-up evaluation.  The patient was previously seen for bilateral progressive hearing loss.  She was previously evaluated by ENT physicians in [facility]. [facility]).  According to the patient, her MRI scan was negative.  She has a family history of hereditary hearing loss.  The patient returns today complaining of persistent hearing difficulty.  She uses bilateral hearing aids.  She is having difficulty hearing in noisy environments.  Currently she denies any otalgia, otorrhea, or vertigo.  Exam: General: Communicates without difficulty, well nourished, no acute distress. Head: Normocephalic, no evidence injury, no tenderness, facial buttresses intact without stepoff. Face/sinus: No tenderness to palpation and percussion. Facial movement is normal and symmetric. Eyes: PERRL, EOMI. No scleral icterus, conjunctivae clear. Neuro: CN II exam reveals vision grossly intact.  No nystagmus at any point of gaze. Ears: Auricles well formed without lesions.  Ear canals are intact without mass or lesion.  No erythema or edema is appreciated.  The TMs are intact without fluid. Nose: External evaluation reveals normal support and skin without lesions.  Dorsum is intact.  Anterior rhinoscopy reveals pink mucosa over anterior aspect of inferior turbinates and intact septum.  No purulence noted. Oral:  Oral cavity and oropharynx are intact, symmetric, without erythema or edema.  Mucosa is moist without lesions. Neck: Full range of motion without pain.  There is no significant lymphadenopathy.  No masses palpable.  Thyroid bed within normal limits to palpation.  Parotid glands and submandibular glands equal bilaterally without mass.  Trachea is midline. Neuro:  CN 2-12 grossly intact. Gait normal.   AUDIOMETRIC  TESTING: I have read and reviewed the audiometric test, which shows bilateral moderate sensorineural hearing loss, slightly worse on the right side at low frequencies.  Assessment: 1.  Bilateral moderate sensorineural hearing loss, slightly worse on the right at low frequencies. 2.  The patient has a family history of hereditary hearing loss. 3.  She was previously evaluated at the [facility]. [facility] in [place].  According to the patient, her MRI scan was negative.  Plan  1.  The physical exam findings and the hearing test results are reviewed with the patient. 2.  Continue the use of her hearing aids. 3.  The patient will return for reevaluation in 1 year.

## 2023-11-03 ENCOUNTER — Encounter: Payer: Self-pay | Admitting: Obstetrics and Gynecology

## 2023-11-03 ENCOUNTER — Other Ambulatory Visit (HOSPITAL_COMMUNITY)
Admission: RE | Admit: 2023-11-03 | Discharge: 2023-11-03 | Disposition: A | Discharge status: Home / Self Care | Payer: Medicare Other | Source: Ambulatory Visit | Attending: Obstetrics and Gynecology | Admitting: Obstetrics and Gynecology

## 2023-11-03 ENCOUNTER — Ambulatory Visit (INDEPENDENT_AMBULATORY_CARE_PROVIDER_SITE_OTHER): Payer: Medicare Other | Admitting: Obstetrics and Gynecology

## 2023-11-03 VITALS — BP 124/64 | HR 58 | Ht 60.5 in | Wt 138.0 lb

## 2023-11-03 DIAGNOSIS — Z01419 Encounter for gynecological examination (general) (routine) without abnormal findings: Secondary | ICD-10-CM | POA: Diagnosis not present

## 2023-11-03 DIAGNOSIS — Z124 Encounter for screening for malignant neoplasm of cervix: Secondary | ICD-10-CM

## 2023-11-03 DIAGNOSIS — Z1211 Encounter for screening for malignant neoplasm of colon: Secondary | ICD-10-CM

## 2023-11-03 DIAGNOSIS — R32 Unspecified urinary incontinence: Secondary | ICD-10-CM | POA: Diagnosis not present

## 2023-11-03 NOTE — Assessment & Plan Note (Signed)
Cervical cancer screening performed according to ASCCP guidelines. Encouraged annual mammogram screening Colonoscopy due, referral placed to GI [facility]XA up-to-date Labs and immunizations with her primary Encouraged safe sexual practices as indicated Encouraged healthy lifestyle practices with diet and exercise For patients over 73yo, I recommend 1200mg  calcium daily and 800IU of vitamin D daily.

## 2023-11-03 NOTE — Patient Instructions (Signed)
For patients under 50-73yo, I recommend 1200mg  calcium daily and 600IU of vitamin D daily. For patients over 73yo, I recommend 1200mg  calcium daily and 800IU of vitamin D daily.  Health Maintenance, Female Adopting a healthy lifestyle and getting preventive care are important in promoting health and wellness. Ask your health care provider about: The right schedule for you to have regular tests and exams. Things you can do on your own to prevent diseases and keep yourself healthy. What should I know about diet, weight, and exercise? Eat a healthy diet  Eat a diet that includes plenty of vegetables, fruits, low-fat dairy products, and lean protein. Do not eat a lot of foods that are high in solid fats, added sugars, or sodium. Maintain a healthy weight Body mass index (BMI) is used to identify weight problems. It estimates body fat based on height and weight. Your health care provider can help determine your BMI and help you achieve or maintain a healthy weight. Get regular exercise Get regular exercise. This is one of the most important things you can do for your health. Most adults should: Exercise for at least 150 minutes each week. The exercise should increase your heart rate and make you sweat (moderate-intensity exercise). Do strengthening exercises at least twice a week. This is in addition to the moderate-intensity exercise. Spend less time sitting. Even light physical activity can be beneficial. Watch cholesterol and blood lipids Have your blood tested for lipids and cholesterol at 73 years of age, then have this test every 5 years. Have your cholesterol levels checked more often if: Your lipid or cholesterol levels are high. You are older than 73 years of age. You are at high risk for heart disease. What should I know about cancer screening? Depending on your health history and family history, you may need to have cancer screening at various ages. This may include screening  for: Breast cancer. Cervical cancer. Colorectal cancer. Skin cancer. Lung cancer. What should I know about heart disease, diabetes, and high blood pressure? Blood pressure and heart disease High blood pressure causes heart disease and increases the risk of stroke. This is more likely to develop in people who have high blood pressure readings or are overweight. Have your blood pressure checked: Every 3-5 years if you are 83-73 years of age. Every year if you are 4 years old or older. Diabetes Have regular diabetes screenings. This checks your fasting blood sugar level. Have the screening done: Once every three years after age 68 if you are at a normal weight and have a low risk for diabetes. More often and at a younger age if you are overweight or have a high risk for diabetes. What should I know about preventing infection? Hepatitis B If you have a higher risk for hepatitis B, you should be screened for this virus. Talk with your health care provider to find out if you are at risk for hepatitis B infection. Hepatitis C Testing is recommended for: Everyone born from [birth year] through 1966. Anyone with known risk factors for hepatitis C. Sexually transmitted infections (STIs) Get screened for STIs, including gonorrhea and chlamydia, if: You are sexually active and are younger than 73 years of age. You are older than 73 years of age and your health care provider tells you that you are at risk for this type of infection. Your sexual activity has changed since you were last screened, and you are at increased risk for chlamydia or gonorrhea. Ask your health care provider if  you are at risk. Ask your health care provider about whether you are at high risk for HIV. Your health care provider may recommend a prescription medicine to help prevent HIV infection. If you choose to take medicine to prevent HIV, you should first get tested for HIV. You should then be tested every 3 months for as long as you  are taking the medicine. Osteoporosis and menopause Osteoporosis is a disease in which the bones lose minerals and strength with aging. This can result in bone fractures. If you are 44 years old or older, or if you are at risk for osteoporosis and fractures, ask your health care provider if you should: Be screened for bone loss. Take a calcium or vitamin D supplement to lower your risk of fractures. Be given hormone replacement therapy (HRT) to treat symptoms of menopause. Follow these instructions at home: Alcohol use Do not drink alcohol if: Your health care provider tells you not to drink. You are pregnant, may be pregnant, or are planning to become pregnant. If you drink alcohol: Limit how much you have to: 0-1 drink a day. Know how much alcohol is in your drink. In the [place] one drink equals one 12 oz bottle of beer (355 mL), one 5 oz glass of wine (148 mL), or one 1 oz glass of hard liquor (44 mL). Lifestyle Do not use any products that contain nicotine or tobacco. These products include cigarettes, chewing tobacco, and vaping devices, such as e-cigarettes. If you need help quitting, ask your health care provider. Do not use street drugs. Do not share needles. Ask your health care provider for help if you need support or information about quitting drugs. General instructions Schedule regular health, dental, and eye exams. Stay current with your vaccines. Tell your health care provider if: You often feel depressed. You have ever been abused or do not feel safe at home. Summary Adopting a healthy lifestyle and getting preventive care are important in promoting health and wellness. Follow your health care provider's instructions about healthy diet, exercising, and getting tested or screened for diseases. Follow your health care provider's instructions on monitoring your cholesterol and blood pressure. This information is not intended to replace advice given to you by your health  care provider. Make sure you discuss any questions you have with your health care provider. Document Revised: 03/12/2021 Document Reviewed: 03/12/2021 Elsevier Patient Education  2024 [employer].

## 2023-11-03 NOTE — Progress Notes (Signed)
72 y.o. [id number] postmenopausal female with osteoporosis (dx 2023, followed by endocrine, on reclast), microscopic hematuria followed by urology here for annual exam. Married.  Pt c/o urinary incontinence and frequency, some irration at times. Pt states occasional vaginal itching. Drinks approximately 48 ounces of water a day, 24 ounces of half calf coffee, 8 ounces of black tea at night. Urinates every 2 hours during the day and once at night. The predominant symptom is urinary urgency but she does have occasional incontinence that bothers her.  Denies stress urinary incontinence. Seeing urology February 2025.  Postmenopausal bleeding: None Pelvic discharge or pain: None Breast mass, nipple discharge or skin changes : None Last PAP:     Component Value Date/Time   DIAGPAP  10/17/2020 1034    - Negative for intraepithelial lesion or malignancy (NILM)   HPVHIGH Negative 10/17/2020 1034   ADEQPAP  10/17/2020 1034    Satisfactory for evaluation. The presence or absence of an   ADEQPAP  10/17/2020 1034    endocervical/transformation zone component cannot be determined because   ADEQPAP of atrophy. 10/17/2020 1034   Last mammogram: 01/14/2023 BIRADS 1, density b Last colonoscopy: 01/02/2013  Last DXA: 04/05/2022  Sexually active: no  Exercising: yes, walking Smoker: no  GYN HISTORY: No significant hx  OB History  Gravida Para Term Preterm AB Living  2 2 2   2   SAB IAB Ectopic Multiple Live Births      2    # Outcome Date GA Lbr Len/2nd Weight Sex Type Anes PTL Lv  2 Term           1 Term             Past Medical History:  Diagnosis Date   Acid reflux    Agatston CAC score 100-199    Basal cell carcinoma    Cataract    Coronary atherosclerosis    Deviated nasal septum    Eczema    Gastroesophageal reflux disease    H/O bone density study 2019-2015   H/O echocardiogram 2015   H/O exercise stress test 3/4-5 2015   Hearing loss    High blood pressure    High  cholesterol    History of chest pain    History of prediabetes    Hx of mammogram 2018-2017-2016-2015-2014   Hyperlipidemia    IBS (irritable bowel syndrome)    Intervertebral disc disease    Iron deficiency anemia    On statin therapy    Osteoarthritis    Osteopenia    unspecified  Location   SNHL (sensorineural hearing loss)    Spinal stenosis    Tinnitus of left ear    Vitamin D deficiency     Past Surgical History:  Procedure Laterality Date   ENDOMETRIAL BIOPSY     EYE SURGERY     lazy eye when 72yo   MOHS SURGERY     for basal cell to the right of right eye   TONSILLECTOMY      Current Outpatient Medications on File Prior to Visit  Medication Sig Dispense Refill   aspirin EC 81 MG tablet Take 81 mg by mouth every Monday, Wednesday, and Friday. Swallow whole.     atorvastatin (LIPITOR) 40 MG tablet Take 1 tablet (40 mg total) by mouth daily. 90 tablet 3   Calcium Carb-Cholecalciferol (CALTRATE 600+D3 PO) Take 600 mLs by mouth daily. 1-2 tablets daily     famotidine (PEPCID) 20 MG tablet Take 1 tablet (20  mg total) by mouth daily. 90 tablet 3   lisinopril (ZESTRIL) 10 MG tablet Take 1 tablet (10 mg total) by mouth daily. 90 tablet 3   vitamin B-12 (CYANOCOBALAMIN) 1000 MCG tablet Take 1,000 mcg by mouth as needed.     zoledronic acid (RECLAST) 5 MG/100ML SOLN injection      No current facility-administered medications on file prior to visit.    Social History   Socioeconomic History   Marital status: Married    Spouse name: Not on file   Number of children: 2   Years of education: Not on file   Highest education level: Professional school degree (e.g., MD, DDS, DVM, JD)  Occupational History   Not on file  Tobacco Use   Smoking status: Never   Smokeless tobacco: Never  Vaping Use   Vaping status: Never Used  Substance and Sexual Activity   Alcohol use: Yes    Alcohol/week: 3.0 standard drinks of alcohol    Types: 3 Glasses of wine per week    Comment:  3-4   Drug use: Never   Sexual activity: Not Currently    Partners: Male    Birth control/protection: Post-menopausal  Other Topics Concern   Not on file  Social History Narrative   Diet: Plant Based and Fish      Do you drink/ eat things with caffeine?  Yes      Marital status:  Married       2 children, 1 grand            What year were you married ? 1976      Do you live in a house, apartment,assistred living, condo, trailer, etc.)? Condo-Town House      Is it one or more stories? 2      How many persons live in your home ? 2      Do you have any pets in your home ?(please list) No      Highest Level of education completed: Law School      Current or past profession: Attorney      Do you exercise?     Yes                         Type & how often Walk-3 miles daily      ADVANCED DIRECTIVES (Please bring copies)      Do you have a living will? Yes      Do you have a DNR form?   Yes                    If not, do you want to discuss one?       Do you have signed POA?HPOA forms?  Yes               If so, please bring to your appointment      FUNCTIONAL STATUS- To be completed by Spouse / child / Staff       Do you have difficulty bathing or dressing yourself ?  No      Do you have difficulty preparing food or eating ?  No      Do you have difficulty managing your mediation ?  No      Do you have difficulty managing your finances ?  No      Do you have difficulty affording your medication ?  No      Social Drivers of  Health   Financial Resource Strain: Low Risk  (05/05/2023)   Overall Financial Resource Strain (CARDIA)    Difficulty of Paying Living Expenses: Not hard at all  Food Insecurity: Low Risk  (06/24/2023)   Received from [facility]   Hunger Vital Sign    Worried About Running Out of Food in the Last Year: Never true    Ran Out of Food in the Last Year: Never true  Transportation Needs: No Transportation Needs (06/24/2023)   Received from [employer]    In the past 12 months, has lack of reliable transportation kept you from medical appointments, meetings, work or from getting things needed for daily living? : No  Physical Activity: Sufficiently Active (05/05/2023)   Exercise Vital Sign    Days of Exercise per Week: 6 days    Minutes of Exercise per Session: 60 min  Stress: No Stress Concern Present (05/05/2023)   [employer] of Occupational Health - Occupational Stress Questionnaire    Feeling of Stress : Only a little  Social Connections: Moderately Integrated (05/05/2023)   Social Connection and Isolation Panel [NHANES]    Frequency of Communication with Friends and Family: Twice a week    Frequency of Social Gatherings with Friends and Family: Once a week    Attends Religious Services: Never    Database administrator or Organizations: Yes    Attends Engineer, structural: More than 4 times per year    Marital Status: Married  Catering manager Violence: Not At Risk (04/15/2023)   Humiliation, Afraid, Rape, and Kick questionnaire    Fear of Current or Ex-Partner: No    Emotionally Abused: No    Physically Abused: No    Sexually Abused: No    Family History  Problem Relation Age of Onset   Stroke Mother 58   Basal cell carcinoma Mother    Hypertension Mother    Basal cell carcinoma Father    Hypertension Father    Varicose Veins Father    Breast cancer Paternal Aunt        69s   Heart attack Paternal Grandfather    Heart disease Paternal Grandfather    Breast cancer Cousin        30s-40s   Cancer Maternal Grandfather    Depression Paternal Grandmother    Cancer Paternal Aunt     No Known Allergies   PE Today's Vitals   11/03/23 1455  BP: 124/64  Pulse: (!) 58  SpO2: 99%  Weight: 138 lb (62.6 kg)  Height: 5' 0.5" (1.537 m)   Body mass index is 26.51 kg/m.  Physical Exam Vitals reviewed. Exam conducted with a chaperone present.  Constitutional:      General: She is not in acute  distress.    Appearance: Normal appearance.  HENT:     Head: Normocephalic and atraumatic.     Nose: Nose normal.  Eyes:     Extraocular Movements: Extraocular movements intact.     Conjunctiva/sclera: Conjunctivae normal.  Neck:     Thyroid: No thyroid mass, thyromegaly or thyroid tenderness.  Pulmonary:     Effort: Pulmonary effort is normal.  Chest:     Chest wall: No mass or tenderness.  Breasts:    Right: Normal. No swelling, mass, nipple discharge, skin change or tenderness.     Left: Normal. No swelling, mass, nipple discharge, skin change or tenderness.  Abdominal:     General: There is no distension.  Palpations: Abdomen is soft.     Tenderness: There is no abdominal tenderness.  Genitourinary:    General: Normal vulva.     Exam position: Lithotomy position.     Urethra: No prolapse.     Vagina: Normal. No vaginal discharge or bleeding.     Cervix: Normal. No lesion.     Uterus: Normal. Not enlarged and not tender.      Adnexa: Right adnexa normal and left adnexa normal.  Musculoskeletal:        General: Normal range of motion.     Cervical back: Normal range of motion.  Lymphadenopathy:     Upper Body:     Right upper body: No axillary adenopathy.     Left upper body: No axillary adenopathy.     Lower Body: No right inguinal adenopathy. No left inguinal adenopathy.  Skin:    General: Skin is warm and dry.  Neurological:     General: No focal deficit present.     Mental Status: She is alert.  Psychiatric:        Mood and Affect: Mood normal.        Behavior: Behavior normal.      Assessment and Plan:        Well woman exam with routine gynecological exam Assessment & Plan: Cervical cancer screening performed according to ASCCP guidelines. Encouraged annual mammogram screening Colonoscopy due, referral placed to GI DXA up-to-date Labs and immunizations with her primary Encouraged safe sexual practices as indicated Encouraged healthy lifestyle  practices with diet and exercise For patients over 70yo, I recommend 1200mg  calcium daily and 800IU of vitamin D daily.    Urinary incontinence, unspecified type Assessment & Plan: Discussed dietary modifications (including limiting caffeinated beverages), pelvic floor physical therapy, and medical therapy.  Patient is interested in pelvic floor PT. referral placed  Orders: -     Urinalysis,Complete w/RFL Culture -     Ambulatory referral to Physical Therapy  Cervical cancer screening -     Cytology - PAP  Colon cancer screening -     Ambulatory referral to Gastroenterology  Other orders -     Urine Culture -     REFLEXIVE URINE CULTURE    Liahna Brickner Lasandra Beech, MD

## 2023-11-03 NOTE — Assessment & Plan Note (Signed)
Discussed dietary modifications (including limiting caffeinated beverages), pelvic floor physical therapy, and medical therapy.  Patient is interested in pelvic floor PT. referral placed

## 2023-11-05 LAB — URINALYSIS, COMPLETE W/RFL CULTURE
Bacteria, UA: NONE SEEN /[HPF]
Bilirubin Urine: NEGATIVE
Casts: NONE SEEN /LPF
Crystals: NONE SEEN /HPF
Glucose, UA: NEGATIVE
Ketones, ur: NEGATIVE
Nitrites, Initial: NEGATIVE
Protein, ur: NEGATIVE
Specific Gravity, Urine: 1.01 (ref 1.001–1.035)
Yeast: NONE SEEN /[HPF]
pH: 6.5 (ref 5.0–8.0)

## 2023-11-05 LAB — URINE CULTURE
MICRO NUMBER:: 15899914
Result:: NO GROWTH
SPECIMEN QUALITY:: ADEQUATE

## 2023-11-05 LAB — CULTURE INDICATED

## 2023-11-07 LAB — CYTOLOGY - PAP: Diagnosis: NEGATIVE

## 2023-11-17 ENCOUNTER — Ambulatory Visit (INDEPENDENT_AMBULATORY_CARE_PROVIDER_SITE_OTHER): Payer: Medicare Other | Admitting: Family Medicine

## 2023-11-17 ENCOUNTER — Encounter: Payer: Self-pay | Admitting: Family Medicine

## 2023-11-17 VITALS — BP 136/62 | HR 60 | Temp 97.2°F | Ht 60.5 in | Wt 138.4 lb

## 2023-11-17 DIAGNOSIS — R7303 Prediabetes: Secondary | ICD-10-CM

## 2023-11-17 DIAGNOSIS — K2100 Gastro-esophageal reflux disease with esophagitis, without bleeding: Secondary | ICD-10-CM | POA: Diagnosis not present

## 2023-11-17 DIAGNOSIS — I10 Essential (primary) hypertension: Secondary | ICD-10-CM | POA: Diagnosis not present

## 2023-11-17 DIAGNOSIS — E538 Deficiency of other specified B group vitamins: Secondary | ICD-10-CM

## 2023-11-17 DIAGNOSIS — R918 Other nonspecific abnormal finding of lung field: Secondary | ICD-10-CM

## 2023-11-17 DIAGNOSIS — M810 Age-related osteoporosis without current pathological fracture: Secondary | ICD-10-CM

## 2023-11-17 DIAGNOSIS — K219 Gastro-esophageal reflux disease without esophagitis: Secondary | ICD-10-CM

## 2023-11-17 DIAGNOSIS — E7800 Pure hypercholesterolemia, unspecified: Secondary | ICD-10-CM

## 2023-11-17 LAB — COMPREHENSIVE METABOLIC PANEL
ALT: 21 U/L (ref 0–35)
AST: 17 U/L (ref 0–37)
Albumin: 4.2 g/dL (ref 3.5–5.2)
Alkaline Phosphatase: 48 U/L (ref 39–117)
BUN: 16 mg/dL (ref 6–23)
CO2: 26 meq/L (ref 19–32)
Calcium: 9.1 mg/dL (ref 8.4–10.5)
Chloride: 107 meq/L (ref 96–112)
Creatinine, Ser: 0.75 mg/dL (ref 0.40–1.20)
GFR: 78.72 mL/min (ref >60.00)
Glucose, Bld: 90 mg/dL (ref 70–99)
Potassium: 4.3 meq/L (ref 3.5–5.1)
Sodium: 140 meq/L (ref 135–145)
Total Bilirubin: 0.6 mg/dL (ref 0.2–1.2)
Total Protein: 6.8 g/dL (ref 6.0–8.3)

## 2023-11-17 LAB — CBC WITH DIFFERENTIAL/PLATELET
Basophils Absolute: 0 10*3/uL (ref 0.0–0.1)
Basophils Relative: 0.4 % (ref 0.0–3.0)
Eosinophils Absolute: 0.1 10*3/uL (ref 0.0–0.7)
Eosinophils Relative: 1.9 % (ref 0.0–5.0)
HCT: 40.2 % (ref 36.0–46.0)
Hemoglobin: 13.4 g/dL (ref 12.0–15.0)
Lymphocytes Relative: 23.3 % (ref 12.0–46.0)
Lymphs Abs: 1.2 10*3/uL (ref 0.7–4.0)
MCHC: 33.4 g/dL (ref 30.0–36.0)
MCV: 90.9 fL (ref 78.0–100.0)
Monocytes Absolute: 0.4 10*3/uL (ref 0.1–1.0)
Monocytes Relative: 8 % (ref 3.0–12.0)
Neutro Abs: 3.4 10*3/uL (ref 1.4–7.7)
Neutrophils Relative %: 66.4 % (ref 43.0–77.0)
Platelets: 217 10*3/uL (ref 150.0–400.0)
RBC: 4.42 Mil/uL (ref 3.87–5.11)
RDW: 14.7 % (ref 11.5–15.5)
WBC: 5 10*3/uL (ref 4.0–10.5)

## 2023-11-17 LAB — LIPID PANEL
Cholesterol: 277 mg/dL — ABNORMAL HIGH (ref 0–200)
HDL: 141.1 mg/dL (ref >39.00)
LDL Cholesterol: 127 mg/dL — ABNORMAL HIGH (ref 0–99)
NonHDL: 135.5
Total CHOL/HDL Ratio: 2
Triglycerides: 44 mg/dL (ref 0.0–149.0)
VLDL: 8.8 mg/dL (ref 0.0–40.0)

## 2023-11-17 LAB — VITAMIN B12: Vitamin B-12: 446 pg/mL (ref 211–911)

## 2023-11-17 LAB — HEMOGLOBIN A1C: Hgb A1c MFr Bld: 5.6 % (ref 4.6–6.5)

## 2023-11-17 MED ORDER — FAMOTIDINE 20 MG PO TABS: 20.0000 mg | ORAL_TABLET | Freq: Every day | ORAL | 3 refills | Status: DC

## 2023-11-17 NOTE — Assessment & Plan Note (Signed)
 Chronic. Stable.  Check CT for f/u.

## 2023-11-17 NOTE — Assessment & Plan Note (Signed)
 Chronic.  Better on pepcid daily.  Will d/w GI if EGD needed

## 2023-11-17 NOTE — Assessment & Plan Note (Signed)
 Chronic. Intermitt supps.  Try reminders

## 2023-11-17 NOTE — Assessment & Plan Note (Signed)
Chronic.  Not well controlled but ratios ok.  Continue atorvastatin 40mg  daily

## 2023-11-17 NOTE — Patient Instructions (Addendum)
 It was very nice to see you today!  Do weekly reminder for blood pressure.    PLEASE NOTE:  If you had any lab tests please let us  know if you have not heard back within a few days. You may see your results on MyChart before we have a chance to review them but we will give you a call once they are reviewed by us . If we ordered any referrals today, please let us  know if you have not heard from their office within the next week.   Please try these tips to maintain a healthy lifestyle:  Eat most of your calories during the day when you are active. Eliminate processed foods including packaged sweets (pies, cakes, cookies), reduce intake of potatoes, white bread, white pasta, and white rice. Look for whole grain options, oat flour or almond flour.  Each meal should contain half fruits/vegetables, one quarter protein, and one quarter carbs (no bigger than a computer mouse).  Cut down on sweet beverages. This includes juice, soda, and sweet tea. Also watch fruit intake, though this is a healthier sweet option, it still contains natural sugar! Limit to 3 servings daily.  Drink at least 1 glass of water with each meal and aim for at least 8 glasses per day  Exercise at least 150 minutes every week.

## 2023-11-17 NOTE — Assessment & Plan Note (Signed)
 Chronic.  Exercises.  Takes calcium/D.  On reclast w/endo

## 2023-11-17 NOTE — Assessment & Plan Note (Signed)
 Chronic.  Controlled.  Continue diet/exercise

## 2023-11-17 NOTE — Progress Notes (Signed)
 Subjective:     Patient ID: Holly Russell, female    DOB: 06/20/1951, 73 y.o.   MRN: [id number]  Chief Complaint  Patient presents with   Follow-up    Pt here for f/u on BP   Hypertension   Hyperlipidemia   Anemia   Arthritis    HPI  HTN-Pt is on lisinopril  10mg   Bp's running not checking regularly.  Can be high at times, but normal others.   No ha/dizziness/cp/palp/edema/cough/sob.   ASA 3x/wk  HLD-on atorvastatin  40mg .  No SE. B12 deficiency-pescitarian.  intermitt supps PreDM-working on diet/exercise.-walks 3 miles most days Osteoporosis-taking calcium /D-struggling w/constipation-active.on reclast  w/endo Eczema-clobetasol works  Lung nodules - per rad f/u 2 yrs. Due May 2025   GERD-not occurring much any more-working on dietary changes.  Presents w/CP and water resolves. Saw Card in past.   Will contact GI for cscope  Health Maintenance Due  Topic Date Due   COVID-19 Vaccine (10 - 2024-25 season) 08/31/2023    Past Medical History:  Diagnosis Date   Acid reflux    Agatston CAC score 100-199    Basal cell carcinoma    Cataract    Coronary atherosclerosis    Deviated nasal septum    Eczema    Gastroesophageal reflux disease    H/O bone density study 2019-2015   H/O echocardiogram 2015   H/O exercise stress test 3/4-5 2015   Hearing loss    High blood pressure    High cholesterol    History of chest pain    History of prediabetes    Hx of mammogram 2018-2017-2016-2015-2014   Hyperlipidemia    IBS (irritable bowel syndrome)    Intervertebral disc disease    Iron deficiency anemia    On statin therapy    Osteoarthritis    Osteopenia    unspecified  Location   SNHL (sensorineural hearing loss)    Spinal stenosis    Tinnitus of left ear    Vitamin D  deficiency     Past Surgical History:  Procedure Laterality Date   ENDOMETRIAL BIOPSY     EYE SURGERY     lazy eye when 73yo   MOHS SURGERY     for basal cell to the right of right eye   TONSILLECTOMY        Current Outpatient Medications:    aspirin EC 81 MG tablet, Take 81 mg by mouth every Monday, Wednesday, and Friday. Swallow whole., Disp: , Rfl:    atorvastatin  (LIPITOR) 40 MG tablet, Take 1 tablet (40 mg total) by mouth daily., Disp: 90 tablet, Rfl: 3   Calcium  Carb-Cholecalciferol (CALTRATE 600+D3 PO), Take 600 mLs by mouth daily. 1-2 tablets daily, Disp: , Rfl:    lisinopril  (ZESTRIL ) 10 MG tablet, Take 1 tablet (10 mg total) by mouth daily., Disp: 90 tablet, Rfl: 3   vitamin B-12 (CYANOCOBALAMIN ) 1000 MCG tablet, Take 1,000 mcg by mouth as needed., Disp: , Rfl:    zoledronic  acid (RECLAST ) 5 MG/100ML SOLN injection, , Disp: , Rfl:    famotidine  (PEPCID ) 20 MG tablet, Take 1 tablet (20 mg total) by mouth daily., Disp: 90 tablet, Rfl: 3  No Known Allergies ROS neg/noncontributory except as noted HPI/below Gyn 2 yrs ago. Does have some prolapse and has issues w/urination.  Microhematuria-sees urol. Knees bothering her at times-to get up, down stairs, etc. R esp-seeing Dr. Virgin injection-helped but wore off    still walking      Objective:     BP  136/62 (BP Location: Left Arm, Patient Position: Sitting, Cuff Size: Large)   Pulse 60   Temp (!) 97.2 F (36.2 C)   Ht 5' 0.5 (1.537 m)   Wt 138 lb 6.4 oz (62.8 kg)   SpO2 100%   BMI 26.58 kg/m  Wt Readings from Last 3 Encounters:  11/17/23 138 lb 6.4 oz (62.8 kg)  11/03/23 138 lb (62.6 kg)  09/10/23 133 lb (60.3 kg)    Physical Exam   Gen: WDWN NAD HEENT: NCAT, conjunctiva not injected, sclera nonicteric NECK:  supple, no thyromegaly, no nodes, no carotid bruits CARDIAC: RRR, S1S2+, no murmur. DP 2+B LUNGS: CTAB. No wheezes ABDOMEN:  BS+, soft, NTND, No HSM, no masses EXT:  no edema MSK: no gross abnormalities.  NEURO: A&O x3.  CN II-XII intact.  PSYCH: normal mood. Good eye contact     Assessment & Plan:  Essential (primary) hypertension Assessment & Plan: Chronic.  Controlled.  Continue lisinopril  10mg   daily.  Check bp's at least monthly.  Some white coat  Orders: -     CBC with Differential/Platelet -     Comprehensive metabolic panel  Prediabetes Assessment & Plan: Chronic.  Controlled.  Continue diet/exercise  Orders: -     Hemoglobin A1c  Pure hypercholesterolemia Assessment & Plan: Chronic.  Not well controlled but ratios ok.  Continue atorvastatin  40mg  daily  Orders: -     Comprehensive metabolic panel -     Lipid panel  Gastroesophageal reflux disease with esophagitis without hemorrhage Assessment & Plan: Chronic.  Better on pepcid  daily.  Will d/w GI if EGD needed   B12 deficiency Assessment & Plan: Chronic. Intermitt supps.  Try reminders  Orders: -     Vitamin B12  Lung nodules Assessment & Plan: Chronic. Stable.  Check CT for f/u.   Orders: -     CT CHEST WO CONTRAST; Future  Age-related osteoporosis without current pathological fracture Assessment & Plan: Chronic.  Exercises.  Takes calcium /D.  On reclast  w/endo   Gastroesophageal reflux disease without esophagitis -     Famotidine ; Take 1 tablet (20 mg total) by mouth daily.  Dispense: 90 tablet; Refill: 3     Return in about 6 months (around 05/16/2024) for HTN, cholesterol,preDM.  Jenkins CHRISTELLA Carrel, MD

## 2023-11-17 NOTE — Assessment & Plan Note (Signed)
 Chronic.  Controlled.  Continue lisinopril 10mg  daily.  Check bp's at least monthly.  Some white coat

## 2023-11-18 NOTE — Progress Notes (Signed)
 Labs stable.  Same meds

## 2023-11-26 ENCOUNTER — Ambulatory Visit
Admission: RE | Admit: 2023-11-26 | Discharge: 2023-11-26 | Disposition: A | Discharge status: Home / Self Care | Payer: Medicare Other | Source: Ambulatory Visit | Attending: Family Medicine | Admitting: Family Medicine

## 2023-11-26 DIAGNOSIS — R918 Other nonspecific abnormal finding of lung field: Secondary | ICD-10-CM

## 2023-12-07 ENCOUNTER — Encounter: Payer: Self-pay | Admitting: Family Medicine

## 2023-12-07 NOTE — Progress Notes (Signed)
Lung nodules stable so no further f/u on those.  However, new lung nodule-repeat 1 yr New thyroid nodule-please order u/s thyorid.

## 2023-12-08 ENCOUNTER — Other Ambulatory Visit: Payer: Self-pay | Admitting: *Deleted

## 2023-12-08 DIAGNOSIS — E041 Nontoxic single thyroid nodule: Secondary | ICD-10-CM

## 2023-12-08 DIAGNOSIS — I2510 Atherosclerotic heart disease of native coronary artery without angina pectoris: Secondary | ICD-10-CM

## 2023-12-08 DIAGNOSIS — E7800 Pure hypercholesterolemia, unspecified: Secondary | ICD-10-CM

## 2023-12-09 ENCOUNTER — Other Ambulatory Visit: Payer: Self-pay

## 2023-12-09 ENCOUNTER — Ambulatory Visit: Payer: Medicare Other | Attending: Obstetrics and Gynecology | Admitting: Physical Therapy

## 2023-12-09 ENCOUNTER — Encounter: Payer: Self-pay | Admitting: Physical Therapy

## 2023-12-09 DIAGNOSIS — R32 Unspecified urinary incontinence: Secondary | ICD-10-CM | POA: Insufficient documentation

## 2023-12-09 DIAGNOSIS — R278 Other lack of coordination: Secondary | ICD-10-CM | POA: Insufficient documentation

## 2023-12-09 DIAGNOSIS — M6281 Muscle weakness (generalized): Secondary | ICD-10-CM | POA: Insufficient documentation

## 2023-12-09 NOTE — Therapy (Signed)
 OUTPATIENT PHYSICAL THERAPY FEMALE PELVIC EVALUATION   Patient Name: Holly Russell MRN: [id number] DOB:[birth year], 73 y.o., female Today's Date: 12/09/2023  END OF SESSION:  PT End of Session - 12/09/23 1457     Visit Number 1    Authorization Type Medicare/BCBS    PT Start Time 0200    PT Stop Time 0256    PT Time Calculation (min) 56 min    Activity Tolerance Patient tolerated treatment well    Behavior During Therapy [facility] for tasks assessed/performed             Past Medical History:  Diagnosis Date   Acid reflux    Agatston CAC score 100-199    Basal cell carcinoma    Cataract    Coronary atherosclerosis    Deviated nasal septum    Eczema    Gastroesophageal reflux disease    H/O bone density study 2019-2015   H/O echocardiogram 2015   H/O exercise stress test 3/4-5 2015   Hearing loss    High blood pressure    High cholesterol    History of chest pain    History of prediabetes    Hx of mammogram 2018-2017-2016-2015-2014   Hyperlipidemia    IBS (irritable bowel syndrome)    Intervertebral disc disease    Iron deficiency anemia    On statin therapy    Osteoarthritis    Osteopenia    unspecified  Location   SNHL (sensorineural hearing loss)    Spinal stenosis    Tinnitus of left ear    Vitamin D  deficiency    Past Surgical History:  Procedure Laterality Date   ENDOMETRIAL BIOPSY     EYE SURGERY     lazy eye when 73yo   MOHS SURGERY     for basal cell to the right of right eye   TONSILLECTOMY     Patient Active Problem List   Diagnosis Date Noted   Well woman exam with routine gynecological exam 11/03/2023   Urinary incontinence 11/03/2023   Sensorineural hearing loss, bilateral 09/13/2023   Prediabetes 05/09/2023   B12 deficiency 05/09/2023   Lung nodules 05/09/2023   Age-related osteoporosis without current pathological fracture 09/04/2022   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 02/19/2021   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 02/19/2021   Urethral  caruncle 02/19/2021   Microscopic hematuria 02/07/2021   Essential (primary) hypertension 09/15/2020   Pure hypercholesterolemia 09/15/2020   Spinal stenosis 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 09/15/2020   Gastroesophageal reflux disease with esophagitis without hemorrhage 09/15/2020   Presbycusis of both ears 09/15/2020   Tinnitus of both ears 09/15/2020   Estrogen deficiency 09/15/2020   Age-related cataract of both eyes 09/15/2020   Macular pucker, bilateral 09/15/2020    PCP: Wendolyn Jenkins Jansky, MD   REFERRING PROVIDER: Dallie Vera GAILS, MD  REFERRING DIAG: R32 (ICD-10-CM) - Urinary incontinence, unspecified type  THERAPY DIAG:  Muscle weakness (generalized)  Other lack of coordination  Rationale for Evaluation and Treatment: Rehabilitation  ONSET DATE: several years ago   SUBJECTIVE:  SUBJECTIVE STATEMENT: Patient reports to PT with chief complaint of frequent urination that began a few years ago. Patient states that her husband enjoys travelling and her urinary frequency can be limiting in that nature due to having to find a restroom frequently. Fluid intake: Yes: drinks water, half-caff coffee 1-3x/day    PAIN:  Are you having pain? No NPRS scale: 0/10  PRECAUTIONS: None  RED FLAGS: None   WEIGHT BEARING RESTRICTIONS: No  FALLS:  Has patient fallen in last 6 months? No  LIVING ENVIRONMENT: Lives with: lives with their family Lives in: House/apartment  OCCUPATION: retired   PLOF: Independent with basic ADLs  PATIENT GOALS: gain control of urinary function to decrease disturbances with travel, it causes anxiety  PERTINENT HISTORY:  Diagnosis Date Comment Source  Acid reflux     Agatston CAC score 100-199     Basal cell carcinoma     Cataract     Coronary  atherosclerosis     Deviated nasal septum     Eczema     Gastroesophageal reflux disease     H/O bone density study 2019-2015    H/O echocardiogram 2015    H/O exercise stress test 3/4-5 2015    Hearing loss     High blood pressure     High cholesterol     History of chest pain     History of prediabetes     Hx of mammogram 2018-2017-2016-2015-2014    Hyperlipidemia     IBS (irritable bowel syndrome)     Intervertebral disc disease     Iron deficiency anemia     On statin therapy     Osteoarthritis     Osteopenia  unspecified  Location   SNHL (sensorineural hearing loss)     Spinal stenosis     Tinnitus of left ear     Vitamin D  deficiency      Sexual abuse: No  BOWEL MOVEMENT: Pain with bowel movement: No Type of bowel movement:Type (Bristol Stool Scale) BRISTOL STOOL SCALE type 1-4 depending on water intake, Frequency 1x/day, Strain No, and Splinting no Fully empty rectum: Yes:   Leakage: No Pads: No  URINATION: Pain with urination: No Fully empty bladder: No Stream: Strong Urgency: Yes: but does not cause incontinence  Frequency: 5-6x/day  Leakage: no regular leakage, but have had instances of leakage in the past  Pads: Yes: if she knows she will be away from a bathroom for a while, but has never experienced wetness in pad   INTERCOURSE: Not sexually active   PREGNANCY: Vaginal deliveries 2  PROLAPSE: None   OBJECTIVE:  Note: Objective measures were completed at Evaluation unless otherwise noted.  PATIENT SURVEYS:  PFIQ-7 :assess at follow up visit   COGNITION: Overall cognitive status: Within functional limits for tasks assessed   SENSATION: Light touch: Appears intact Proprioception: Appears intact  FUNCTIONAL TESTS:  Squat: WNL  GAIT: Mild trendelenburg gait pattern with ambulation   POSTURE: rounded shoulders and forward head  PELVIC ALIGNMENT: WNL  LUMBARAROM/PROM:  A/PROM A/PROM  eval  Flexion 25% limited  Extension 25% limited   Right lateral flexion 25% limited  Left lateral flexion 25% limited  Right rotation   Left rotation    (Blank rows = not tested)  PALPATION:   General: patient not tender to palpation along bilateral adductors/pubic bone/lower abdominals/hip flexors                 External Perineal Exam: noted vaginal dryness  Internal Pelvic Floor: general weakness throughout, no pain with palpation of internal pelvic floor musculature   Patient confirms identification and approves PT to assess internal pelvic floor and treatment Yes No emotional/communication barriers or cognitive limitation. Patient is motivated to learn. Patient understands and agrees with treatment goals and plan. PT explains patient will be examined in standing, sitting, and lying down to see how their muscles and joints work. When they are ready, they will be asked to remove their underwear so PT can examine their perineum. The patient is also given the option of providing their own chaperone as one is not provided in our facility. The patient also has the right and is explained the right to defer or refuse any part of the evaluation or treatment including the internal exam. With the patient's consent, PT will use one gloved finger to gently assess the muscles of the pelvic floor, seeing how well it contracts and relaxes and if there is muscle symmetry. After, the patient will get dressed and PT and patient will discuss exam findings and plan of care. PT and patient discuss plan of care, schedule, attendance policy and HEP activities.  PELVIC MMT:   MMT eval  Vaginal 3/5, 2 second isometric, 3 quick flicks   Internal Anal Sphincter   External Anal Sphincter   Puborectalis   Diastasis Recti   (Blank rows = not tested)        TONE: General decrease in muscle tone throughout bilateral pelvic floor musculature   PROLAPSE: N/A  TODAY'S TREATMENT:                                                                                                                               DATE:   12/09/23 EVAL: Examination completed, findings reviewed, pt educated on POC, HEP, and neuromuscular re-education and self care. Pt motivated to participate in PT and agreeable to attempt recommendations.  Neuromotor re-education:   Supine diaphragmatic breathing with pelvic floor lengthening on inhalation and pelvic floor shortening on exhalation 2x10 breaths  Urge suppression drill for urinary urgency and incontinence  Self care: pelvic floor AROM, vaginal moisturizers, connection between diaphragm and pelvic floor musculature, urge suppression drill  PATIENT EDUCATION:  Education details: pelvic floor AROM, vaginal moisturizers, connection between diaphragm and pelvic floor musculature, urge suppression drill Person educated: Patient Education method: Explanation, Demonstration, Tactile cues, Verbal cues, and Handouts Education comprehension: verbalized understanding, returned demonstration, verbal cues required, and tactile cues required  HOME EXERCISE PROGRAM: Access Code: [facility] URL: [email or web address] Date: 12/09/2023 Prepared by: Celena Domino  Exercises - Supine Pelvic Floor Contraction  - 1 x daily - 7 x weekly - 2 sets - 10 reps  ASSESSMENT:  CLINICAL IMPRESSION: Patient is a 73 y.o. female who was seen today for physical therapy evaluation and treatment for urinary urgency and incontinence. Patient is experiencing urinary urgency that causes her to rush to the bathroom and has experienced mild urinary incontinence in the  past. Urinary urgency has increased over the past few years and pt is traveling later this year and is afraid that it will affect her travels. Pt fully consented to today's internal pelvic floor examination, which revealed pelvic floor musculature weakness and lack of coordination with diaphragmatic breathing interventions. Following internal and external tactile cueing,  patient was able to relax her pelvic floor with inhalation and allow her ribs to expand. Patient reports full understanding of urge drill for urinary urgency at home and tolerated all of today's treatments well. Pt would benefit from additional PT to further address lack of coordination and pelvic floor weakness.   OBJECTIVE IMPAIRMENTS: decreased coordination, decreased endurance, decreased ROM, and decreased strength.   ACTIVITY LIMITATIONS: continence  PARTICIPATION LIMITATIONS:  none  PERSONAL FACTORS: 1-2 comorbidities: IBS, osteopenia   are also affecting patient's functional outcome.   REHAB POTENTIAL: Good  CLINICAL DECISION MAKING: Stable/uncomplicated  EVALUATION COMPLEXITY: Low   GOALS: Goals reviewed with patient? Yes  SHORT TERM GOALS: Target date: 01/06/2024  Pt will be independent with HEP.  Baseline: Goal status: INITIAL  2.  Pt will be able to teach back and utilize urge suppression technique in order to help reduce number of trips to the bathroom.   Baseline:  Goal status: INITIAL  3.  Pt will be able to go 2-3 hours in between voids without urgency or incontinence in order to improve QOL and perform all functional activities with less difficulty.   Baseline: 1-2 hours  Goal status: INITIAL   LONG TERM GOALS: Target date: 01/06/2024  Pt will be independent with advanced HEP.  Baseline:  Goal status: INITIAL  2.  Pt to demonstrate improved coordination of pelvic floor and breathing mechanics with 10# squat with appropriate synergistic patterns to decrease pain and leakage at least 75% of the time.   Baseline:  Goal status: INITIAL  3.  Pt to report improved time between bladder voids to at least 2 hours for improved QOL with decreased urinary frequency.   Baseline: 1 hour Goal status: INITIAL  4.  Pt to demonstrate at least 4/5 pelvic floor strength for improved pelvic stability and decreased strain at pelvic floor/ decrease leakage.  Baseline: 3/5 Goal  status: INITIAL   PLAN:  PT FREQUENCY: 1x/week  PT DURATION: 8 weeks  PLANNED INTERVENTIONS: 97110-Therapeutic exercises, 97530- Therapeutic activity, [id number]- Neuromuscular re-education, 97535- Self Care, [id number]- Manual therapy, [phone]- Aquatic Therapy, [phone]- Ultrasound, Patient/Family education, Taping, Dry Needling, Joint mobilization, Spinal manipulation, Scar mobilization, Cryotherapy, and Moist heat  PLAN FOR NEXT SESSION: internal manual interventions to assess pelvic floor ROM, review urge drill, introduce seated diaphragmatic breathing?  Celena Domino, PT, DPT 12/09/23 5:10 PM

## 2023-12-09 NOTE — Patient Instructions (Signed)

## 2023-12-11 ENCOUNTER — Ambulatory Visit
Admission: RE | Admit: 2023-12-11 | Discharge: 2023-12-11 | Disposition: A | Discharge status: Home / Self Care | Payer: Medicare Other | Source: Ambulatory Visit | Attending: Family Medicine | Admitting: Family Medicine

## 2023-12-11 DIAGNOSIS — E041 Nontoxic single thyroid nodule: Secondary | ICD-10-CM

## 2023-12-13 ENCOUNTER — Encounter: Payer: Self-pay | Admitting: Family Medicine

## 2023-12-13 NOTE — Progress Notes (Signed)
 Thyroid  nodule is large so needs a needle bx.  We can send order to radiology to do it if pt willing.

## 2023-12-15 ENCOUNTER — Encounter: Payer: Self-pay | Admitting: Gastroenterology

## 2023-12-15 ENCOUNTER — Other Ambulatory Visit: Payer: Self-pay | Admitting: *Deleted

## 2023-12-15 ENCOUNTER — Other Ambulatory Visit: Payer: Self-pay | Admitting: Family Medicine

## 2023-12-15 DIAGNOSIS — E041 Nontoxic single thyroid nodule: Secondary | ICD-10-CM

## 2023-12-15 NOTE — Telephone Encounter (Signed)
 Orders placed.

## 2023-12-17 ENCOUNTER — Ambulatory Visit: Payer: Medicare Other | Admitting: Physical Therapy

## 2023-12-17 ENCOUNTER — Encounter: Payer: Self-pay | Admitting: Physical Therapy

## 2023-12-17 DIAGNOSIS — M6281 Muscle weakness (generalized): Secondary | ICD-10-CM | POA: Diagnosis not present

## 2023-12-17 DIAGNOSIS — R278 Other lack of coordination: Secondary | ICD-10-CM

## 2023-12-17 NOTE — Therapy (Signed)
OUTPATIENT PHYSICAL THERAPY FEMALE PELVIC TREATMENT   Patient Name: Holly Russell MRN: [id number] DOB:11-28-1950, 73 y.o., female Today's Date: 12/17/2023  END OF SESSION:  PT End of Session - 12/17/23 1004     Visit Number 2    Authorization Type Medicare/BCBS    PT Start Time 0932    PT Stop Time 1015    PT Time Calculation (min) 43 min    Activity Tolerance Patient tolerated treatment well    Behavior During Therapy [facility] for tasks assessed/performed              Past Medical History:  Diagnosis Date   Acid reflux    Agatston CAC score 100-199    Basal cell carcinoma    Cataract    Coronary atherosclerosis    Deviated nasal septum    Eczema    Gastroesophageal reflux disease    H/O bone density study 2019-2015   H/O echocardiogram 2015   H/O exercise stress test 3/4-5 2015   Hearing loss    High blood pressure    High cholesterol    History of chest pain    History of prediabetes    Hx of mammogram 2018-2017-2016-2015-2014   Hyperlipidemia    IBS (irritable bowel syndrome)    Intervertebral disc disease    Iron deficiency anemia    On statin therapy    Osteoarthritis    Osteopenia    unspecified  Location   SNHL (sensorineural hearing loss)    Spinal stenosis    Tinnitus of left ear    Vitamin D deficiency    Past Surgical History:  Procedure Laterality Date   ENDOMETRIAL BIOPSY     EYE SURGERY     lazy eye when 73yo   MOHS SURGERY     for basal cell to the right of right eye   TONSILLECTOMY     Patient Active Problem List   Diagnosis Date Noted   Well woman exam with routine gynecological exam 11/03/2023   Urinary incontinence 11/03/2023   Sensorineural hearing loss, bilateral 09/13/2023   Prediabetes 05/09/2023   B12 deficiency 05/09/2023   Lung nodules 05/09/2023   Age-related osteoporosis without current pathological fracture 09/04/2022   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 02/19/2021   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 02/19/2021   Urethral  caruncle 02/19/2021   Microscopic hematuria 02/07/2021   Essential (primary) hypertension 09/15/2020   Pure hypercholesterolemia 09/15/2020   Spinal stenosis 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 09/15/2020   Gastroesophageal reflux disease with esophagitis without hemorrhage 09/15/2020   Presbycusis of both ears 09/15/2020   Tinnitus of both ears 09/15/2020   Estrogen deficiency 09/15/2020   Age-related cataract of both eyes 09/15/2020   Macular pucker, bilateral 09/15/2020    PCP: Jeani Sow, MD  REFERRING PROVIDER: Rosalyn Gess, MD  REFERRING DIAG: R32 (ICD-10-CM) - Urinary incontinence, unspecified type  THERAPY DIAG:  Muscle weakness (generalized)  Other lack of coordination  Rationale for Evaluation and Treatment: Rehabilitation  ONSET DATE: several years ago   SUBJECTIVE:  SUBJECTIVE STATEMENT: Patient reports that she is having a hard time exhaling when exhaling, but she is practicing. She has been consistent with HEP. Urge drill has been working for the patient.  Fluid intake: Yes: drinks water, half-caff coffee 1-3x/day    PAIN:  Are you having pain? No NPRS scale: 0/10  PRECAUTIONS: None  RED FLAGS: None   WEIGHT BEARING RESTRICTIONS: No  FALLS:  Has patient fallen in last 6 months? No  LIVING ENVIRONMENT: Lives with: lives with their family Lives in: House/apartment  OCCUPATION: retired   PLOF: Independent with basic ADLs  PATIENT GOALS: gain control of urinary function to decrease disturbances with travel, it causes anxiety  PERTINENT HISTORY:  Diagnosis Date Comment Source  Acid reflux     Agatston CAC score 100-199     Basal cell carcinoma     Cataract     Coronary atherosclerosis     Deviated nasal septum     Eczema      Gastroesophageal reflux disease     H/O bone density study 2019-2015    H/O echocardiogram 2015    H/O exercise stress test 3/4-5 2015    Hearing loss     High blood pressure     High cholesterol     History of chest pain     History of prediabetes     Hx of mammogram 2018-2017-2016-2015-2014    Hyperlipidemia     IBS (irritable bowel syndrome)     Intervertebral disc disease     Iron deficiency anemia     On statin therapy     Osteoarthritis     Osteopenia  unspecified  Location   SNHL (sensorineural hearing loss)     Spinal stenosis     Tinnitus of left ear     Vitamin D deficiency      Sexual abuse: No  BOWEL MOVEMENT: Pain with bowel movement: No Type of bowel movement:Type (Bristol Stool Scale) BRISTOL STOOL SCALE type 1-4 depending on water intake, Frequency 1x/day, Strain No, and Splinting no Fully empty rectum: Yes:   Leakage: No Pads: No  URINATION: Pain with urination: No Fully empty bladder: No Stream: Strong Urgency: Yes: but does not cause incontinence  Frequency: 5-6x/day  Leakage: no regular leakage, but have had instances of leakage in the past  Pads: Yes: if she knows she will be away from a bathroom for a while, but has never experienced wetness in pad   INTERCOURSE: Not sexually active   PREGNANCY: Vaginal deliveries 2  PROLAPSE: None   OBJECTIVE:  Note: Objective measures were completed at Evaluation unless otherwise noted.  PATIENT SURVEYS:  PFIQ-7: 29  COGNITION: Overall cognitive status: Within functional limits for tasks assessed   SENSATION: Light touch: Appears intact Proprioception: Appears intact  FUNCTIONAL TESTS:  Squat: WNL  GAIT: Mild trendelenburg gait pattern with ambulation   POSTURE: rounded shoulders and forward head  PELVIC ALIGNMENT: WNL  LUMBARAROM/PROM:  A/PROM A/PROM  eval  Flexion 25% limited  Extension 25% limited  Right lateral flexion 25% limited  Left lateral flexion 25% limited  Right  rotation   Left rotation    (Blank rows = not tested)  PALPATION:   General: patient not tender to palpation along bilateral adductors/pubic bone/lower abdominals/hip flexors                 External Perineal Exam: noted vaginal dryness  Internal Pelvic Floor: general weakness throughout, no pain with palpation of internal pelvic floor musculature   Patient confirms identification and approves PT to assess internal pelvic floor and treatment Yes No emotional/communication barriers or cognitive limitation. Patient is motivated to learn. Patient understands and agrees with treatment goals and plan. PT explains patient will be examined in standing, sitting, and lying down to see how their muscles and joints work. When they are ready, they will be asked to remove their underwear so PT can examine their perineum. The patient is also given the option of providing their own chaperone as one is not provided in our facility. The patient also has the right and is explained the right to defer or refuse any part of the evaluation or treatment including the internal exam. With the patient's consent, PT will use one gloved finger to gently assess the muscles of the pelvic floor, seeing how well it contracts and relaxes and if there is muscle symmetry. After, the patient will get dressed and PT and patient will discuss exam findings and plan of care. PT and patient discuss plan of care, schedule, attendance policy and HEP activities.  PELVIC MMT:   MMT eval  Vaginal 3/5, 2 second isometric, 3 quick flicks   Internal Anal Sphincter   External Anal Sphincter   Puborectalis   Diastasis Recti   (Blank rows = not tested)        TONE: General decrease in muscle tone throughout bilateral pelvic floor musculature   PROLAPSE: N/A  TODAY'S TREATMENT:                                                                                                                              DATE:    12/09/23 EVAL: Examination completed, findings reviewed, pt educated on POC, HEP, and neuromuscular re-education and self care. Pt motivated to participate in PT and agreeable to attempt recommendations.  Neuromotor re-education:   Supine diaphragmatic breathing with pelvic floor lengthening on inhalation and pelvic floor shortening on exhalation 2x10 breaths  Urge suppression drill for urinary urgency and incontinence  Self care: pelvic floor AROM, vaginal moisturizers, connection between diaphragm and pelvic floor musculature, urge suppression drill  12/17/23: Neuromotor re-education:   Seated diaphragmatic breathing with pelvic floor lengthening on inhalation and pelvic floor shortening on exhalation 2x10 breaths  Urge suppression drill for urinary urgency and incontinence  Seated quick flicks pelvic contractions 2x10  Hooklying adductor ball squeeze with diaphragmatic breathing 2x59min  Therapeutic activity  pelvic floor AROM, vaginal moisturizers, connection between diaphragm and pelvic floor musculature, urge suppression drill Sit to stand with exhalation 2x5  Bridge + exhale 2x10  Open books + diaphragmatic breathing 2x10   PATIENT EDUCATION:  Education details: pelvic floor AROM, vaginal moisturizers, connection between diaphragm and pelvic floor musculature, urge suppression drill Person educated: Patient Education method: Explanation, Demonstration, Tactile cues, Verbal cues, and Handouts Education comprehension: verbalized understanding, returned demonstration, verbal cues required, and tactile  cues required  HOME EXERCISE PROGRAM: Access Code: [facility] URL: [email or web address] Date: 12/17/2023 Prepared by: Earna Coder  Exercises - Seated Pelvic Floor Contraction  - 1 x daily - 7 x weekly - 2 sets - 10 reps - Seated Quick Flick Pelvic Floor Contractions  - 1 x daily - 7 x weekly - 2 sets - 10 reps - Sit to Stand Without Arm Support  - 1 x daily - 7 x  weekly - 2 sets - 10 reps - Supine Bridge  - 1 x daily - 7 x weekly - 2 sets - 10 reps - Sidelying Thoracic Rotation with Open Book  - 1 x daily - 7 x weekly - 2 sets - 10 reps  ASSESSMENT:  CLINICAL IMPRESSION: Patient is a 73 y.o. female who was seen today for physical therapy treatment for urinary urgency and incontinence. Patient's urinary leakage while walking to bathroom has decreased with utilization of urge drill. Pelvic floor training was progressed to seated position today and quick flick contractions were introduced. Patient required consistent verbal cueing to perform diaphragmatic breathing correctly in seated position. Introduction to functional training with pelvic floor control went very well and patient was able to coordinate her exhalation with exertion. Patient reports full understanding of urge drill for urinary urgency at home and tolerated all of today's treatments well; no leakage or increase in pain following today's session. Pt would benefit from additional PT to further address lack of coordination and pelvic floor weakness.   OBJECTIVE IMPAIRMENTS: decreased coordination, decreased endurance, decreased ROM, and decreased strength.   ACTIVITY LIMITATIONS: continence  PARTICIPATION LIMITATIONS:  none  PERSONAL FACTORS: 1-2 comorbidities: IBS, osteopenia   are also affecting patient's functional outcome.   REHAB POTENTIAL: Good  CLINICAL DECISION MAKING: Stable/uncomplicated  EVALUATION COMPLEXITY: Low   GOALS: Goals reviewed with patient? Yes  SHORT TERM GOALS: Target date: 01/06/2024  Pt will be independent with HEP.  Baseline: Goal status: INITIAL  2.  Pt will be able to teach back and utilize urge suppression technique in order to help reduce number of trips to the bathroom.   Baseline:  Goal status: INITIAL  3.  Pt will be able to go 2-3 hours in between voids without urgency or incontinence in order to improve QOL and perform all functional activities  with less difficulty.   Baseline: 1-2 hours  Goal status: INITIAL   LONG TERM GOALS: Target date: 01/06/2024  Pt will be independent with advanced HEP.  Baseline:  Goal status: INITIAL  2.  Pt to demonstrate improved coordination of pelvic floor and breathing mechanics with 10# squat with appropriate synergistic patterns to decrease pain and leakage at least 75% of the time.   Baseline:  Goal status: INITIAL  3.  Pt to report improved time between bladder voids to at least 2 hours for improved QOL with decreased urinary frequency.   Baseline: 1 hour Goal status: INITIAL  4.  Pt to demonstrate at least 4/5 pelvic floor strength for improved pelvic stability and decreased strain at pelvic floor/ decrease leakage.  Baseline: 3/5 Goal status: INITIAL   PLAN:  PT FREQUENCY: 1x/week  PT DURATION: 8 weeks  PLANNED INTERVENTIONS: 97110-Therapeutic exercises, 97530- Therapeutic activity, [id number]- Neuromuscular re-education, 97535- Self Care, [id number]- Manual therapy, [phone]- Aquatic Therapy, [phone]- Ultrasound, Patient/Family education, Taping, Dry Needling, Joint mobilization, Spinal manipulation, Scar mobilization, Cryotherapy, and Moist heat  PLAN FOR NEXT SESSION: internal manual interventions to assess pelvic floor ROM if needed, review urge drill, sit to stand  with ball squeeze, sidelying hip abduction with ball press, progress STS to squat, lower trunk rotation   Earna Coder, PT, DPT 12/17/23 10:14 AM

## 2023-12-22 ENCOUNTER — Telehealth: Payer: Self-pay

## 2023-12-22 NOTE — Telephone Encounter (Signed)
 Called and left voicemail.  Trying to move appt from 12/24/23 to 2/17 or 2/18 if possible.

## 2023-12-23 ENCOUNTER — Other Ambulatory Visit (HOSPITAL_COMMUNITY)
Admission: RE | Admit: 2023-12-23 | Discharge: 2023-12-23 | Disposition: A | Discharge status: Home / Self Care | Payer: Medicare Other | Source: Ambulatory Visit | Attending: Diagnostic Radiology | Admitting: Diagnostic Radiology

## 2023-12-23 ENCOUNTER — Ambulatory Visit
Admission: RE | Admit: 2023-12-23 | Discharge: 2023-12-23 | Disposition: A | Discharge status: Home / Self Care | Payer: Medicare Other | Source: Ambulatory Visit | Attending: Family Medicine | Admitting: Family Medicine

## 2023-12-23 DIAGNOSIS — E041 Nontoxic single thyroid nodule: Secondary | ICD-10-CM | POA: Diagnosis present

## 2023-12-24 ENCOUNTER — Encounter: Payer: Medicare Other | Admitting: Physical Therapy

## 2023-12-24 LAB — CYTOLOGY - NON PAP

## 2023-12-28 ENCOUNTER — Encounter: Payer: Self-pay | Admitting: Family Medicine

## 2023-12-28 NOTE — Progress Notes (Signed)
 Follicular lesion of undetermined significance.  Sent for further testing.

## 2023-12-30 ENCOUNTER — Ambulatory Visit: Payer: Medicare Other | Admitting: Physical Therapy

## 2023-12-30 DIAGNOSIS — M6281 Muscle weakness (generalized): Secondary | ICD-10-CM

## 2023-12-30 DIAGNOSIS — R278 Other lack of coordination: Secondary | ICD-10-CM

## 2023-12-30 NOTE — Therapy (Signed)
 OUTPATIENT PHYSICAL THERAPY FEMALE PELVIC TREATMENT   Patient Name: Holly Russell MRN: [id number] DOB:25-May-1951, 73 y.o., female Today's Date: 12/30/2023  END OF SESSION:  PT End of Session - 12/30/23 1607     Visit Number 3    Authorization Type Medicare/BCBS    PT Start Time 0325    PT Stop Time 0407    PT Time Calculation (min) 42 min    Activity Tolerance Patient tolerated treatment well    Behavior During Therapy [facility] for tasks assessed/performed               Past Medical History:  Diagnosis Date   Acid reflux    Agatston CAC score 100-199    Basal cell carcinoma    Cataract    Coronary atherosclerosis    Deviated nasal septum    Eczema    Gastroesophageal reflux disease    H/O bone density study 2019-2015   H/O echocardiogram 2015   H/O exercise stress test 3/4-5 2015   Hearing loss    High blood pressure    High cholesterol    History of chest pain    History of prediabetes    Hx of mammogram 2018-2017-2016-2015-2014   Hyperlipidemia    IBS (irritable bowel syndrome)    Intervertebral disc disease    Iron deficiency anemia    On statin therapy    Osteoarthritis    Osteopenia    unspecified  Location   SNHL (sensorineural hearing loss)    Spinal stenosis    Tinnitus of left ear    Vitamin D deficiency    Past Surgical History:  Procedure Laterality Date   ENDOMETRIAL BIOPSY     EYE SURGERY     lazy eye when 73yo   MOHS SURGERY     for basal cell to the right of right eye   TONSILLECTOMY     Patient Active Problem List   Diagnosis Date Noted   Well woman exam with routine gynecological exam 11/03/2023   Urinary incontinence 11/03/2023   Sensorineural hearing loss, bilateral 09/13/2023   Prediabetes 05/09/2023   B12 deficiency 05/09/2023   Lung nodules 05/09/2023   Age-related osteoporosis without current pathological fracture 09/04/2022   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 02/19/2021   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 02/19/2021   Urethral  caruncle 02/19/2021   Microscopic hematuria 02/07/2021   Essential (primary) hypertension 09/15/2020   Pure hypercholesterolemia 09/15/2020   Spinal stenosis 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 09/15/2020   Gastroesophageal reflux disease with esophagitis without hemorrhage 09/15/2020   Presbycusis of both ears 09/15/2020   Tinnitus of both ears 09/15/2020   Estrogen deficiency 09/15/2020   Age-related cataract of both eyes 09/15/2020   Macular pucker, bilateral 09/15/2020    PCP: Jeani Sow, MD  REFERRING PROVIDER: Rosalyn Gess, MD  REFERRING DIAG: R32 (ICD-10-CM) - Urinary incontinence, unspecified type  THERAPY DIAG:  Muscle weakness (generalized)  Other lack of coordination  Rationale for Evaluation and Treatment: Rehabilitation  ONSET DATE: several years ago   SUBJECTIVE:  SUBJECTIVE STATEMENT: Patient reports that she is doing well today, she had a mole removed from her abdomen last week and she had some soreness in the stomach from this, but this morning she had her stitches removed; no restrictions. No urinary leakage to report since last visit, which patient is very pleased with. Patient has gotten better about voiding in 2-3 hour increments.  Fluid intake: Yes: drinks water, half-caff coffee 1-3x/day    PAIN:  Are you having pain? No NPRS scale: 0/10  PRECAUTIONS: None  RED FLAGS: None   WEIGHT BEARING RESTRICTIONS: No  FALLS:  Has patient fallen in last 6 months? No  LIVING ENVIRONMENT: Lives with: lives with their family Lives in: House/apartment  OCCUPATION: retired   PLOF: Independent with basic ADLs  PATIENT GOALS: gain control of urinary function to decrease disturbances with travel, it causes anxiety  PERTINENT HISTORY:  Diagnosis Date  Comment Source  Acid reflux     Agatston CAC score 100-199     Basal cell carcinoma     Cataract     Coronary atherosclerosis     Deviated nasal septum     Eczema     Gastroesophageal reflux disease     H/O bone density study 2019-2015    H/O echocardiogram 2015    H/O exercise stress test 3/4-5 2015    Hearing loss     High blood pressure     High cholesterol     History of chest pain     History of prediabetes     Hx of mammogram 2018-2017-2016-2015-2014    Hyperlipidemia     IBS (irritable bowel syndrome)     Intervertebral disc disease     Iron deficiency anemia     On statin therapy     Osteoarthritis     Osteopenia  unspecified  Location   SNHL (sensorineural hearing loss)     Spinal stenosis     Tinnitus of left ear     Vitamin D deficiency      Sexual abuse: No  BOWEL MOVEMENT: Pain with bowel movement: No Type of bowel movement:Type (Bristol Stool Scale) BRISTOL STOOL SCALE type 1-4 depending on water intake, Frequency 1x/day, Strain No, and Splinting no Fully empty rectum: Yes:   Leakage: No Pads: No  URINATION: Pain with urination: No Fully empty bladder: No Stream: Strong Urgency: Yes: but does not cause incontinence  Frequency: 5-6x/day  Leakage: no regular leakage, but have had instances of leakage in the past  Pads: Yes: if she knows she will be away from a bathroom for a while, but has never experienced wetness in pad   INTERCOURSE: Not sexually active   PREGNANCY: Vaginal deliveries 2  PROLAPSE: None   OBJECTIVE:  Note: Objective measures were completed at Evaluation unless otherwise noted.  PATIENT SURVEYS:  PFIQ-7: 29  COGNITION: Overall cognitive status: Within functional limits for tasks assessed   SENSATION: Light touch: Appears intact Proprioception: Appears intact  FUNCTIONAL TESTS:  Squat: WNL  GAIT: Mild trendelenburg gait pattern with ambulation   POSTURE: rounded shoulders and forward head  PELVIC ALIGNMENT:  WNL  LUMBARAROM/PROM:  A/PROM A/PROM  eval  Flexion 25% limited  Extension 25% limited  Right lateral flexion 25% limited  Left lateral flexion 25% limited  Right rotation   Left rotation    (Blank rows = not tested)  PALPATION:   General: patient not tender to palpation along bilateral adductors/pubic bone/lower abdominals/hip flexors  External Perineal Exam: noted vaginal dryness                             Internal Pelvic Floor: general weakness throughout, no pain with palpation of internal pelvic floor musculature   Patient confirms identification and approves PT to assess internal pelvic floor and treatment Yes No emotional/communication barriers or cognitive limitation. Patient is motivated to learn. Patient understands and agrees with treatment goals and plan. PT explains patient will be examined in standing, sitting, and lying down to see how their muscles and joints work. When they are ready, they will be asked to remove their underwear so PT can examine their perineum. The patient is also given the option of providing their own chaperone as one is not provided in our facility. The patient also has the right and is explained the right to defer or refuse any part of the evaluation or treatment including the internal exam. With the patient's consent, PT will use one gloved finger to gently assess the muscles of the pelvic floor, seeing how well it contracts and relaxes and if there is muscle symmetry. After, the patient will get dressed and PT and patient will discuss exam findings and plan of care. PT and patient discuss plan of care, schedule, attendance policy and HEP activities.  PELVIC MMT:   MMT eval  Vaginal 3/5, 2 second isometric, 3 quick flicks   Internal Anal Sphincter   External Anal Sphincter   Puborectalis   Diastasis Recti   (Blank rows = not tested)        TONE: General decrease in muscle tone throughout bilateral pelvic floor musculature    PROLAPSE: N/A  TODAY'S TREATMENT:                                                                                                                              DATE:   12/09/23 EVAL: Examination completed, findings reviewed, pt educated on POC, HEP, and neuromuscular re-education and self care. Pt motivated to participate in PT and agreeable to attempt recommendations.  Neuromotor re-education:   Supine diaphragmatic breathing with pelvic floor lengthening on inhalation and pelvic floor shortening on exhalation 2x10 breaths  Urge suppression drill for urinary urgency and incontinence  Self care: pelvic floor AROM, vaginal moisturizers, connection between diaphragm and pelvic floor musculature, urge suppression drill  12/17/23: Neuromotor re-education:   Seated diaphragmatic breathing with pelvic floor lengthening on inhalation and pelvic floor shortening on exhalation 2x10 breaths  Urge suppression drill for urinary urgency and incontinence  Seated quick flicks pelvic contractions 2x10  Hooklying adductor ball squeeze with diaphragmatic breathing 2x71min  Therapeutic activity  pelvic floor AROM, vaginal moisturizers, connection between diaphragm and pelvic floor musculature, urge suppression drill Sit to stand with exhalation 2x5  Bridge + exhale 2x10  Open books + diaphragmatic breathing 2x10  12/30/23: Neuromotor re-education:   Sidelying clamshell + reverse clamshell +  diaphragmatic breathing 2x10  Sit to stand + hip abduction (green theraband) + diaphragmatic breathing 2x10  Supine march with diaphragmatic breathing 2x20 alternating sides  Seated diaphragmatic breathing + pelvic floor AROM with breathing 2x10  Therapeutic activity  pelvic floor AROM, vaginal moisturizers, connection between diaphragm and pelvic floor musculature, urge suppression drill  PATIENT EDUCATION:  Education details: pelvic floor AROM, vaginal moisturizers, connection between diaphragm and pelvic floor  musculature, urge suppression drill Person educated: Patient Education method: Explanation, Demonstration, Tactile cues, Verbal cues, and Handouts Education comprehension: verbalized understanding, returned demonstration, verbal cues required, and tactile cues required  HOME EXERCISE PROGRAM: Access Code: [facility] URL: [email or web address] Date: 12/30/2023 Prepared by: Earna Coder  Exercises - Seated Pelvic Floor Contraction  - 1 x daily - 7 x weekly - 2 sets - 10 reps - Sidelying Thoracic Rotation with Open Book  - 1 x daily - 7 x weekly - 2 sets - 10 reps - Supine March  - 1 x daily - 7 x weekly - 2 sets - 20 reps - Clamshell  - 1 x daily - 7 x weekly - 2 sets - 10 reps - Sidelying Reverse Clamshell  - 1 x daily - 7 x weekly - 2 sets - 10 reps - Sit to Stand with Resistance Around Legs  - 1 x daily - 7 x weekly - 2 sets - 10 reps  ASSESSMENT:  CLINICAL IMPRESSION: Patient is a 74 y.o. female who was seen today for physical therapy treatment for urinary urgency and incontinence. Patient is no longer experiencing urinary leakage on a daily basis and has had no instances of leakage since last visit. Patient required consistent verbal cueing to perform diaphragmatic breathing correctly with all of today's exercise progressions. Patient reports full understanding of urge drill for urinary urgency at home and tolerated all of today's treatments well; no leakage or increase in pain following today's session. Pt would benefit from additional PT to further address lack of coordination and pelvic floor weakness.   OBJECTIVE IMPAIRMENTS: decreased coordination, decreased endurance, decreased ROM, and decreased strength.   ACTIVITY LIMITATIONS: continence  PARTICIPATION LIMITATIONS:  none  PERSONAL FACTORS: 1-2 comorbidities: IBS, osteopenia   are also affecting patient's functional outcome.   REHAB POTENTIAL: Good  CLINICAL DECISION MAKING: Stable/uncomplicated  EVALUATION  COMPLEXITY: Low   GOALS: Goals reviewed with patient? Yes  SHORT TERM GOALS: Target date: 01/06/2024  Pt will be independent with HEP.  Baseline: Goal status: INITIAL  2.  Pt will be able to teach back and utilize urge suppression technique in order to help reduce number of trips to the bathroom.   Baseline:  Goal status: INITIAL  3.  Pt will be able to go 2-3 hours in between voids without urgency or incontinence in order to improve QOL and perform all functional activities with less difficulty.   Baseline: 1-2 hours  Goal status: INITIAL   LONG TERM GOALS: Target date: 01/06/2024  Pt will be independent with advanced HEP.  Baseline:  Goal status: INITIAL  2.  Pt to demonstrate improved coordination of pelvic floor and breathing mechanics with 10# squat with appropriate synergistic patterns to decrease pain and leakage at least 75% of the time.   Baseline:  Goal status: INITIAL  3.  Pt to report improved time between bladder voids to at least 2 hours for improved QOL with decreased urinary frequency.   Baseline: 1 hour Goal status: INITIAL  4.  Pt to demonstrate at least 4/5 pelvic floor  strength for improved pelvic stability and decreased strain at pelvic floor/ decrease leakage.  Baseline: 3/5 Goal status: INITIAL   PLAN:  PT FREQUENCY: 1x/week  PT DURATION: 8 weeks  PLANNED INTERVENTIONS: 97110-Therapeutic exercises, 97530- Therapeutic activity, 97112- Neuromuscular re-education, 97535- Self Care, 16109- Manual therapy, [phone]- Aquatic Therapy, [phone]- Ultrasound, Patient/Family education, Taping, Dry Needling, Joint mobilization, Spinal manipulation, Scar mobilization, Cryotherapy, and Moist heat  PLAN FOR NEXT SESSION: progress sit to stand to a squat with weight?, sidelying hip abduction, fire hydrants, hip extension machine   Earna Coder, PT, DPT 12/30/23 4:08 PM

## 2024-01-02 ENCOUNTER — Ambulatory Visit: Payer: Medicare Other | Admitting: Internal Medicine

## 2024-01-02 ENCOUNTER — Encounter: Payer: Self-pay | Admitting: Internal Medicine

## 2024-01-02 VITALS — BP 124/76 | HR 95 | Ht 60.5 in | Wt 142.0 lb

## 2024-01-02 DIAGNOSIS — E042 Nontoxic multinodular goiter: Secondary | ICD-10-CM

## 2024-01-02 DIAGNOSIS — M810 Age-related osteoporosis without current pathological fracture: Secondary | ICD-10-CM

## 2024-01-02 NOTE — Progress Notes (Signed)
 Name: Holly Russell  MRN/ DOB: [id number], 1951/04/26    Age/ Sex: 73 y.o., female    PCP: Jeani Sow, MD   Reason for Endocrinology Evaluation: Osteoporosis     Date of Initial Endocrinology Evaluation: 07/04/2023    HPI: Ms. Holly Russell is a 73 y.o. female with a past medical history of HTN and GERD, dyslipidemia. The patient presented for initial endocrinology clinic visit on 07/04/2023 for consultative assistance with her osteoporosis.   Pt was diagnosed with osteoporosis: 04/2022 with a T-score of -2.7 at the AP spine   Menarche at age : 53 Menopausal at age :  ~58 Fracture Hx: arm fracture as a kid , right foot fracture by missing a step  Hx of HRT: no FH of osteoporosis or hip fracture: mother with hip fracture  Prior Hx of anti-resorptive therapy : Received first zoledronic acid injection 08/06/2023  THYROID HISTORY:  She had an incidental finding of a left 2.2 cm thyroid nodule on CT imaging of the chest during evaluation for lung nodules. Thyroid ultrasound showed multiple thyroid nodules, 2 nodules meeting FNA criteria.  She is s/p FNA of the right inferior and left inferior nodules 12/2023 with a cytology report consistent with atypia of undetermined significance (Bethesda category III)  SUBJECTIVE:    Today (01/02/24):  Holly Russell is here for follow-up on osteoporosis.  She follows with ENT for sensorineural hearing loss Denies local neck swelling  Has noted weight gain  Has occasional voice hoarseness  Has occasional  heartburn  She has occasional constipation with calcium intake  but no diarrhea  She has right knee pain in addition to other shin pain in the past  No rash with reclast  infusion    Calcium/Vit D- 600 mg 1 daily  She tries to consume calcium through food as well  She walks as a form of exercise       HISTORY:  Past Medical History:  Past Medical History:  Diagnosis Date   Acid reflux    Agatston CAC score 100-199     Basal cell carcinoma    Cataract    Coronary atherosclerosis    Deviated nasal septum    Eczema    Gastroesophageal reflux disease    H/O bone density study 2019-2015   H/O echocardiogram 2015   H/O exercise stress test 3/4-5 2015   Hearing loss    High blood pressure    High cholesterol    History of chest pain    History of prediabetes    Hx of mammogram 2018-2017-2016-2015-2014   Hyperlipidemia    IBS (irritable bowel syndrome)    Intervertebral disc disease    Iron deficiency anemia    On statin therapy    Osteoarthritis    Osteopenia    unspecified  Location   SNHL (sensorineural hearing loss)    Spinal stenosis    Tinnitus of left ear    Vitamin D deficiency    Past Surgical History:  Past Surgical History:  Procedure Laterality Date   ENDOMETRIAL BIOPSY     EYE SURGERY     lazy eye when 73yo   MOHS SURGERY     for basal cell to the right of right eye   TONSILLECTOMY      Social History:  reports that she has never smoked. She has never used smokeless tobacco. She reports current alcohol use of about 3.0 standard drinks of alcohol per week. She reports that she  does not use drugs. Family History: family history includes Basal cell carcinoma in her father and mother; Breast cancer in her cousin and paternal aunt; Cancer in her maternal grandfather and paternal aunt; Depression in her paternal grandmother; Heart attack in her paternal grandfather; Heart disease in her paternal grandfather; Hypertension in her father and mother; Stroke (age of onset: 53) in her mother; Varicose Veins in her father.   HOME MEDICATIONS: Allergies as of 01/02/2024   No Known Allergies      Medication List        Accurate as of January 02, 2024 10:54 AM. If you have any questions, ask your nurse or doctor.          aspirin EC 81 MG tablet Take 81 mg by mouth every Monday, Wednesday, and Friday. Swallow whole.   atorvastatin 40 MG tablet Commonly known as: LIPITOR Take 1  tablet (40 mg total) by mouth daily.   CALTRATE 600+D3 PO Take 600 mLs by mouth daily. 1-2 tablets daily   cyanocobalamin 1000 MCG tablet Commonly known as: VITAMIN B12 Take 1,000 mcg by mouth as needed.   famotidine 20 MG tablet Commonly known as: PEPCID Take 1 tablet (20 mg total) by mouth daily.   lisinopril 10 MG tablet Commonly known as: ZESTRIL Take 1 tablet (10 mg total) by mouth daily.   zoledronic acid 5 MG/100ML Soln injection Commonly known as: RECLAST          REVIEW OF SYSTEMS: A comprehensive ROS was conducted with the patient and is negative except as per HPI    OBJECTIVE:  VS: BP 124/76 (BP Location: Left Arm, Patient Position: Sitting, Cuff Size: Normal)   Pulse 95   Ht 5' 0.5" (1.537 m)   Wt 142 lb (64.4 kg)   SpO2 98%   BMI 27.28 kg/m    Wt Readings from Last 3 Encounters:  01/02/24 142 lb (64.4 kg)  11/17/23 138 lb 6.4 oz (62.8 kg)  11/03/23 138 lb (62.6 kg)     EXAM: General: Pt appears well and is in NAD  Neck: General: Supple without adenopathy. Thyroid: Thyroid size normal.  No goiter or nodules appreciated. No thyroid bruit.  Lungs: Clear with good BS bilat   Heart: Auscultation: RRR.  Abdomen: Soft, nontender  Extremities:  BL LE: No pretibial edema   Mental Status: Judgment, insight: Intact Orientation: Oriented to time, place, and person Mood and affect: No depression, anxiety, or agitation     DATA REVIEWED:  Latest Reference Range & Units 11/17/23 09:35  Sodium 135 - 145 mEq/L 140  Potassium 3.5 - 5.1 mEq/L 4.3  Chloride 96 - 112 mEq/L 107  CO2 19 - 32 mEq/L 26  Glucose 70 - 99 mg/dL 90  BUN 6 - 23 mg/dL 16  Creatinine 1.61 - 0.96 mg/dL 0.45  Calcium 8.4 - 40.9 mg/dL 9.1  Alkaline Phosphatase 39 - 117 U/L 48  Albumin 3.5 - 5.2 g/dL 4.2  AST 0 - 37 U/L 17  ALT 0 - 35 U/L 21  Total Protein 6.0 - 8.3 g/dL 6.8  Total Bilirubin 0.2 - 1.2 mg/dL 0.6  GFR >81.19 mL/min 78.72      Thyroid ultrasound  12/11/2023   Estimated total number of nodules >/= 1 cm: 2   Number of spongiform nodules >/=  2 cm not described below (TR1): 0   Number of mixed cystic and solid nodules >/= 1.5 cm not described below (TR2): 0   _________________________________________________________   Nodule #  1:   Location: Right; inferior   Maximum size: 2.9 cm; Other 2 dimensions: 2.3 x 2.9 cm   Composition: solid/almost completely solid (2)   Echogenicity: isoechoic (1)   Shape: taller-than-wide (3)   Margins: smooth (0)   Echogenic foci: none (0)   ACR TI-RADS total points: 6.   ACR TI-RADS risk category: TR4 (4-6 points).   ACR TI-RADS recommendations:   **Given size (>/= 1.5 cm) and appearance, fine needle aspiration of this moderately suspicious nodule should be considered based on TI-RADS criteria.   _________________________________________________________   Nodule # 2:   Location: Left; inferior   Maximum size: 3.7 cm; Other 2 dimensions: 2.5 x 3.1 cm   Composition: solid/almost completely solid (2)   Echogenicity: isoechoic (1)   Shape: not taller-than-wide (0)   Margins: smooth (0)   Echogenic foci: none (0)   ACR TI-RADS total points: 3.   ACR TI-RADS risk category: TR3 (3 points).   ACR TI-RADS recommendations:   **Given size (>/= 2.5 cm) and appearance, fine needle aspiration of this mildly suspicious nodule should be considered based on TI-RADS criteria.   This nodule corresponds to the abnormality seen on recent chest CT.   IMPRESSION: Bilateral solid isoechoic thyroid nodules both of which meet criteria for FNA.  FNA of right inferior nodule 12/23/2023    Clinical History: Nodule #1: Right; Inferior, Maximum size: 2.9 cm; Other 2 dimensions: 2.3 x 2.9 cm, solid/almost completely solid, isoechoic, taller-than-wide, TI-RADS total points: 6. Specimen Submitted:  A. THYROID, RT INFERIOR, FINE NEEDLE ASPIRATION   FINAL MICROSCOPIC DIAGNOSIS: -  Follicular lesion of undetermined significance (Bethesda category III)   FNA left inferior nodule 12/23/2023    Clinical History: Nodule #2: Left; Inferior, Maximum size: 3.7 cm; Other 2 dimensions: 2.5 x 3.1 cm, solid/almost completely solid, isoechoic, TI-RADS total points: 3. Specimen Submitted:  A. THYROID, LT INFERIOR, FINE NEEDLE ASPIRATION   FINAL MICROSCOPIC DIAGNOSIS: - Follicular lesion of undetermined significance (Bethesda category III)   Afirma Pending    DXA 04/05/2022 The BMD measured at [facility] L1-L4 is 0.857 g/cm2 with a T-score of -2.7. This patient is considered osteoporotic according to World Health Organization [facility]) criteria.   The quality of the exam is good.   Site Region Measured Date Measured Age YA BMD Significant CHANGE T-score [facility] Spine L1-L4 04/05/2022 72.2 -2.7 0.857 g/cm2   DualFemur Neck Left 04/05/2022 72.2 -2.0 0.767 g/cm2   DualFemur Total Mean 04/05/2022 72.2 -1.5 0.814 g/cm2   Right Forearm Radius 33% 04/05/2022 72.2 -1.6 0.737 g/cm2  Old records , labs and images have been reviewed.    ASSESSMENT/PLAN/RECOMMENDATIONS:   Osteoporosis:   -We emphasized the importance of optimizing calcium and vitamin D intake -We also emphasized the importance of weightbearing exercises -Due to GERD patient would not be a candidate for oral bisphosphonate therapy -She received zoledronic acid 08/2023 without side effects -We discussed considering a drug holiday at the 3-year mark of zoledronic acid -Most recent labs reviewed  Medications : Zoledronic acid 5 mg IV annually   2.  Multinodular goiter:  -No local neck symptoms -TFTs normal in the past -S/p FNA of the right and left inferior nodules with atypia of undetermined significance (Bethesda categoryIII) -Afirma pending, this has been ordered through PCPs office -We discussed the importance of annual thyroid ultrasounds if Afirma is benign for up to 5 years -If Afirma is suspicious,  patient will need surgical intervention   Follow-up in 6 months    Signed  electronically by: Lyndle Herrlich, MD  [facility] Endocrinology  [facility] [address] [place], [place] [address] Phone: [phone] FAX: [phone]   CC: Jeani Sow, MD [address] [place] [address] Phone: [phone] Fax: [phone]   Return to Endocrinology clinic as below: Future Appointments  Date Time Provider Department Center  01/07/2024  9:30 AM Earna Coder C, PT [facility] None  01/14/2024  9:30 AM Earna Coder C, PT [facility] None  01/21/2024  9:30 AM Omar Person, PT [facility] None  01/28/2024  9:30 AM May, Deanna J, NP [facility]  02/12/2024  3:40 PM Little Ishikawa, MD [facility] None  04/20/2024  3:30 PM [facility] ANNUAL WELLNESS VISIT 1 [facility]  05/17/2024  9:30 AM Jeani Sow, MD [facility]

## 2024-01-02 NOTE — Patient Instructions (Signed)
Calcium 1200mg daily

## 2024-01-07 ENCOUNTER — Ambulatory Visit: Payer: Medicare Other | Attending: Obstetrics and Gynecology | Admitting: Physical Therapy

## 2024-01-07 DIAGNOSIS — R278 Other lack of coordination: Secondary | ICD-10-CM | POA: Diagnosis present

## 2024-01-07 DIAGNOSIS — M6281 Muscle weakness (generalized): Secondary | ICD-10-CM | POA: Insufficient documentation

## 2024-01-07 NOTE — Therapy (Signed)
 OUTPATIENT PHYSICAL THERAPY FEMALE PELVIC TREATMENT   Patient Name: Holly Russell MRN: [id number] DOB:1951/05/15, 73 y.o., female Today's Date: 01/07/2024  END OF SESSION:  PT End of Session - 01/07/24 1013     Visit Number 4    Authorization Type Medicare/BCBS    PT Start Time 0929    PT Stop Time 1011    PT Time Calculation (min) 42 min    Activity Tolerance Patient tolerated treatment well    Behavior During Therapy [facility] for tasks assessed/performed                Past Medical History:  Diagnosis Date   Acid reflux    Agatston CAC score 100-199    Basal cell carcinoma    Cataract    Coronary atherosclerosis    Deviated nasal septum    Eczema    Gastroesophageal reflux disease    H/O bone density study 2019-2015   H/O echocardiogram 2015   H/O exercise stress test 3/4-5 2015   Hearing loss    High blood pressure    High cholesterol    History of chest pain    History of prediabetes    Hx of mammogram 2018-2017-2016-2015-2014   Hyperlipidemia    IBS (irritable bowel syndrome)    Intervertebral disc disease    Iron deficiency anemia    On statin therapy    Osteoarthritis    Osteopenia    unspecified  Location   SNHL (sensorineural hearing loss)    Spinal stenosis    Tinnitus of left ear    Vitamin D deficiency    Past Surgical History:  Procedure Laterality Date   ENDOMETRIAL BIOPSY     EYE SURGERY     lazy eye when 73yo   MOHS SURGERY     for basal cell to the right of right eye   TONSILLECTOMY     Patient Active Problem List   Diagnosis Date Noted   Well woman exam with routine gynecological exam 11/03/2023   Urinary incontinence 11/03/2023   Sensorineural hearing loss, bilateral 09/13/2023   Prediabetes 05/09/2023   B12 deficiency 05/09/2023   Lung nodules 05/09/2023   Age-related osteoporosis without current pathological fracture 09/04/2022   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 02/19/2021   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 02/19/2021   Urethral  caruncle 02/19/2021   Microscopic hematuria 02/07/2021   Essential (primary) hypertension 09/15/2020   Pure hypercholesterolemia 09/15/2020   Spinal stenosis 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 09/15/2020   Gastroesophageal reflux disease with esophagitis without hemorrhage 09/15/2020   Presbycusis of both ears 09/15/2020   Tinnitus of both ears 09/15/2020   Estrogen deficiency 09/15/2020   Age-related cataract of both eyes 09/15/2020   Macular pucker, bilateral 09/15/2020    PCP: Jeani Sow, MD  REFERRING PROVIDER: Rosalyn Gess, MD  REFERRING DIAG: R32 (ICD-10-CM) - Urinary incontinence, unspecified type  THERAPY DIAG:  Muscle weakness (generalized)  Other lack of coordination  Rationale for Evaluation and Treatment: Rehabilitation  ONSET DATE: several years ago   SUBJECTIVE:  SUBJECTIVE STATEMENT: Patient reports that she is doing well, she has had a good week. She reports no leakage or accidents this week. She reports that she has a better understanding of how to balance her calcium supplements for her osteoporosis with her bowel routine to prevent constipation. Patient has gotten better about voiding in 2-3 hour increments. Patient has been walking consistently every day for 3 miles. She plans to start adding weights to routine at home for extra loading for osteoporosis.   Fluid intake: Yes: drinks water, half-caff coffee 1-3x/day    PAIN:  Are you having pain? No NPRS scale: 0/10  PRECAUTIONS: None  RED FLAGS: None   WEIGHT BEARING RESTRICTIONS: No  FALLS:  Has patient fallen in last 6 months? No  LIVING ENVIRONMENT: Lives with: lives with their family Lives in: House/apartment  OCCUPATION: retired   PLOF: Independent with basic ADLs  PATIENT GOALS:  gain control of urinary function to decrease disturbances with travel, it causes anxiety  PERTINENT HISTORY:  Diagnosis Date Comment Source  Acid reflux     Agatston CAC score 100-199     Basal cell carcinoma     Cataract     Coronary atherosclerosis     Deviated nasal septum     Eczema     Gastroesophageal reflux disease     H/O bone density study 2019-2015    H/O echocardiogram 2015    H/O exercise stress test 3/4-5 2015    Hearing loss     High blood pressure     High cholesterol     History of chest pain     History of prediabetes     Hx of mammogram 2018-2017-2016-2015-2014    Hyperlipidemia     IBS (irritable bowel syndrome)     Intervertebral disc disease     Iron deficiency anemia     On statin therapy     Osteoarthritis     Osteopenia  unspecified  Location   SNHL (sensorineural hearing loss)     Spinal stenosis     Tinnitus of left ear     Vitamin D deficiency      Sexual abuse: No  BOWEL MOVEMENT: Pain with bowel movement: No Type of bowel movement:Type (Bristol Stool Scale) BRISTOL STOOL SCALE type 1-4 depending on water intake, Frequency 1x/day, Strain No, and Splinting no Fully empty rectum: Yes:   Leakage: No Pads: No  URINATION: Pain with urination: No Fully empty bladder: No Stream: Strong Urgency: Yes: but does not cause incontinence  Frequency: 5-6x/day  Leakage: no regular leakage, but have had instances of leakage in the past  Pads: Yes: if she knows she will be away from a bathroom for a while, but has never experienced wetness in pad   INTERCOURSE: Not sexually active   PREGNANCY: Vaginal deliveries 2  PROLAPSE: None   OBJECTIVE:  Note: Objective measures were completed at Evaluation unless otherwise noted.  PATIENT SURVEYS:  PFIQ-7: 29  COGNITION: Overall cognitive status: Within functional limits for tasks assessed   SENSATION: Light touch: Appears intact Proprioception: Appears intact  FUNCTIONAL TESTS:  Squat:  WNL  GAIT: Mild trendelenburg gait pattern with ambulation   POSTURE: rounded shoulders and forward head  PELVIC ALIGNMENT: WNL  LUMBARAROM/PROM:  A/PROM A/PROM  eval  Flexion 25% limited  Extension 25% limited  Right lateral flexion 25% limited  Left lateral flexion 25% limited  Right rotation   Left rotation    (Blank rows = not tested)  PALPATION:  General: patient not tender to palpation along bilateral adductors/pubic bone/lower abdominals/hip flexors                 External Perineal Exam: noted vaginal dryness                             Internal Pelvic Floor: general weakness throughout, no pain with palpation of internal pelvic floor musculature   Patient confirms identification and approves PT to assess internal pelvic floor and treatment Yes No emotional/communication barriers or cognitive limitation. Patient is motivated to learn. Patient understands and agrees with treatment goals and plan. PT explains patient will be examined in standing, sitting, and lying down to see how their muscles and joints work. When they are ready, they will be asked to remove their underwear so PT can examine their perineum. The patient is also given the option of providing their own chaperone as one is not provided in our facility. The patient also has the right and is explained the right to defer or refuse any part of the evaluation or treatment including the internal exam. With the patient's consent, PT will use one gloved finger to gently assess the muscles of the pelvic floor, seeing how well it contracts and relaxes and if there is muscle symmetry. After, the patient will get dressed and PT and patient will discuss exam findings and plan of care. PT and patient discuss plan of care, schedule, attendance policy and HEP activities.  PELVIC MMT:   MMT eval  Vaginal 3/5, 2 second isometric, 3 quick flicks   Internal Anal Sphincter   External Anal Sphincter   Puborectalis   Diastasis  Recti   (Blank rows = not tested)        TONE: General decrease in muscle tone throughout bilateral pelvic floor musculature   PROLAPSE: N/A  TODAY'S TREATMENT:                                                                                                                              DATE:  12/17/23: Neuromotor re-education:   Seated diaphragmatic breathing with pelvic floor lengthening on inhalation and pelvic floor shortening on exhalation 2x10 breaths  Urge suppression drill for urinary urgency and incontinence  Seated quick flicks pelvic contractions 2x10  Hooklying adductor ball squeeze with diaphragmatic breathing 2x31min  Therapeutic activity  pelvic floor AROM, vaginal moisturizers, connection between diaphragm and pelvic floor musculature, urge suppression drill Sit to stand with exhalation 2x5  Bridge + exhale 2x10  Open books + diaphragmatic breathing 2x10  12/30/23: Neuromotor re-education:   Sidelying clamshell + reverse clamshell + diaphragmatic breathing 2x10  Sit to stand + hip abduction (green theraband) + diaphragmatic breathing 2x10  Supine march with diaphragmatic breathing 2x20 alternating sides  Seated diaphragmatic breathing + pelvic floor AROM with breathing 2x10  Therapeutic activity  pelvic floor AROM, vaginal moisturizers, connection between diaphragm and pelvic floor  musculature, urge suppression drill  01/07/24: Neuromotor re-education:   Sit to stand + 5# dumbbell + diaphragmatic breathing 2x10  Seated diaphragmatic breathing + pelvic floor AROM with breathing 2x10  Step up forward/lateral with 5# weight + diaphragmatic breathing 2x10 each  Goblet squat/sit to stand with 5# weight + diaphragmatic breathing 2x10  Standing march with opposite shoulder press (1# weights) + diaphragmatic breathing 2x10  Therapeutic activity  Treadmill walking for loading/blood flow speed 2.0, 6 minutes  pelvic floor AROM, vaginal moisturizers, connection between  diaphragm and pelvic floor musculature, urge suppression drill  PATIENT EDUCATION:  Education details: pelvic floor AROM, vaginal moisturizers, connection between diaphragm and pelvic floor musculature, urge suppression drill Person educated: Patient Education method: Explanation, Demonstration, Tactile cues, Verbal cues, and Handouts Education comprehension: verbalized understanding, returned demonstration, verbal cues required, and tactile cues required  HOME EXERCISE PROGRAM: Access Code: [facility] URL: [email or web address] Date: 01/07/2024 Prepared by: Earna Coder  Exercises - Seated Pelvic Floor Contraction  - 1 x daily - 7 x weekly - 2 sets - 10 reps - Standing March with Alternating [facility]  - 1 x daily - 7 x weekly - 2 sets - 10 reps - Step Up  - 1 x daily - 7 x weekly - 2 sets - 10 reps - Lateral Step Up  - 1 x daily - 7 x weekly - 2 sets - 10 reps - Wall Squat  - 1 x daily - 7 x weekly - 2 sets - 30 sec-85min hold - Goblet Squat with Kettlebell  - 1 x daily - 7 x weekly - 2 sets - 10 reps  ASSESSMENT:  CLINICAL IMPRESSION: Patient is a 73 y.o. female who was seen today for physical therapy treatment for urinary urgency and incontinence. Patient is no longer experiencing urinary leakage on a daily basis and has had no instances of leakage since last visit. She wishes to add more loaded pelvic floor exercises to regimen to assist with loading the bones for management of osteoporosis. Patient required consistent verbal cueing to perform diaphragmatic breathing correctly with all of today's exercise progressions. Patient's right knee was sore today due to arthritis, but she tolerated all of today's treatments well; no leakage or increase in pain following today's session. Pt would benefit from additional PT to further address lack of coordination and pelvic floor weakness.   OBJECTIVE IMPAIRMENTS: decreased coordination, decreased endurance, decreased ROM, and  decreased strength.   ACTIVITY LIMITATIONS: continence  PARTICIPATION LIMITATIONS:  none  PERSONAL FACTORS: 1-2 comorbidities: IBS, osteopenia   are also affecting patient's functional outcome.   REHAB POTENTIAL: Good  CLINICAL DECISION MAKING: Stable/uncomplicated  EVALUATION COMPLEXITY: Low   GOALS: Goals reviewed with patient? Yes  SHORT TERM GOALS: Target date: 01/06/2024  Pt will be independent with HEP.  Baseline: Goal status: INITIAL  2.  Pt will be able to teach back and utilize urge suppression technique in order to help reduce number of trips to the bathroom.   Baseline:  Goal status: INITIAL  3.  Pt will be able to go 2-3 hours in between voids without urgency or incontinence in order to improve QOL and perform all functional activities with less difficulty.   Baseline: 1-2 hours  Goal status: INITIAL   LONG TERM GOALS: Target date: 01/06/2024  Pt will be independent with advanced HEP.  Baseline:  Goal status: INITIAL  2.  Pt to demonstrate improved coordination of pelvic floor and breathing mechanics with 10# squat with appropriate synergistic  patterns to decrease pain and leakage at least 75% of the time.   Baseline:  Goal status: INITIAL  3.  Pt to report improved time between bladder voids to at least 2 hours for improved QOL with decreased urinary frequency.   Baseline: 1 hour Goal status: INITIAL  4.  Pt to demonstrate at least 4/5 pelvic floor strength for improved pelvic stability and decreased strain at pelvic floor/ decrease leakage.  Baseline: 3/5 Goal status: INITIAL   PLAN:  PT FREQUENCY: 1x/week  PT DURATION: 8 weeks  PLANNED INTERVENTIONS: 97110-Therapeutic exercises, 97530- Therapeutic activity, 97112- Neuromuscular re-education, 97535- Self Care, 16109- Manual therapy, [phone]- Aquatic Therapy, [phone]- Ultrasound, Patient/Family education, Taping, Dry Needling, Joint mobilization, Spinal manipulation, Scar mobilization, Cryotherapy, and  Moist heat  PLAN FOR NEXT SESSION: progress sit to stand to a squat with weight?, sidelying hip abduction, fire hydrants, hip extension machine   Earna Coder, PT, DPT 01/07/24 10:14 AM

## 2024-01-09 ENCOUNTER — Encounter (HOSPITAL_COMMUNITY): Payer: Self-pay

## 2024-01-14 ENCOUNTER — Ambulatory Visit: Payer: Medicare Other | Admitting: Physical Therapy

## 2024-01-14 DIAGNOSIS — R278 Other lack of coordination: Secondary | ICD-10-CM

## 2024-01-14 DIAGNOSIS — M6281 Muscle weakness (generalized): Secondary | ICD-10-CM

## 2024-01-14 NOTE — Therapy (Signed)
 OUTPATIENT PHYSICAL THERAPY FEMALE PELVIC TREATMENT   Patient Name: Holly Russell MRN: [id number] DOB:1951-07-24, 73 y.o., female Today's Date: 01/14/2024  END OF SESSION:  PT End of Session - 01/14/24 1007     Visit Number 5    Authorization Type Medicare/BCBS    PT Start Time 0928    PT Stop Time 1008    PT Time Calculation (min) 40 min    Activity Tolerance Patient tolerated treatment well    Behavior During Therapy [facility] for tasks assessed/performed                 Past Medical History:  Diagnosis Date   Acid reflux    Agatston CAC score 100-199    Basal cell carcinoma    Cataract    Coronary atherosclerosis    Deviated nasal septum    Eczema    Gastroesophageal reflux disease    H/O bone density study 2019-2015   H/O echocardiogram 2015   H/O exercise stress test 3/4-5 2015   Hearing loss    High blood pressure    High cholesterol    History of chest pain    History of prediabetes    Hx of mammogram 2018-2017-2016-2015-2014   Hyperlipidemia    IBS (irritable bowel syndrome)    Intervertebral disc disease    Iron deficiency anemia    On statin therapy    Osteoarthritis    Osteopenia    unspecified  Location   SNHL (sensorineural hearing loss)    Spinal stenosis    Tinnitus of left ear    Vitamin D deficiency    Past Surgical History:  Procedure Laterality Date   ENDOMETRIAL BIOPSY     EYE SURGERY     lazy eye when 73yo   MOHS SURGERY     for basal cell to the right of right eye   TONSILLECTOMY     Patient Active Problem List   Diagnosis Date Noted   Well woman exam with routine gynecological exam 11/03/2023   Urinary incontinence 11/03/2023   Sensorineural hearing loss, bilateral 09/13/2023   Prediabetes 05/09/2023   B12 deficiency 05/09/2023   Lung nodules 05/09/2023   Age-related osteoporosis without current pathological fracture 09/04/2022   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 02/19/2021   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 02/19/2021    Urethral caruncle 02/19/2021   Microscopic hematuria 02/07/2021   Essential (primary) hypertension 09/15/2020   Pure hypercholesterolemia 09/15/2020   Spinal stenosis 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 09/15/2020   Gastroesophageal reflux disease with esophagitis without hemorrhage 09/15/2020   Presbycusis of both ears 09/15/2020   Tinnitus of both ears 09/15/2020   Estrogen deficiency 09/15/2020   Age-related cataract of both eyes 09/15/2020   Macular pucker, bilateral 09/15/2020    PCP: Jeani Sow, MD  REFERRING PROVIDER: Rosalyn Gess, MD  REFERRING DIAG: R32 (ICD-10-CM) - Urinary incontinence, unspecified type  THERAPY DIAG:  Other lack of coordination  Muscle weakness (generalized)  Rationale for Evaluation and Treatment: Rehabilitation  ONSET DATE: several years ago   SUBJECTIVE:  SUBJECTIVE STATEMENT: Patient reports that she is doing fine today, she had the most trouble with the exercise that involved lateral stepping. No leakage or accidents this week. Patient reports that she hasn't been getting up nightly to void. Patient has gotten better about voiding in 2-3 hour increments. Patient has been walking consistently every day for 3 miles. She plans to start adding weights to routine at home for extra loading for osteoporosis.   Fluid intake: Yes: drinks water, half-caff coffee 1-3x/day    PAIN:  Are you having pain? No NPRS scale: 0/10  PRECAUTIONS: None  RED FLAGS: None   WEIGHT BEARING RESTRICTIONS: No  FALLS:  Has patient fallen in last 6 months? No  LIVING ENVIRONMENT: Lives with: lives with their family Lives in: House/apartment  OCCUPATION: retired   PLOF: Independent with basic ADLs  PATIENT GOALS: gain control of urinary function to  decrease disturbances with travel, it causes anxiety  PERTINENT HISTORY:  Diagnosis Date Comment Source  Acid reflux     Agatston CAC score 100-199     Basal cell carcinoma     Cataract     Coronary atherosclerosis     Deviated nasal septum     Eczema     Gastroesophageal reflux disease     H/O bone density study 2019-2015    H/O echocardiogram 2015    H/O exercise stress test 3/4-5 2015    Hearing loss     High blood pressure     High cholesterol     History of chest pain     History of prediabetes     Hx of mammogram 2018-2017-2016-2015-2014    Hyperlipidemia     IBS (irritable bowel syndrome)     Intervertebral disc disease     Iron deficiency anemia     On statin therapy     Osteoarthritis     Osteopenia  unspecified  Location   SNHL (sensorineural hearing loss)     Spinal stenosis     Tinnitus of left ear     Vitamin D deficiency      Sexual abuse: No  BOWEL MOVEMENT: Pain with bowel movement: No Type of bowel movement:Type (Bristol Stool Scale) BRISTOL STOOL SCALE type 1-4 depending on water intake, Frequency 1x/day, Strain No, and Splinting no Fully empty rectum: Yes:   Leakage: No Pads: No  URINATION: Pain with urination: No Fully empty bladder: No Stream: Strong Urgency: Yes: but does not cause incontinence  Frequency: 5-6x/day  Leakage: no regular leakage, but have had instances of leakage in the past  Pads: Yes: if she knows she will be away from a bathroom for a while, but has never experienced wetness in pad   INTERCOURSE: Not sexually active   PREGNANCY: Vaginal deliveries 2  PROLAPSE: None   OBJECTIVE:  Note: Objective measures were completed at Evaluation unless otherwise noted.  PATIENT SURVEYS:  PFIQ-7: 29  COGNITION: Overall cognitive status: Within functional limits for tasks assessed   SENSATION: Light touch: Appears intact Proprioception: Appears intact  FUNCTIONAL TESTS:  Squat: WNL  GAIT: Mild trendelenburg gait  pattern with ambulation   POSTURE: rounded shoulders and forward head  PELVIC ALIGNMENT: WNL  LUMBARAROM/PROM:  A/PROM A/PROM  eval  Flexion 25% limited  Extension 25% limited  Right lateral flexion 25% limited  Left lateral flexion 25% limited  Right rotation   Left rotation    (Blank rows = not tested)  PALPATION:   General: patient not tender to palpation along bilateral  adductors/pubic bone/lower abdominals/hip flexors                 External Perineal Exam: noted vaginal dryness                             Internal Pelvic Floor: general weakness throughout, no pain with palpation of internal pelvic floor musculature   Patient confirms identification and approves PT to assess internal pelvic floor and treatment Yes No emotional/communication barriers or cognitive limitation. Patient is motivated to learn. Patient understands and agrees with treatment goals and plan. PT explains patient will be examined in standing, sitting, and lying down to see how their muscles and joints work. When they are ready, they will be asked to remove their underwear so PT can examine their perineum. The patient is also given the option of providing their own chaperone as one is not provided in our facility. The patient also has the right and is explained the right to defer or refuse any part of the evaluation or treatment including the internal exam. With the patient's consent, PT will use one gloved finger to gently assess the muscles of the pelvic floor, seeing how well it contracts and relaxes and if there is muscle symmetry. After, the patient will get dressed and PT and patient will discuss exam findings and plan of care. PT and patient discuss plan of care, schedule, attendance policy and HEP activities.  PELVIC MMT:   MMT eval  Vaginal 3/5, 2 second isometric, 3 quick flicks   Internal Anal Sphincter   External Anal Sphincter   Puborectalis   Diastasis Recti   (Blank rows = not tested)         TONE: General decrease in muscle tone throughout bilateral pelvic floor musculature   PROLAPSE: N/A  TODAY'S TREATMENT:                                                                                                                              DATE:  12/30/23: Neuromotor re-education:   Sidelying clamshell + reverse clamshell + diaphragmatic breathing 2x10  Sit to stand + hip abduction (green theraband) + diaphragmatic breathing 2x10  Supine march with diaphragmatic breathing 2x20 alternating sides  Seated diaphragmatic breathing + pelvic floor AROM with breathing 2x10  Therapeutic activity  pelvic floor AROM, vaginal moisturizers, connection between diaphragm and pelvic floor musculature, urge suppression drill  01/07/24: Neuromotor re-education:   Sit to stand + 5# dumbbell + diaphragmatic breathing 2x10  Seated diaphragmatic breathing + pelvic floor AROM with breathing 2x10  Step up forward/lateral with 5# weight + diaphragmatic breathing 2x10 each  Goblet squat/sit to stand with 5# weight + diaphragmatic breathing 2x10  Standing march with opposite shoulder press (1# weights) + diaphragmatic breathing 2x10  Therapeutic activity  Treadmill walking for loading/blood flow speed 2.0, 6 minutes  pelvic floor AROM, vaginal moisturizers, connection between diaphragm and pelvic floor  musculature, urge suppression drill  01/13/24: Neuromotor re-education:   squat + 5# dumbbell + diaphragmatic breathing 2x8  Step up forward/lateral with 5# weight + diaphragmatic breathing 2x10 each  Standing hip abduction (2# ankle weights) + diaphragmatic breathing 2x10 each side  Bird dog + diaphragmatic breathing 2x10 alternating  Standing march with opposite shoulder press (2# weights) + diaphragmatic breathing 2x10  Bird dog at counter + diaphragmatic breathing 2x10 alternating  Therapeutic activity  Treadmill walking for loading/blood flow speed 2.0, 6 minutes  pelvic floor AROM, vaginal  moisturizers, connection between diaphragm and pelvic floor musculature, urge suppression drill  PATIENT EDUCATION:  Education details: pelvic floor AROM, vaginal moisturizers, connection between diaphragm and pelvic floor musculature, urge suppression drill Person educated: Patient Education method: Explanation, Demonstration, Tactile cues, Verbal cues, and Handouts Education comprehension: verbalized understanding, returned demonstration, verbal cues required, and tactile cues required  HOME EXERCISE PROGRAM: Access Code: [facility] URL: [email or web address] Date: 01/14/2024 Prepared by: Earna Coder  Exercises - Seated Pelvic Floor Contraction  - 1 x daily - 7 x weekly - 2 sets - 10 reps - Standing March with Alternating [facility]  - 1 x daily - 7 x weekly - 2 sets - 10 reps - Step Up  - 1 x daily - 7 x weekly - 2 sets - 10 reps - Goblet Squat with Kettlebell  - 1 x daily - 7 x weekly - 2 sets - 10 reps - Standing Hip Abduction with Counter Support  - 1 x daily - 7 x weekly - 2 sets - 15 reps - Bird Dog on Counter  - 1 x daily - 7 x weekly - 2 sets - 10 reps  ASSESSMENT:  CLINICAL IMPRESSION: Patient is a 73 y.o. female who was seen today for physical therapy treatment for urinary urgency and incontinence. Patient is no longer experiencing urinary leakage on a daily basis and has had no instances of leakage since last visit. Today, more loaded pelvic floor exercises were added to regimen to assist with loading the bones for management of osteoporosis. Patient required consistent verbal cueing to perform diaphragmatic breathing correctly with all of today's exercise progressions. Patient's right knee was sore today due to arthritis, and she was unable to perform quadruped bird dog and lateral lunge exercise due to this pain. No leakage or increase in pelvic discomfort following today's session. Pt would benefit from additional PT to further address lack of coordination and  pelvic floor weakness.   OBJECTIVE IMPAIRMENTS: decreased coordination, decreased endurance, decreased ROM, and decreased strength.   ACTIVITY LIMITATIONS: continence  PARTICIPATION LIMITATIONS:  none  PERSONAL FACTORS: 1-2 comorbidities: IBS, osteopenia   are also affecting patient's functional outcome.   REHAB POTENTIAL: Good  CLINICAL DECISION MAKING: Stable/uncomplicated  EVALUATION COMPLEXITY: Low   GOALS: Goals reviewed with patient? Yes  SHORT TERM GOALS: Target date: 01/06/2024  Pt will be independent with HEP.  Baseline: Goal status: GOAL MET 01/14/24  2.  Pt will be able to teach back and utilize urge suppression technique in order to help reduce number of trips to the bathroom.   Baseline:  Goal status: GOAL MET 01/14/24  3.  Pt will be able to go 2-3 hours in between voids without urgency or incontinence in order to improve QOL and perform all functional activities with less difficulty.   Baseline: 1-2 hours  Goal status: GOAL MET 01/14/24   LONG TERM GOALS: Target date: 01/06/2024  Pt will be independent with advanced HEP.  Baseline:  Goal status: GOAL MET 01/14/24  2.  Pt to demonstrate improved coordination of pelvic floor and breathing mechanics with 10# squat with appropriate synergistic patterns to decrease pain and leakage at least 75% of the time.   Baseline:  Goal status: INITIAL  3.  Pt to report improved time between bladder voids to at least 2 hours for improved QOL with decreased urinary frequency.   Baseline: 1 hour Goal status: GOAL MET 01/14/24  4.  Pt to demonstrate at least 4/5 pelvic floor strength for improved pelvic stability and decreased strain at pelvic floor/ decrease leakage.  Baseline: 3/5 Goal status: INITIAL   PLAN:  PT FREQUENCY: 1x/week  PT DURATION: 8 weeks  PLANNED INTERVENTIONS: 97110-Therapeutic exercises, 97530- Therapeutic activity, 97112- Neuromuscular re-education, 97535- Self Care, 16109- Manual therapy, [phone]-  Aquatic Therapy, [phone]- Ultrasound, Patient/Family education, Taping, Dry Needling, Joint mobilization, Spinal manipulation, Scar mobilization, Cryotherapy, and Moist heat  PLAN FOR NEXT SESSION: progress HEP accordingly with loaded exercises for osteoporosis management and pelvic floor training   Earna Coder, PT, DPT 01/14/24 10:08 AM

## 2024-01-19 ENCOUNTER — Other Ambulatory Visit: Payer: Medicare Other

## 2024-01-20 ENCOUNTER — Encounter: Payer: Self-pay | Admitting: Internal Medicine

## 2024-01-21 ENCOUNTER — Ambulatory Visit: Payer: Medicare Other | Admitting: Physical Therapy

## 2024-01-21 DIAGNOSIS — M6281 Muscle weakness (generalized): Secondary | ICD-10-CM | POA: Diagnosis not present

## 2024-01-21 DIAGNOSIS — R278 Other lack of coordination: Secondary | ICD-10-CM

## 2024-01-21 NOTE — Therapy (Signed)
 OUTPATIENT PHYSICAL THERAPY FEMALE PELVIC DISCHARGE   Patient Name: Holly Russell MRN: [id number] DOB:02-Jun-1951, 73 y.o., female Today's Date: 01/21/2024  END OF SESSION:  PT End of Session - 01/21/24 1013     Visit Number 6    Number of Visits 6    Authorization Type Medicare/BCBS    PT Start Time 0930    PT Stop Time 1013    PT Time Calculation (min) 43 min    Activity Tolerance Patient tolerated treatment well    Behavior During Therapy WFL for tasks assessed/performed                  Past Medical History:  Diagnosis Date   Acid reflux    Agatston CAC score 100-199    Basal cell carcinoma    Cataract    Coronary atherosclerosis    Deviated nasal septum    Eczema    Gastroesophageal reflux disease    H/O bone density study 2019-2015   H/O echocardiogram 2015   H/O exercise stress test 3/4-5 2015   Hearing loss    High blood pressure    High cholesterol    History of chest pain    History of prediabetes    Hx of mammogram 2018-2017-2016-2015-2014   Hyperlipidemia    IBS (irritable bowel syndrome)    Intervertebral disc disease    Iron deficiency anemia    On statin therapy    Osteoarthritis    Osteopenia    unspecified  Location   SNHL (sensorineural hearing loss)    Spinal stenosis    Tinnitus of left ear    Vitamin D deficiency    Past Surgical History:  Procedure Laterality Date   ENDOMETRIAL BIOPSY     EYE SURGERY     lazy eye when 73yo   MOHS SURGERY     for basal cell to the right of right eye   TONSILLECTOMY     Patient Active Problem List   Diagnosis Date Noted   Well woman exam with routine gynecological exam 11/03/2023   Urinary incontinence 11/03/2023   Sensorineural hearing loss, bilateral 09/13/2023   Prediabetes 05/09/2023   B12 deficiency 05/09/2023   Lung nodules 05/09/2023   Age-related osteoporosis without current pathological fracture 09/04/2022   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 02/19/2021   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 02/19/2021   Urethral caruncle 02/19/2021   Microscopic hematuria 02/07/2021   Essential (primary) hypertension 09/15/2020   Pure hypercholesterolemia 09/15/2020   Spinal stenosis 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 09/15/2020   Gastroesophageal reflux disease with esophagitis without hemorrhage 09/15/2020   Presbycusis of both ears 09/15/2020   Tinnitus of both ears 09/15/2020   Estrogen deficiency 09/15/2020   Age-related cataract of both eyes 09/15/2020   Macular pucker, bilateral 09/15/2020    PCP: Jeani Sow, MD  REFERRING PROVIDER: Rosalyn Gess, MD  REFERRING DIAG: R32 (ICD-10-CM) - Urinary incontinence, unspecified type  THERAPY DIAG:  Other lack of coordination  Muscle weakness (generalized)  Rationale for Evaluation and Treatment: Rehabilitation  ONSET DATE: several years ago   SUBJECTIVE:  SUBJECTIVE STATEMENT: HEP has been going well and patient has been consistent. She has had no leakage or any other bladder issues. She is pleased with her progress thus far and is amenable to discharge today. Urge drill was helpful yesterday and she had success with it.   Fluid intake: Yes: drinks water, half-caff coffee 1-3x/day    PAIN:  Are you having pain? No NPRS scale: 0/10  PRECAUTIONS: None  RED FLAGS: None   WEIGHT BEARING RESTRICTIONS: No  FALLS:  Has patient fallen in last 6 months? No  LIVING ENVIRONMENT: Lives with: lives with their family Lives in: House/apartment  OCCUPATION: retired   PLOF: Independent with basic ADLs  PATIENT GOALS: gain control of urinary function to decrease disturbances with travel, it causes anxiety  PERTINENT HISTORY:  Diagnosis Date Comment Source  Acid reflux     Agatston CAC score 100-199     Basal cell  carcinoma     Cataract     Coronary atherosclerosis     Deviated nasal septum     Eczema     Gastroesophageal reflux disease     H/O bone density study 2019-2015    H/O echocardiogram 2015    H/O exercise stress test 3/4-5 2015    Hearing loss     High blood pressure     High cholesterol     History of chest pain     History of prediabetes     Hx of mammogram 2018-2017-2016-2015-2014    Hyperlipidemia     IBS (irritable bowel syndrome)     Intervertebral disc disease     Iron deficiency anemia     On statin therapy     Osteoarthritis     Osteopenia  unspecified  Location   SNHL (sensorineural hearing loss)     Spinal stenosis     Tinnitus of left ear     Vitamin D deficiency      Sexual abuse: No  BOWEL MOVEMENT: Pain with bowel movement: No Type of bowel movement:Type (Bristol Stool Scale) BRISTOL STOOL SCALE type 1-4 depending on water intake, Frequency 1x/day, Strain No, and Splinting no Fully empty rectum: Yes:   Leakage: No Pads: No  URINATION: Pain with urination: No Fully empty bladder: No Stream: Strong Urgency: Yes: but does not cause incontinence  Frequency: 5-6x/day  Leakage: no regular leakage, but have had instances of leakage in the past  Pads: Yes: if she knows she will be away from a bathroom for a while, but has never experienced wetness in pad   INTERCOURSE: Not sexually active   PREGNANCY: Vaginal deliveries 2  PROLAPSE: None   OBJECTIVE:  Note: Objective measures were completed at Evaluation unless otherwise noted.  PATIENT SURVEYS:  PFIQ-7: 29 Discharge: 0  COGNITION: Overall cognitive status: Within functional limits for tasks assessed   SENSATION: Light touch: Appears intact Proprioception: Appears intact  FUNCTIONAL TESTS:  Squat: WNL  GAIT: Mild trendelenburg gait pattern with ambulation   POSTURE: rounded shoulders and forward head  PELVIC ALIGNMENT: WNL  LUMBARAROM/PROM:  A/PROM A/PROM  eval  Flexion 25%  limited  Extension 25% limited  Right lateral flexion 25% limited  Left lateral flexion 25% limited  Right rotation   Left rotation    (Blank rows = not tested)  PALPATION:   General: patient not tender to palpation along bilateral adductors/pubic bone/lower abdominals/hip flexors                 External Perineal Exam: noted vaginal  dryness                             Internal Pelvic Floor: general weakness throughout, no pain with palpation of internal pelvic floor musculature   Patient confirms identification and approves PT to assess internal pelvic floor and treatment Yes No emotional/communication barriers or cognitive limitation. Patient is motivated to learn. Patient understands and agrees with treatment goals and plan. PT explains patient will be examined in standing, sitting, and lying down to see how their muscles and joints work. When they are ready, they will be asked to remove their underwear so PT can examine their perineum. The patient is also given the option of providing their own chaperone as one is not provided in our facility. The patient also has the right and is explained the right to defer or refuse any part of the evaluation or treatment including the internal exam. With the patient's consent, PT will use one gloved finger to gently assess the muscles of the pelvic floor, seeing how well it contracts and relaxes and if there is muscle symmetry. After, the patient will get dressed and PT and patient will discuss exam findings and plan of care. PT and patient discuss plan of care, schedule, attendance policy and HEP activities.  PELVIC MMT:   MMT eval  Vaginal 3/5, 2 second isometric, 3 quick flicks   Internal Anal Sphincter   External Anal Sphincter   Puborectalis   Diastasis Recti   (Blank rows = not tested)        TONE: General decrease in muscle tone throughout bilateral pelvic floor musculature   PROLAPSE: N/A  TODAY'S TREATMENT:                                                                                                                               DATE:  01/07/24: Neuromotor re-education:   Sit to stand + 5# dumbbell + diaphragmatic breathing 2x10  Seated diaphragmatic breathing + pelvic floor AROM with breathing 2x10  Step up forward/lateral with 5# weight + diaphragmatic breathing 2x10 each  Goblet squat/sit to stand with 5# weight + diaphragmatic breathing 2x10  Standing march with opposite shoulder press (1# weights) + diaphragmatic breathing 2x10  Therapeutic activity  Treadmill walking for loading/blood flow speed 2.0, 6 minutes  pelvic floor AROM, vaginal moisturizers, connection between diaphragm and pelvic floor musculature, urge suppression drill  01/13/24: Neuromotor re-education:   squat + 5# dumbbell + diaphragmatic breathing 2x8  Step up forward/lateral with 5# weight + diaphragmatic breathing 2x10 each  Standing hip abduction (2# ankle weights) + diaphragmatic breathing 2x10 each side  Bird dog + diaphragmatic breathing 2x10 alternating  Standing march with opposite shoulder press (2# weights) + diaphragmatic breathing 2x10  Bird dog at counter + diaphragmatic breathing 2x10 alternating  Therapeutic activity  Treadmill walking for loading/blood flow speed 2.0, 6 minutes  pelvic floor AROM, vaginal  moisturizers, connection between diaphragm and pelvic floor musculature, urge suppression drill  01/21/24: Neuromotor re-education:   squat + 5# dumbbell + diaphragmatic breathing 2x8  Step up forward/lateral with 5# weight + diaphragmatic breathing 2x10 each  Standing hip abduction (2# ankle weights) + diaphragmatic breathing 2x10 each side  Bird dog + diaphragmatic breathing 2x10 alternating  Standing march with opposite shoulder press (2# weights) + diaphragmatic breathing 2x10  Bird dog at counter + diaphragmatic breathing 2x10 alternating  Therapeutic activity  NuStep L4 5 mins - PT present to discuss current status   pelvic floor AROM, vaginal moisturizers, connection between diaphragm and pelvic floor musculature, urge suppression drill  PATIENT EDUCATION:  Education details: pelvic floor AROM, vaginal moisturizers, connection between diaphragm and pelvic floor musculature, urge suppression drill Person educated: Patient Education method: Explanation, Demonstration, Tactile cues, Verbal cues, and Handouts Education comprehension: verbalized understanding, returned demonstration, verbal cues required, and tactile cues required  HOME EXERCISE PROGRAM: Access Code: [facility] URL: [email or web address] Date: 01/21/2024 Prepared by: Earna Coder  Exercises - Recumbent Bike  - 1 x daily - 7 x weekly - 2 sets - 10 reps - Seated Pelvic Floor Contraction  - 1 x daily - 7 x weekly - 2 sets - 10 reps - Lateral Step Up  - 1 x daily - 7 x weekly - 2 sets - 10 reps - Sit to Stand Without Arm Support  - 1 x daily - 7 x weekly - 2 sets - 10 reps - Standing Row with Anchored Resistance  - 1 x daily - 7 x weekly - 2 sets - 10 reps - Standing Shoulder Extension with Resistance  - 1 x daily - 7 x weekly - 2 sets - 10 reps  ASSESSMENT:  CLINICAL IMPRESSION: Patient is a 73 y.o. female who was seen today for physical therapy discharge for urinary urgency and incontinence. Patient is no longer experiencing urinary leakage on a daily basis and has had no instances of leakage since last visit. Patient has attended 6 sessions of pelvic PT and is very pleased with her progress. Exercises were progressed with intensity today and patient had No leakage or increase in pelvic discomfort following today's session. Pt is appropriate for discharge at this time.   OBJECTIVE IMPAIRMENTS: decreased coordination, decreased endurance, decreased ROM, and decreased strength.   ACTIVITY LIMITATIONS: continence  PARTICIPATION LIMITATIONS:  none  PERSONAL FACTORS: 1-2 comorbidities: IBS, osteopenia   are also affecting  patient's functional outcome.   REHAB POTENTIAL: Good  CLINICAL DECISION MAKING: Stable/uncomplicated  EVALUATION COMPLEXITY: Low   GOALS: Goals reviewed with patient? Yes  SHORT TERM GOALS: Target date: 01/06/2024  Pt will be independent with HEP.  Baseline: Goal status: GOAL MET 01/14/24  2.  Pt will be able to teach back and utilize urge suppression technique in order to help reduce number of trips to the bathroom.   Baseline:  Goal status: GOAL MET 01/14/24  3.  Pt will be able to go 2-3 hours in between voids without urgency or incontinence in order to improve QOL and perform all functional activities with less difficulty.   Baseline: 1-2 hours  Goal status: GOAL MET 01/14/24   LONG TERM GOALS: Target date: 01/06/2024  Pt will be independent with advanced HEP.  Baseline:  Goal status: GOAL MET 01/14/24  2.  Pt to demonstrate improved coordination of pelvic floor and breathing mechanics with 10# squat with appropriate synergistic patterns to decrease pain and leakage at least 75% of the time.  Baseline:  Goal status: GOAL MET 01/21/24  3.  Pt to report improved time between bladder voids to at least 2 hours for improved QOL with decreased urinary frequency.   Baseline: 1 hour Goal status: GOAL MET 01/14/24  4.  Pt to demonstrate at least 4/5 pelvic floor strength for improved pelvic stability and decreased strain at pelvic floor/ decrease leakage.  Baseline: 4/5 Goal status: GOAL MET 01/21/24  PLAN: PHYSICAL THERAPY DISCHARGE SUMMARY  Visits from Start of Care: 6  Current functional level related to goals / functional outcomes: See above    Remaining deficits: See above    Education / Equipment: See above    Patient agrees to discharge. Patient goals were met. Patient is being discharged due to meeting the stated rehab goals.  Earna Coder, PT, DPT 01/21/24 10:14 AM

## 2024-01-22 ENCOUNTER — Other Ambulatory Visit: Payer: Self-pay | Admitting: Obstetrics and Gynecology

## 2024-01-22 DIAGNOSIS — Z1231 Encounter for screening mammogram for malignant neoplasm of breast: Secondary | ICD-10-CM

## 2024-01-27 NOTE — Progress Notes (Addendum)
 Chief Complaint:discuss EGD and colon cancer screening Primary GI Doctor:Dr. Doy Hutching  HPI:  Patient is a 73 year old female patient with past medical history of hypertension, GERD, prediabetes, B12 deficiency, who was referred to me by Jeani Sow, MD on 11/03/23 for colon cancer screening and discuss EGD.  Interval History     Patient presents to establish care with new gastroenterologist and discuss procedures. Patient has history of GERD and takes Pepcid 20 mg po daily at bedtime. She states she has intermittent episodes of chest discomfort after eating. She will have sip of water and it passes. She states she has seen cardiology in past in [place], however it has been about 4 years. She has been evaluated for chest pain in past and told related to reflux. She had recent episode of nocturnal reflux where she regurgitated her food. She thinks it was from a cinnamon tea she was drinking so she discontinued it. No further issues. She has occasional issues with swallowing certain foods like arugula where it sticks in the back of her throat. She also has recent hoarseness. She has seasonal allergies with post nasal drainage. She is not on any allergy medication.  Patient denies nausea, vomiting, or weight loss. She states she has had weight gain, she thinks due to overeating.   She also states recently diagnosed with osteoporosis and her calcium supplement was increased which has caused constipation. She started adding fiber into her diet and uses stool softeners prn. Denies blood in stool.   She drinks about 1 glass wine three times weekly. Nonsmoker.  Patient taking baby ASA 81 mg po three times a week.  Patient has cardiology consult in few weeks to establish care since relocating here.   Patients last colonoscopy 01/2013 per patient and normal. She thinks she had another one in 2004 that was also normal. She had these done in [place]. She states she has tried to obtain records and  unable.  Patient recently had thyroid ultrasound and had thyroid nodules that were biopsied and negative. She will need quarterly monitoring.  Patient family history includes GERD in mother, brother, son, and several other family members.  Wt Readings from Last 3 Encounters:  01/28/24 139 lb (63 kg)  01/02/24 142 lb (64.4 kg)  11/17/23 138 lb 6.4 oz (62.8 kg)    Past Medical History:  Diagnosis Date   Acid reflux    Agatston CAC score 100-199    Basal cell carcinoma    Cataract    Coronary atherosclerosis    Deviated nasal septum    Eczema    Gastroesophageal reflux disease    H/O bone density study 2019-2015   H/O echocardiogram 2015   H/O exercise stress test 3/4-5 2015   Hearing loss    High blood pressure    High cholesterol    History of chest pain    History of prediabetes    Hx of mammogram 2018-2017-2016-2015-2014   Hyperlipidemia    IBS (irritable bowel syndrome)    Intervertebral disc disease    Iron deficiency anemia    On statin therapy    Osteoarthritis    Osteopenia    unspecified  Location   SNHL (sensorineural hearing loss)    Spinal stenosis    Tinnitus of left ear    Vitamin D deficiency     Past Surgical History:  Procedure Laterality Date   ENDOMETRIAL BIOPSY     EYE SURGERY     lazy eye  when 73yo   MOHS SURGERY     for basal cell to the right of right eye   TONSILLECTOMY      Current Outpatient Medications  Medication Sig Dispense Refill   aspirin EC 81 MG tablet Take 81 mg by mouth every Monday, Wednesday, and Friday. Swallow whole.     atorvastatin (LIPITOR) 40 MG tablet Take 1 tablet (40 mg total) by mouth daily. 90 tablet 3   Calcium Carb-Cholecalciferol (CALTRATE 600+D3 PO) Take 600 mLs by mouth daily. 1-2 tablets daily     famotidine (PEPCID) 20 MG tablet Take 1 tablet (20 mg total) by mouth daily. 90 tablet 3   lisinopril (ZESTRIL) 10 MG tablet Take 1 tablet (10 mg total) by mouth daily. 90 tablet 3   vitamin B-12  (CYANOCOBALAMIN) 1000 MCG tablet Take 1,000 mcg by mouth as needed.     zoledronic acid (RECLAST) 5 MG/100ML SOLN injection      No current facility-administered medications for this visit.    Allergies as of 01/28/2024   (No Known Allergies)    Family History  Problem Relation Age of Onset   Stroke Mother 19   Basal cell carcinoma Mother    Hypertension Mother    Basal cell carcinoma Father    Hypertension Father    Varicose Veins Father    Breast cancer Paternal Aunt        1s   Heart attack Paternal Grandfather    Heart disease Paternal Grandfather    Breast cancer Cousin        30s-40s   Cancer Maternal Grandfather    Depression Paternal Grandmother    Cancer Paternal Aunt     Review of Systems:    Constitutional: No weight loss, fever, chills, weakness or fatigue HEENT: Eyes: No change in vision               Ears, Nose, Throat:  No change in hearing or congestion Skin: No rash or itching Cardiovascular: No chest pain, chest pressure or palpitations   Respiratory: No SOB or cough Gastrointestinal: See HPI and otherwise negative Genitourinary: No dysuria or change in urinary frequency Neurological: No headache, dizziness or syncope Musculoskeletal: No new muscle or joint pain Hematologic: No bleeding or bruising Psychiatric: No history of depression or anxiety    Physical Exam:  Vital signs: BP 120/64   Pulse (!) 57   Ht 5' 0.5" (1.537 m)   Wt 139 lb (63 kg)   BMI 26.70 kg/m   Constitutional:   Pleasant  female appears to be in NAD, Well developed, Well nourished, alert and cooperative Throat: Oral cavity and pharynx without inflammation, swelling or lesion.  Respiratory: Respirations even and unlabored. Lungs clear to auscultation bilaterally.   No wheezes, crackles, or rhonchi.  Cardiovascular: Normal S1, S2. Regular rate and rhythm. No peripheral edema, cyanosis or pallor.  Gastrointestinal:  Soft, nondistended, nontender. No rebound or guarding.  Normal bowel sounds. No appreciable masses or hepatomegaly. Rectal:  Not performed.  Msk:  Symmetrical without gross deformities. Without edema, no deformity or joint abnormality.  Neurologic:  Alert and  oriented x4;  grossly normal neurologically.  Skin:   Dry and intact without significant lesions or rashes. Psychiatric: Oriented to person, place and time. Demonstrates good judgement and reason without abnormal affect or behaviors.  RELEVANT LABS AND IMAGING: CBC    Latest Ref Rng & Units 11/17/2023    9:35 AM 11/08/2022   11:41 AM 10/15/2021    8:43 AM  CBC  WBC 4.0 - 10.5 K/uL 5.0  4.5  4.7   Hemoglobin 12.0 - 15.0 g/dL 16.1  09.6  04.5   Hematocrit 36.0 - 46.0 % 40.2  40.3  37.4   Platelets 150.0 - 400.0 K/uL 217.0  272.0  223      CMP     Latest Ref Rng & Units 11/17/2023    9:35 AM 07/04/2023    1:52 PM 05/09/2023    9:56 AM  CMP  Glucose 70 - 99 mg/dL 90   91   BUN 6 - 23 mg/dL 16   21   Creatinine 4.09 - 1.20 mg/dL 8.11   9.14   Sodium 782 - 145 mEq/L 140   137   Potassium 3.5 - 5.1 mEq/L 4.3   4.2   Chloride 96 - 112 mEq/L 107   103   CO2 19 - 32 mEq/L 26   24   Calcium 8.4 - 10.5 mg/dL 9.1  9.6  9.6   Total Protein 6.0 - 8.3 g/dL 6.8   6.5   Total Bilirubin 0.2 - 1.2 mg/dL 0.6   0.9   Alkaline Phos 39 - 117 U/L 48   57   AST 0 - 37 U/L 17   17   ALT 0 - 35 U/L 21   16      Lab Results  Component Value Date   TSH 1.65 11/08/2022  01/02/13 colonoscopy- normal per patient  Assessment: Encounter Diagnoses  Name Primary?   Gastroesophageal reflux disease, unspecified whether esophagitis present Yes   Esophageal dysphagia    Non-cardiac chest pain    Hoarseness    Special screening for malignant neoplasms, colon    Altered bowel habits      73 year old female patient who presents to establish care with gastroenterologist. She has history of GERD not managed with Pepcid 20 mg once daily. For the break through symptoms of nocturnal reflux along with intermittent  dysphagia and hoarseness we discussed increasing her Pepcid 20 mg po daily to twice daily.  She Kaytlin Burklow have LPR. Reinforced strict GERD diet, no late meals. She also has intermittent chest discomfort for several years. She was told in past it was due to reflux. Never had endoscopic evaluation.  Will schedule upper GI endoscopy to r/o stricture, web, or esophagitis. She also has seasonal allergies and I suggested she Krystyl Cannell try antihistamine or seeing ENT. She would like to hold off for now.        For the mild constipation from increasing her calcium supplements we discussed high fiber diet and she can use stool softeners as needed. Patient also due for colon screening colonoscopy, will schedule today.   Plan: -Increase Pepcid 20mg  po daily to 20 mg twice daily  -Strict GERD, no late meals 3-4 hours before lying down - Continue High fiber diet - Continue stool softeners as needed. -Schedule EGD in [facility] with Dr. Doy Hutching.  The risks and benefits of EGD with possible biopsies and esophageal dilation were discussed with the patient who agrees to proceed.  -Schedule for a colonoscopy in [facility] with Dr. Doy Hutching. The risks and benefits of colonoscopy with possible polypectomy / biopsies were discussed and the patient agrees to proceed.     Thank you for the courtesy of this consult. Please call me with any questions or concerns.   Karimah Winquist, FNP-C [facility] 01/28/2024, 10:27 AM  Cc: Jeani Sow, MD  I have reviewed the clinic note as  outlined by Charmaine Downs, NP and agree with the assessment, plan and medical decision making.  Ms. Sorto has a history of longstanding GERD endorsing some symptoms of dysphagia as well as possible LPR.  Currently managed on Pepcid 20 mg orally daily.  I agree with recommendations to dose optimize H2 blocker therapy and proceed with EGD.  PPI Emari Hreha also be a consideration in the future.  She is due for colorectal cancer screening which can be performed at the same time  as EGD.  Maren Beach, MD

## 2024-01-28 ENCOUNTER — Ambulatory Visit: Payer: Medicare Other | Admitting: Gastroenterology

## 2024-01-28 ENCOUNTER — Encounter: Payer: Self-pay | Admitting: Gastroenterology

## 2024-01-28 VITALS — BP 120/64 | HR 57 | Ht 60.5 in | Wt 139.0 lb

## 2024-01-28 DIAGNOSIS — R0789 Other chest pain: Secondary | ICD-10-CM

## 2024-01-28 DIAGNOSIS — K219 Gastro-esophageal reflux disease without esophagitis: Secondary | ICD-10-CM | POA: Diagnosis not present

## 2024-01-28 DIAGNOSIS — R194 Change in bowel habit: Secondary | ICD-10-CM

## 2024-01-28 DIAGNOSIS — R490 Dysphonia: Secondary | ICD-10-CM

## 2024-01-28 DIAGNOSIS — Z1211 Encounter for screening for malignant neoplasm of colon: Secondary | ICD-10-CM

## 2024-01-28 DIAGNOSIS — R1319 Other dysphagia: Secondary | ICD-10-CM

## 2024-01-28 DIAGNOSIS — R1310 Dysphagia, unspecified: Secondary | ICD-10-CM

## 2024-01-28 MED ORDER — SUFLAVE 178.7 G PO SOLR: 1.0000 | Freq: Once | ORAL | 0 refills | Status: AC

## 2024-01-28 NOTE — Patient Instructions (Addendum)
 Increase Pepcid 20mg  po daily to twice daily before breakfast and at bedtime Strict GERD, no late meals 3-4 hours before lying down  Continue to use Stool softeners as needed.  We have given you a high fiber diet handout. Please strive to have 25-30 grams of fiber daily.   You have been scheduled for an endoscopy and colonoscopy. Please follow the written instructions given to you at your visit today.  If you use inhalers (even only as needed), please bring them with you on the day of your procedure.  DO NOT TAKE 7 DAYS PRIOR TO TEST- Trulicity (dulaglutide) Ozempic, Wegovy (semaglutide) Mounjaro (tirzepatide) Bydureon Bcise (exanatide extended release)  DO NOT TAKE 1 DAY PRIOR TO YOUR TEST Rybelsus (semaglutide) Adlyxin (lixisenatide) Victoza (liraglutide) Byetta (exanatide) ___________________________________________________________________________  Bonita Quin will receive your bowel preparation through [facility], which ensures the lowest copay and home delivery, with outreach via text or call from an [phone] number. Please respond promptly to avoid rescheduling of your procedure. If you are interested in alternative options or have any questions regarding your prep, please contact them at [phone] ____________________________________________________________________________  Your Provider Has Sent Your Bowel Prep Regimen To [facility]   [facility] will contact you to verify your information and collect your copay, if applicable. Enjoy the comfort of your home while your prescription is mailed to you, FREE of any shipping charges.   [facility] accepts all major insurance benefits and applies discounts & coupons.  Have additional questions?   Chat: [email or web address] Call: [phone] Email: [email or web address] NCPDP: [id number]  How will [facility] contact you?  With a Welcome phone call,  a Welcome text and a checkout link in text form.  Texts you receive from  [phone] Are NOT Spam.  *To set up delivery, you must complete the checkout process via link or speak to one of the patient care representatives. If [facility] is unable to reach you, your prescription may be delayed.  To avoid long hold times on the phone, you may also utilize the secure chat feature on the [facility] website to request that they call you back for transaction completion or to expedite your concerns.   Thank you for trusting me with your gastrointestinal care!   Deanna May, NP     _______________________________________________________  If your blood pressure at your visit was 140/90 or greater, please contact your primary care physician to follow up on this.  _______________________________________________________  If you are age 74 or older, your body mass index should be between 23-30. Your Body mass index is 26.7 kg/m. If this is out of the aforementioned range listed, please consider follow up with your Primary Care Provider.  If you are age 74 or younger, your body mass index should be between 19-25. Your Body mass index is 26.7 kg/m. If this is out of the aformentioned range listed, please consider follow up with your Primary Care Provider.   ________________________________________________________  The [facility] providers would like to encourage you to use [facility] to communicate with providers for non-urgent requests or questions.  Due to long hold times on the telephone, sending your provider a message by [facility] may be a faster and more efficient way to get a response.  Please allow 48 business hours for a response.  Please remember that this is for non-urgent requests.  _______________________________________________________

## 2024-02-03 ENCOUNTER — Ambulatory Visit
Admission: RE | Admit: 2024-02-03 | Discharge: 2024-02-03 | Disposition: A | Discharge status: Home / Self Care | Source: Ambulatory Visit | Attending: Obstetrics and Gynecology | Admitting: Obstetrics and Gynecology

## 2024-02-03 DIAGNOSIS — Z1231 Encounter for screening mammogram for malignant neoplasm of breast: Secondary | ICD-10-CM

## 2024-02-06 ENCOUNTER — Other Ambulatory Visit: Payer: Self-pay | Admitting: Obstetrics and Gynecology

## 2024-02-06 ENCOUNTER — Encounter: Payer: Self-pay | Admitting: Family Medicine

## 2024-02-06 DIAGNOSIS — R928 Other abnormal and inconclusive findings on diagnostic imaging of breast: Secondary | ICD-10-CM

## 2024-02-09 ENCOUNTER — Ambulatory Visit
Admission: RE | Admit: 2024-02-09 | Discharge: 2024-02-09 | Disposition: A | Discharge status: Home / Self Care | Source: Ambulatory Visit | Attending: Obstetrics and Gynecology | Admitting: Obstetrics and Gynecology

## 2024-02-09 DIAGNOSIS — R928 Other abnormal and inconclusive findings on diagnostic imaging of breast: Secondary | ICD-10-CM

## 2024-02-10 ENCOUNTER — Encounter: Payer: Self-pay | Admitting: Obstetrics and Gynecology

## 2024-02-12 ENCOUNTER — Ambulatory Visit: Payer: Medicare Other | Attending: Cardiology | Admitting: Cardiology

## 2024-02-12 ENCOUNTER — Encounter: Payer: Self-pay | Admitting: Cardiology

## 2024-02-12 VITALS — BP 126/70 | HR 55 | Ht 60.5 in | Wt 140.0 lb

## 2024-02-12 DIAGNOSIS — I2510 Atherosclerotic heart disease of native coronary artery without angina pectoris: Secondary | ICD-10-CM | POA: Insufficient documentation

## 2024-02-12 DIAGNOSIS — R011 Cardiac murmur, unspecified: Secondary | ICD-10-CM | POA: Insufficient documentation

## 2024-02-12 DIAGNOSIS — R072 Precordial pain: Secondary | ICD-10-CM | POA: Insufficient documentation

## 2024-02-12 DIAGNOSIS — I10 Essential (primary) hypertension: Secondary | ICD-10-CM | POA: Diagnosis present

## 2024-02-12 DIAGNOSIS — E7800 Pure hypercholesterolemia, unspecified: Secondary | ICD-10-CM | POA: Insufficient documentation

## 2024-02-12 MED ORDER — ATORVASTATIN CALCIUM 80 MG PO TABS: 80.0000 mg | ORAL_TABLET | Freq: Every day | ORAL | 3 refills | Status: AC

## 2024-02-12 NOTE — Patient Instructions (Signed)
 Medication Instructions:  Increase:  Atrorvastatin (Lipitor) to 80 mg once daily  *If you need a refill on your cardiac medications before your next appointment, please call your pharmacy*  Lab Work: In two months (due around 04/13/2024), please have a FASTING Lipid panel and CMET drawn (you do not need appointment; can go to any [facility] or to our new building)  Testing/Procedures: Your physician has requested that you have an echocardiogram. Echocardiography is a painless test that uses sound waves to create images of your heart. It provides your doctor with information about the size and shape of your heart and how well your heart's chambers and valves are working. This procedure takes approximately one hour. There are no restrictions for this procedure. Please do NOT wear cologne, perfume, aftershave, or lotions (deodorant is allowed). Please arrive 15 minutes prior to your appointment time.      Please report to Radiology at the [facility] Main Entrance 30 minutes early for your test.  [address] [place], [place] [address]                      How to Prepare for Your Cardiac PET/CT Stress Test:  Nothing to eat or drink, except water, 3 hours prior to arrival time.  NO caffeine/decaffeinated products, or chocolate 12 hours prior to arrival. (Please note decaffeinated beverages (teas/coffees) still contain caffeine).  If you have caffeine within 12 hours prior, the test will need to be rescheduled.  Medication instructions: Do not take nitrates (isosorbide mononitrate, Ranexa) the day before or day of test Do not take tamsulosin the day before or morning of test Hold theophylline containing medications for 12 hours. Hold Dipyridamole 48 hours prior to the test.  Diabetic Preparation: If able to eat breakfast prior to 3 hour fasting, you may take all medications, including your insulin. Do not worry if you miss your breakfast dose of insulin - start at your next  meal. If you do not eat prior to 3 hour fast-Hold all diabetes (oral and insulin) medications. Patients who wear a continuous glucose monitor MUST remove the device prior to scanning.  You may take your remaining medications with water.  NO perfume, cologne or lotion on chest or abdomen area. FEMALES - Please avoid wearing dresses to this appointment.  Total time is 1 to 2 hours; you may want to bring reading material for the waiting time.  IF YOU THINK YOU MAY BE PREGNANT, OR ARE NURSING PLEASE INFORM THE TECHNOLOGIST.  In preparation for your appointment, medication and supplies will be purchased.  Appointment availability is limited, so if you need to cancel or reschedule, please call the Radiology Department Scheduler at [phone] 24 hours in advance to avoid a cancellation fee of $100.00  What to Expect When you Arrive:  Once you arrive and check in for your appointment, you will be taken to a preparation room within the Radiology Department.  A technologist or Nurse will obtain your medical history, verify that you are correctly prepped for the exam, and explain the procedure.  Afterwards, an IV will be started in your arm and electrodes will be placed on your skin for EKG monitoring during the stress portion of the exam. Then you will be escorted to the PET/CT scanner.  There, staff will get you positioned on the scanner and obtain a blood pressure and EKG.  During the exam, you will continue to be connected to the EKG and blood pressure machines.  A small, safe amount of a radioactive tracer will be injected in your IV to obtain a series of pictures of your heart along with an injection of a stress agent.    After your Exam:  It is recommended that you eat a meal and drink a caffeinated beverage to counter act any effects of the stress agent.  Drink plenty of fluids for the remainder of the day and urinate frequently for the first couple of hours after the exam.  Your doctor will  inform you of your test results within 7-10 business days.  For more information and frequently asked questions, please visit our website: [email or web address]  For questions about your test or how to prepare for your test, please call: Cardiac Imaging Nurse Navigators Office: [phone]   Follow-Up: At [facility], you and your health needs are our priority.  As part of our continuing mission to provide you with exceptional heart care, our providers are all part of one team.  This team includes your primary Cardiologist (physician) and Advanced Practice Providers or APPs (Physician Assistants and Nurse Practitioners) who all work together to provide you with the care you need, when you need it.  Your next appointment:   4 month(s)  Provider:   Little Ishikawa, MD          Valet parking services will be available as well.

## 2024-02-12 NOTE — Progress Notes (Signed)
 Cardiology Office Note:    Date:  02/12/2024   ID:  Holly Russell, DOB 1951/05/13, MRN [id number]  PCP:  Jeani Sow, MD  Cardiologist:  Little Ishikawa, MD  Electrophysiologist:  None   Referring MD: Jeani Sow, MD   Chief Complaint  Patient presents with   Coronary Artery Disease    History of Present Illness:    Holly Russell is a 73 y.o. female with a hx of hypertension, hyperlipidemia, prediabetes, GERD who is referred by Dr Ruthine Dose for evaluation of CAD.  CT chest 11/2023 noted to have coronary calcifications.   She reports she previously followed with cardiologist in [facility] for her hyperlipidemia.  Does report she has been having some right-sided chest pain, occurs about once per month.  Describes a dull aching pain that radiates to back.  Typically lasts less than 2 minutes.  No clear relationship with exertion.  She walks 3 miles per day.  She denies any dyspnea, lower extremity edema, or palpitations.  Reports rare lightheadedness when eating large meal, denies any syncope.  No smoking history.  Family history includes paternal grandfather had MI in 57s.    Past Medical History:  Diagnosis Date   Acid reflux    Agatston CAC score 100-199    Basal cell carcinoma    Cataract    Coronary atherosclerosis    Deviated nasal septum    Eczema    Gastroesophageal reflux disease    H/O bone density study 2019-2015   H/O echocardiogram 2015   H/O exercise stress test 3/4-5 2015   Hearing loss    High blood pressure    High cholesterol    History of chest pain    History of prediabetes    Hx of mammogram 2018-2017-2016-2015-2014   Hyperlipidemia    IBS (irritable bowel syndrome)    Intervertebral disc disease    Iron deficiency anemia    On statin therapy    Osteoarthritis    Osteopenia    unspecified  Location   SNHL (sensorineural hearing loss)    Spinal stenosis    Tinnitus of left ear    Vitamin D deficiency     Past Surgical History:   Procedure Laterality Date   ENDOMETRIAL BIOPSY     EYE SURGERY     lazy eye when 73yo   MOHS SURGERY     for basal cell to the right of right eye   TONSILLECTOMY      Current Medications: Current Meds  Medication Sig   aspirin EC 81 MG tablet Take 81 mg by mouth every Monday, Wednesday, and Friday. Swallow whole.   Calcium Carb-Cholecalciferol (CALTRATE 600+D3 PO) Take 600 mLs by mouth daily. 1-2 tablets daily   famotidine (PEPCID) 20 MG tablet Take 1 tablet (20 mg total) by mouth daily. (Patient taking differently: Take 20 mg by mouth 2 (two) times daily.)   lisinopril (ZESTRIL) 10 MG tablet Take 1 tablet (10 mg total) by mouth daily.   vitamin B-12 (CYANOCOBALAMIN) 1000 MCG tablet Take 1,000 mcg by mouth as needed.   zoledronic acid (RECLAST) 5 MG/100ML SOLN injection    [DISCONTINUED] atorvastatin (LIPITOR) 40 MG tablet Take 1 tablet (40 mg total) by mouth daily.     Allergies:   Patient has no known allergies.   Social History   Socioeconomic History   Marital status: Married    Spouse name: Not on file   Number of children: 2   Years of education:  Not on file   Highest education level: Professional school degree (e.g., MD, DDS, DVM, JD)  Occupational History   Not on file  Tobacco Use   Smoking status: Never   Smokeless tobacco: Never  Vaping Use   Vaping status: Never Used  Substance and Sexual Activity   Alcohol use: Yes    Alcohol/week: 3.0 standard drinks of alcohol    Types: 3 Glasses of wine per week    Comment: 2-3/wk   Drug use: Never   Sexual activity: Not Currently    Partners: Male    Birth control/protection: Post-menopausal  Other Topics Concern   Not on file  Social History Narrative   Diet: Plant Based and Fish      Do you drink/ eat things with caffeine?  Yes      Marital status:  Married       2 children, 1 grand            What year were you married ? 1976      Do you live in a house, apartment,assistred living, condo, trailer, etc.)?  Condo-Town House      Is it one or more stories? 2      How many persons live in your home ? 2      Do you have any pets in your home ?(please list) No      Highest Level of education completed: Law School      Current or past profession: Attorney      Do you exercise?     Yes                         Type & how often Walk-3 miles daily      ADVANCED DIRECTIVES (Please bring copies)      Do you have a living will? Yes      Do you have a DNR form?   Yes                    If not, do you want to discuss one?       Do you have signed POA?HPOA forms?  Yes               If so, please bring to your appointment      FUNCTIONAL STATUS- To be completed by Spouse / child / Staff       Do you have difficulty bathing or dressing yourself ?  No      Do you have difficulty preparing food or eating ?  No      Do you have difficulty managing your mediation ?  No      Do you have difficulty managing your finances ?  No      Do you have difficulty affording your medication ?  No      Social Drivers of Corporate investment banker Strain: Low Risk  (05/05/2023)   Overall Financial Resource Strain (CARDIA)    Difficulty of Paying Living Expenses: Not hard at all  Food Insecurity: Low Risk  (06/24/2023)   Received from [facility]   Hunger Vital Sign    Worried About Running Out of Food in the Last Year: Never true    Ran Out of Food in the Last Year: Never true  Transportation Needs: No Transportation Needs (06/24/2023)   Received from [facility]    In the past 12 months, has  lack of reliable transportation kept you from medical appointments, meetings, work or from getting things needed for daily living? : No  Physical Activity: Sufficiently Active (05/05/2023)   Exercise Vital Sign    Days of Exercise per Week: 6 days    Minutes of Exercise per Session: 60 min  Stress: No Stress Concern Present (05/05/2023)   [employer] of Occupational Health - Occupational  Stress Questionnaire    Feeling of Stress : Only a little  Social Connections: Moderately Integrated (05/05/2023)   Social Connection and Isolation Panel [NHANES]    Frequency of Communication with Friends and Family: Twice a week    Frequency of Social Gatherings with Friends and Family: Once a week    Attends Religious Services: Never    Database administrator or Organizations: Yes    Attends Engineer, structural: More than 4 times per year    Marital Status: Married     Family History: The patient's family history includes Basal cell carcinoma in her father and mother; Breast cancer in her cousin and paternal aunt; Cancer in her maternal grandfather and paternal aunt; Depression in her paternal grandmother; Heart attack in her paternal grandfather; Heart disease in her paternal grandfather; Hypertension in her father and mother; Stroke (age of onset: 8) in her mother; Varicose Veins in her father.  ROS:   Please see the history of present illness.     All other systems reviewed and are negative.  EKGs/Labs/Other Studies Reviewed:    The following studies were reviewed today:   EKG:   02/12/2024: Sinus bradycardia, rate 55, no ST abnormalities  Recent Labs: 07/04/2023: Magnesium 2.1 11/17/2023: ALT 21; BUN 16; Creatinine, Ser 0.75; Hemoglobin 13.4; Platelets 217.0; Potassium 4.3; Sodium 140  Recent Lipid Panel    Component Value Date/Time   CHOL 277 (H) 11/17/2023 0935   TRIG 44.0 11/17/2023 0935   HDL 141.10 11/17/2023 0935   CHOLHDL 2 11/17/2023 0935   VLDL 8.8 11/17/2023 0935   LDLCALC 127 (H) 11/17/2023 0935   LDLCALC 112 (H) 04/29/2022 0813    Physical Exam:    VS:  BP 126/70   Pulse (!) 55   Ht 5' 0.5" (1.537 m)   Wt 140 lb (63.5 kg)   SpO2 97%   BMI 26.89 kg/m     Wt Readings from Last 3 Encounters:  02/12/24 140 lb (63.5 kg)  01/28/24 139 lb (63 kg)  01/02/24 142 lb (64.4 kg)     GEN:  WeShe w nourished, well developed in no acute  distress HEENT: Normal NECK: No JVD; No carotid bruits LYMPHATICS: No lymphadenopathy CARDIAC: RRR, 2/6 systolic murmur RESPIRATORY:  Clear to auscultation without rales, wheezing or rhonchi  ABDOMEN: Soft, non-tender, non-distended MUSCULOSKELETAL:  No edema; No deformity  SKIN: Warm and dry NEUROLOGIC:  Alert and oriented x 3 PSYCHIATRIC:  Normal affect   ASSESSMENT:    1. Precordial chest pain   2. CAD in native artery   3. Murmur, cardiac   4. Essential (primary) hypertension   5. Pure hypercholesterolemia    PLAN:    CAD: CT chest 11/2023 noted to have significant coronary calcifications.  She is reporting atypical chest pain.  Recommend stress PET to evaluate for ischemia.  Heart murmur: 2 out of 6 systolic murmur.  Aortic valve calcifications noted on CT chest.  Check echocardiogram  Hypertension: on lisinopril 10 mg daily.  Appears controlled.   Hyperlipidemia: on atorvastatin 40 mg daily.  LDL 127 on 11/17/2023.  Given significant coronary calcifications on CT chest 11/2023, recommend increasing atorvastatin to 80 mg daily.  Check lipid panel in 2 months  RTC in 4 months    Informed Consent   Shared Decision Making/Informed Consent The risks [chest pain, shortness of breath, cardiac arrhythmias, dizziness, blood pressure fluctuations, myocardial infarction, stroke/transient ischemic attack, nausea, vomiting, allergic reaction, radiation exposure, metallic taste sensation and life-threatening complications (estimated to be 1 in 10,000)], benefits (risk stratification, diagnosing coronary artery disease, treatment guidance) and alternatives of a cardiac PET stress test were discussed in detail with Holly Russell and she agrees to proceed.        Medication Adjustments/Labs and Tests Ordered: Current medicines are reviewed at length with the patient today.  Concerns regarding medicines are outlined above.  Orders Placed This Encounter  Procedures   NM PET CT CARDIAC  PERFUSION MULTI W/ABSOLUTE BLOODFLOW   Comprehensive Metabolic Panel (CMET)   Lipid panel   EKG 12-Lead   ECHOCARDIOGRAM COMPLETE   Meds ordered this encounter  Medications   atorvastatin (LIPITOR) 80 MG tablet    Sig: Take 1 tablet (80 mg total) by mouth daily.    Dispense:  90 tablet    Refill:  3    Patient Instructions  Medication Instructions:  Increase:  Atrorvastatin (Lipitor) to 80 mg once daily  *If you need a refill on your cardiac medications before your next appointment, please call your pharmacy*  Lab Work: In two months (due around 04/13/2024), please have a FASTING Lipid panel and CMET drawn (you do not need appointment; can go to any [facility] or to our new building)  Testing/Procedures: Your physician has requested that you have an echocardiogram. Echocardiography is a painless test that uses sound waves to create images of your heart. It provides your doctor with information about the size and shape of your heart and how well your heart's chambers and valves are working. This procedure takes approximately one hour. There are no restrictions for this procedure. Please do NOT wear cologne, perfume, aftershave, or lotions (deodorant is allowed). Please arrive 15 minutes prior to your appointment time.      Please report to Radiology at the [facility] Main Entrance 30 minutes early for your test.  [address] [place], [place] [address]                      How to Prepare for Your Cardiac PET/CT Stress Test:  Nothing to eat or drink, except water, 3 hours prior to arrival time.  NO caffeine/decaffeinated products, or chocolate 12 hours prior to arrival. (Please note decaffeinated beverages (teas/coffees) still contain caffeine).  If you have caffeine within 12 hours prior, the test will need to be rescheduled.  Medication instructions: Do not take nitrates (isosorbide mononitrate, Ranexa) the day before or day of test Do not take tamsulosin the day  before or morning of test Hold theophylline containing medications for 12 hours. Hold Dipyridamole 48 hours prior to the test.  Diabetic Preparation: If able to eat breakfast prior to 3 hour fasting, you may take all medications, including your insulin. Do not worry if you miss your breakfast dose of insulin - start at your next meal. If you do not eat prior to 3 hour fast-Hold all diabetes (oral and insulin) medications. Patients who wear a continuous glucose monitor MUST remove the device prior to scanning.  You may take your remaining medications with water.  NO perfume, cologne or lotion on  chest or abdomen area. FEMALES - Please avoid wearing dresses to this appointment.  Total time is 1 to 2 hours; you may want to bring reading material for the waiting time.  IF YOU THINK YOU MAY BE PREGNANT, OR ARE NURSING PLEASE INFORM THE TECHNOLOGIST.  In preparation for your appointment, medication and supplies will be purchased.  Appointment availability is limited, so if you need to cancel or reschedule, please call the Radiology Department Scheduler at [phone] 24 hours in advance to avoid a cancellation fee of $100.00  What to Expect When you Arrive:  Once you arrive and check in for your appointment, you will be taken to a preparation room within the Radiology Department.  A technologist or Nurse will obtain your medical history, verify that you are correctly prepped for the exam, and explain the procedure.  Afterwards, an IV will be started in your arm and electrodes will be placed on your skin for EKG monitoring during the stress portion of the exam. Then you will be escorted to the PET/CT scanner.  There, staff will get you positioned on the scanner and obtain a blood pressure and EKG.  During the exam, you will continue to be connected to the EKG and blood pressure machines.  A small, safe amount of a radioactive tracer will be injected in your IV to obtain a series of pictures of your  heart along with an injection of a stress agent.    After your Exam:  It is recommended that you eat a meal and drink a caffeinated beverage to counter act any effects of the stress agent.  Drink plenty of fluids for the remainder of the day and urinate frequently for the first couple of hours after the exam.  Your doctor will inform you of your test results within 7-10 business days.  For more information and frequently asked questions, please visit our website: [email or web address]  For questions about your test or how to prepare for your test, please call: Cardiac Imaging Nurse Navigators Office: [phone]   Follow-Up: At [facility], you and your health needs are our priority.  As part of our continuing mission to provide you with exceptional heart care, our providers are all part of one team.  This team includes your primary Cardiologist (physician) and Advanced Practice Providers or APPs (Physician Assistants and Nurse Practitioners) who all work together to provide you with the care you need, when you need it.  Your next appointment:   4 month(s)  Provider:   Little Ishikawa, MD          Valet parking services will be available as well.      Signed, Little Ishikawa, MD  02/12/2024 5:31 PM    [facility]

## 2024-02-16 ENCOUNTER — Telehealth: Payer: Self-pay | Admitting: Cardiology

## 2024-02-16 NOTE — Telephone Encounter (Signed)
 Patient calling in about the medication change that was made, she states that she will be going out the country tomorrow. Please advise

## 2024-02-16 NOTE — Telephone Encounter (Signed)
 Patient identification verified by 2 forms. Holly Lucky, RN    Called and spoke to patient  Patient states:   -at 4/10 OV Dr. Alda Amas increased atorvastatin to 80mg    -she is traveling to [place] tomorrow for 2 weeks   -concerned about developing side effects while away on trip   -is planning on starting increased dose in 2 weeks after traveling  Informed patient concern is understandable, it will be noted that she will start in 2 weeks  Patient has no further questions at this time

## 2024-03-08 ENCOUNTER — Encounter: Admitting: Pediatrics

## 2024-03-18 ENCOUNTER — Ambulatory Visit: Admitting: Family Medicine

## 2024-03-18 ENCOUNTER — Other Ambulatory Visit: Payer: Self-pay

## 2024-03-18 VITALS — BP 114/76 | HR 52 | Ht 60.5 in | Wt 143.0 lb

## 2024-03-18 DIAGNOSIS — G8929 Other chronic pain: Secondary | ICD-10-CM | POA: Diagnosis not present

## 2024-03-18 DIAGNOSIS — M25561 Pain in right knee: Secondary | ICD-10-CM

## 2024-03-18 DIAGNOSIS — M1711 Unilateral primary osteoarthritis, right knee: Secondary | ICD-10-CM

## 2024-03-18 NOTE — Progress Notes (Signed)
   Joanna Muck, PhD, LAT, ATC acting as a scribe for Garlan Juniper, MD.  Holly Russell is a 73 y.o. female who presents to [facility] today for R knee pain. Pt was last seen by Dr. Alease Hunter on 07/03/23 and was given a R knee steroid injection.   Today, pt reports prior steroid injection lasted for 4 months or more. She recently returned from a 2-wk trip and was doing a lot of stairs that really irritated her R knee.  Dx testing: 07/03/23 R knee XR  04/05/22 DEXA scan  Pertinent review of systems: No fevers or chills  Relevant historical information: Hypertension.   Exam:  BP 114/76   Pulse (!) 52   Ht 5' 0.5" (1.537 m)   Wt 143 lb (64.9 kg)   SpO2 98%   BMI 27.47 kg/m  General: Well Developed, well nourished, and in no acute distress.   MSK: Right knee moderate effusion normal-appearing otherwise. Normal motion.    Lab and Radiology Results  Procedure: Real-time Ultrasound Guided Injection of right knee joint superior lateral patella space Device: Philips Affiniti 50G/GE Logiq Images permanently stored and available for review in PACS Verbal informed consent obtained.  Discussed risks and benefits of procedure. Warned about infection, bleeding, hyperglycemia damage to structures among others. Patient expresses understanding and agreement Time-out conducted.   Noted no overlying erythema, induration, or other signs of local infection.   Skin prepped in a sterile fashion.   Local anesthesia: Topical Ethyl chloride.   With sterile technique and under real time ultrasound guidance: 40 mg of Kenalog and 2 mL of Marcaine injected into knee joint. Fluid seen entering the joint capsule.   Completed without difficulty   Pain immediately resolved suggesting accurate placement of the medication.   Advised to call if fevers/chills, erythema, induration, drainage, or persistent bleeding.   Images permanently stored and available for review in the  ultrasound unit.  Impression: Technically successful ultrasound guided injection.       Assessment and Plan: 73 y.o. 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  Plan for steroid injection today.  Happy to perform an injection in 3 months if needed.  If this does not last long enough would have to work on authorization for hyaluronic acid injections or Zilretta  injections.  She is going on a [place] trip in October and it may be reasonable to do an injection about 2 weeks before that trip.   PDMP not reviewed this encounter. Orders Placed This Encounter  Procedures   US  LIMITED JOINT SPACE STRUCTURES LOW RIGHT(NO LINKED CHARGES)    Reason for Exam (SYMPTOM  OR DIAGNOSIS REQUIRED):   right knee pain    Preferred imaging location?:   [facility] [place]   No orders of the defined types were placed in this encounter.    Discussed warning signs or symptoms. Please see discharge instructions. Patient expresses understanding.   The above documentation has been reviewed and is accurate and complete Garlan Juniper, M.D.

## 2024-03-18 NOTE — Patient Instructions (Addendum)
 Thank you for coming in today.   You received an injection today. Seek immediate medical attention if the joint becomes red, extremely painful, or is oozing fluid.   Check back as needed  Double Band-aid splint for that developing trigger finger

## 2024-03-25 ENCOUNTER — Ambulatory Visit: Payer: Self-pay | Admitting: Cardiology

## 2024-03-25 ENCOUNTER — Ambulatory Visit (HOSPITAL_COMMUNITY)
Admission: RE | Admit: 2024-03-25 | Discharge: 2024-03-25 | Disposition: A | Discharge status: Home / Self Care | Source: Ambulatory Visit | Attending: Cardiovascular Disease | Admitting: Cardiovascular Disease

## 2024-03-25 DIAGNOSIS — I10 Essential (primary) hypertension: Secondary | ICD-10-CM | POA: Insufficient documentation

## 2024-03-25 DIAGNOSIS — R011 Cardiac murmur, unspecified: Secondary | ICD-10-CM | POA: Diagnosis not present

## 2024-03-25 LAB — ECHOCARDIOGRAM COMPLETE
AR max vel: 1.82 cm2
AV Area VTI: 2.05 cm2
AV Area mean vel: 1.95 cm2
AV Mean grad: 6.1 mmHg
AV Peak grad: 13.5 mmHg
Ao pk vel: 1.84 m/s
Area-P 1/2: 2.79 cm2
S' Lateral: 2.6 cm

## 2024-04-16 ENCOUNTER — Encounter: Payer: Self-pay | Admitting: *Deleted

## 2024-04-17 LAB — LIPID PANEL
Chol/HDL Ratio: 1.9 ratio (ref 0.0–4.4)
Cholesterol, Total: 252 mg/dL — ABNORMAL HIGH (ref 100–199)
HDL: 135 mg/dL (ref >39)
LDL Chol Calc (NIH): 109 mg/dL — ABNORMAL HIGH (ref 0–99)
Triglycerides: 47 mg/dL (ref 0–149)
VLDL Cholesterol Cal: 8 mg/dL (ref 5–40)

## 2024-04-17 LAB — COMPREHENSIVE METABOLIC PANEL WITH GFR
ALT: 27 IU/L (ref 0–32)
AST: 20 IU/L (ref 0–40)
Albumin: 4.4 g/dL (ref 3.8–4.8)
Alkaline Phosphatase: 69 IU/L (ref 44–121)
BUN/Creatinine Ratio: 28 (ref 12–28)
BUN: 21 mg/dL (ref 8–27)
Bilirubin Total: 0.4 mg/dL (ref 0.0–1.2)
CO2: 20 mmol/L (ref 20–29)
Calcium: 9.4 mg/dL (ref 8.7–10.3)
Chloride: 105 mmol/L (ref 96–106)
Creatinine, Ser: 0.75 mg/dL (ref 0.57–1.00)
Globulin, Total: 2.1 g/dL (ref 1.5–4.5)
Glucose: 93 mg/dL (ref 70–99)
Potassium: 4.2 mmol/L (ref 3.5–5.2)
Sodium: 141 mmol/L (ref 134–144)
Total Protein: 6.5 g/dL (ref 6.0–8.5)
eGFR: 83 mL/min/{1.73_m2} (ref >59)

## 2024-04-20 ENCOUNTER — Ambulatory Visit (INDEPENDENT_AMBULATORY_CARE_PROVIDER_SITE_OTHER): Payer: Medicare Other

## 2024-04-20 VITALS — Ht 60.0 in | Wt 140.0 lb

## 2024-04-20 DIAGNOSIS — K219 Gastro-esophageal reflux disease without esophagitis: Secondary | ICD-10-CM | POA: Diagnosis not present

## 2024-04-20 DIAGNOSIS — Z0000 Encounter for general adult medical examination without abnormal findings: Secondary | ICD-10-CM

## 2024-04-20 NOTE — Progress Notes (Addendum)
 Subjective:   Holly Russell is a 73 y.o. who presents for a Medicare Wellness preventive visit.  As a reminder, Annual Wellness Visits don't include a physical exam, and some assessments may be limited, especially if this visit is performed virtually. We may recommend an in-person follow-up visit with your provider if needed.  Visit Complete: Virtual I connected with  [name] on 04/20/24 by a audio enabled telemedicine application and verified that I am speaking with the correct person using two identifiers.  Patient Location: Home  Provider Location: Home Office  I discussed the limitations of evaluation and management by telemedicine. The patient expressed understanding and agreed to proceed.  Vital Signs: Because this visit was a virtual/telehealth visit, some criteria may be missing or patient reported. Any vitals not documented were not able to be obtained and vitals that have been documented are patient reported.  VideoDeclined- This patient declined Librarian, academic. Therefore the visit was completed with audio only.  Persons Participating in Visit: Patient.  AWV Questionnaire: AWV Questionnaire: Yes Patient Medicare AWV questionnaire was completed on 04/16/24 prior to this visit.  Cardiac Risk Factors include: advanced age (>30men, >10 women);hypertension;dyslipidemia     Objective:    Today's Vitals   04/20/24 1539  Weight: 140 lb (63.5 kg)  Height: 5' (1.524 m)   Body mass index is 27.34 kg/m.     04/20/2024    3:48 PM 12/09/2023    2:05 PM 04/15/2023    4:11 PM 09/24/2022   10:51 AM 05/08/2022   12:07 PM 03/07/2022    1:10 PM 09/15/2020   11:43 AM  Advanced Directives  Does Patient Have a Medical Advance Directive? Yes Yes Yes No No No Yes  Type of Estate agent of [place];Living will Living will Healthcare Power of [place];Living will      Does patient want to make changes to medical advance directive?  No  - Patient declined    No - Patient declined   Copy of Healthcare Power of Attorney in Chart? No - copy requested  No - copy requested      Would patient like information on creating a medical advance directive?    No - Patient declined No - Patient declined      Current Medications (verified) Outpatient Encounter Medications as of 04/20/2024  Medication Sig   aspirin EC 81 MG tablet Take 81 mg by mouth every Monday, Wednesday, and Friday. Swallow whole.   atorvastatin  (LIPITOR) 80 MG tablet Take 1 tablet (80 mg total) by mouth daily.   Calcium  Carb-Cholecalciferol (CALTRATE 600+D3 PO) Take 600 mLs by mouth daily. 1-2 tablets daily   famotidine  (PEPCID ) 20 MG tablet Take 1 tablet (20 mg total) by mouth daily.   lisinopril  (ZESTRIL ) 10 MG tablet Take 1 tablet (10 mg total) by mouth daily.   zoledronic  acid (RECLAST ) 5 MG/100ML SOLN injection    vitamin B-12 (CYANOCOBALAMIN ) 1000 MCG tablet Take 1,000 mcg by mouth as needed.   No facility-administered encounter medications on file as of 04/20/2024.    Allergies (verified) Patient has no known allergies.   History: Past Medical History:  Diagnosis Date   Acid reflux    Agatston CAC score 100-199    Basal cell carcinoma    Cataract    Coronary atherosclerosis    Deviated nasal septum    Eczema    Gastroesophageal reflux disease    H/O bone density study 2019-2015   H/O echocardiogram 2015  H/O exercise stress test 3/4-5 2015   Hearing loss    High blood pressure    High cholesterol    History of chest pain    History of prediabetes    Hx of mammogram 2018-2017-2016-2015-2014   Hyperlipidemia    IBS (irritable bowel syndrome)    Intervertebral disc disease    Iron deficiency anemia    On statin therapy    Osteoarthritis    Osteopenia    unspecified  Location   SNHL (sensorineural hearing loss)    Spinal stenosis    Tinnitus of left ear    Vitamin D  deficiency    Past Surgical History:  Procedure Laterality Date    ENDOMETRIAL BIOPSY     EYE SURGERY     lazy eye when 73yo   MOHS SURGERY     for basal cell to the right of right eye   TONSILLECTOMY     Family History  Problem Relation Age of Onset   Stroke Mother 38   Basal cell carcinoma Mother    Hypertension Mother    Basal cell carcinoma Father    Hypertension Father    Varicose Veins Father    Breast cancer Paternal Aunt        65s   Cancer Paternal Aunt    Cancer Maternal Grandfather    Depression Paternal Grandmother    Heart attack Paternal Grandfather    Heart disease Paternal Grandfather    Breast cancer Cousin        30s-40s   Social History   Socioeconomic History   Marital status: Married    Spouse name: Not on file   Number of children: 2   Years of education: Not on file   Highest education level: Professional school degree (e.g., MD, DDS, DVM, JD)  Occupational History   Not on file  Tobacco Use   Smoking status: Never   Smokeless tobacco: Never  Vaping Use   Vaping status: Never Used  Substance and Sexual Activity   Alcohol use: Yes    Alcohol/week: 3.0 standard drinks of alcohol    Types: 3 Glasses of wine per week    Comment: 2-3/wk   Drug use: Never   Sexual activity: Not Currently    Partners: Male    Birth control/protection: Post-menopausal  Other Topics Concern   Not on file  Social History Narrative   Diet: Plant Based and Fish      Do you drink/ eat things with caffeine?  Yes      Marital status:  Married       2 children, 1 grand            What year were you married ? 1976      Do you live in a house, apartment,assistred living, condo, trailer, etc.)? Condo-Town House      Is it one or more stories? 2      How many persons live in your home ? 2      Do you have any pets in your home ?(please list) No      Highest Level of education completed: Law School      Current or past profession: Attorney      Do you exercise?     Yes                         Type & how often Walk-3 miles daily       ADVANCED  DIRECTIVES (Please bring copies)      Do you have a living will? Yes      Do you have a DNR form?   Yes                    If not, do you want to discuss one?       Do you have signed POA?HPOA forms?  Yes               If so, please bring to your appointment      FUNCTIONAL STATUS- To be completed by Spouse / child / Staff       Do you have difficulty bathing or dressing yourself ?  No      Do you have difficulty preparing food or eating ?  No      Do you have difficulty managing your mediation ?  No      Do you have difficulty managing your finances ?  No      Do you have difficulty affording your medication ?  No      Social Drivers of Corporate investment banker Strain: Low Risk  (04/16/2024)   Overall Financial Resource Strain (CARDIA)    Difficulty of Paying Living Expenses: Not hard at all  Food Insecurity: No Food Insecurity (04/16/2024)   Hunger Vital Sign    Worried About Running Out of Food in the Last Year: Never true    Ran Out of Food in the Last Year: Never true  Transportation Needs: No Transportation Needs (04/16/2024)   PRAPARE - Administrator, Civil Service (Medical): No    Lack of Transportation (Non-Medical): No  Physical Activity: Sufficiently Active (04/16/2024)   Exercise Vital Sign    Days of Exercise per Week: 6 days    Minutes of Exercise per Session: 60 min  Stress: Patient Declined (04/16/2024)   [employer] of Occupational Health - Occupational Stress Questionnaire    Feeling of Stress: Patient declined  Social Connections: Unknown (04/16/2024)   Social Connection and Isolation Panel    Frequency of Communication with Friends and Family: Patient declined    Frequency of Social Gatherings with Friends and Family: Patient declined    Attends Religious Services: Patient declined    Database administrator or Organizations: Patient declined    Attends Engineer, structural: Not on file    Marital Status:  Married    Tobacco Counseling Counseling given: Not Answered    Clinical Intake:  Pre-visit preparation completed: Yes  Pain : No/denies pain     BMI - recorded: 27.34 Nutritional Status: BMI 25 -29 Overweight Nutritional Risks: None  Lab Results  Component Value Date   HGBA1C 5.6 11/17/2023   HGBA1C 5.3 05/09/2023   HGBA1C 5.8 11/08/2022     How often do you need to have someone help you when you read instructions, pamphlets, or other written materials from your doctor or pharmacy?: 1 - Never  Interpreter Needed?: No  Information entered by :: Caitland Porchia, LPN   Activities of Daily Living     04/20/2024    3:50 PM  In your present state of health, do you have any difficulty performing the following activities:  Hearing? 0  Vision? 0  Difficulty concentrating or making decisions? 0  Walking or climbing stairs? 0  Dressing or bathing? 0  Doing errands, shopping? 0  Preparing Food and eating ? N  Using the Toilet? N  In the past six months, have you accidently leaked urine? N  Do you have problems with loss of bowel control? N  Managing your Medications? N  Managing your Finances? N  Housekeeping or managing your Housekeeping? N    Patient Care Team: Christel Cousins, MD as PCP - General (Family Medicine) Wendie Hamburg, MD as PCP - Cardiology (Cardiology) Romaine Closs, MD as Consulting Physician (Obstetrics and Gynecology)  I have updated your Care Teams any recent Medical Services you may have received from other providers in the past year.     Assessment:   This is a routine wellness examination for Holly Russell.  Hearing/Vision screen Hearing Screening - Comments:: Pt denies any hearing issues  Vision Screening - Comments:: Wears rx glasses - up to date with routine eye exams with Dr Rudine Cos     Goals Addressed   None    Depression Screen     04/20/2024    3:51 PM 11/17/2023    9:00 AM 11/03/2023    3:19 PM 05/09/2023     9:27 AM 04/15/2023    4:10 PM 03/07/2022    1:12 PM 10/18/2021    9:15 AM  PHQ 2/9 Scores  PHQ - 2 Score 0 0 0 0 0 0 0  PHQ- 9 Score  0  0       Fall Risk     04/20/2024    3:52 PM 11/17/2023    8:59 AM 11/03/2023    3:19 PM 05/09/2023    9:27 AM 04/15/2023    1:14 PM  Fall Risk   Falls in the past year? 0 0 0 1 0  Number falls in past yr: 0 0 0 0 0  Injury with Fall? 0 0 0 0 0  Risk for fall due to : No Fall Risks No Fall Risks No Fall Risks Impaired balance/gait Impaired vision;Impaired balance/gait  Follow up Falls prevention discussed Falls evaluation completed Falls evaluation completed Falls prevention discussed Falls prevention discussed    MEDICARE RISK AT HOME:  Medicare Risk at Home Any stairs in or around the home?: Yes If so, are there any without handrails?: No Home free of loose throw rugs in walkways, pet beds, electrical cords, etc?: Yes Adequate lighting in your home to reduce risk of falls?: Yes Life alert?: No Use of a cane, walker or w/c?: No Grab bars in the bathroom?: Yes Shower chair or bench in shower?: Yes Elevated toilet seat or a handicapped toilet?: Yes  TIMED UP AND GO:  Was the test performed?  No  Cognitive Function: 6CIT completed    03/07/2022    1:13 PM  MMSE - Mini Mental State Exam  Orientation to time 5  Orientation to Place 5  Registration 3  Attention/ Calculation 5  Recall 2  Language- name 2 objects 2  Language- repeat 1  Language- follow 3 step command 3  Language- read & follow direction 1  Write a sentence 1  Copy design 1  Total score 29        04/20/2024    3:56 PM  6CIT Screen  What Year? 0 points  What month? 0 points  What time? 0 points  Count back from 20 0 points  Months in reverse 0 points  Repeat phrase 0 points  Total Score 0 points    Immunizations Immunization History  Administered Date(s) Administered   Fluad Quad(high Dose 65+) 07/11/2021   Influenza, High Dose Seasonal PF 07/27/2020,  07/24/2022  Influenza-Unspecified 09/10/2014, 08/19/2015, 08/10/2016, 09/02/2017, 08/14/2018   PFIZER(Purple Top)SARS-COV-2 Vaccination 11/27/2019, 12/22/2019, 07/27/2020, 02/09/2021, 07/24/2022   Pfizer Covid-19 Vaccine Bivalent Booster 62yrs & up 07/11/2021, 03/04/2022   Pfizer(Comirnaty)Fall Seasonal Vaccine 12 years and older 01/22/2023   Pneumococcal Conjugate-13 08/19/2015   Pneumococcal Polysaccharide-23 11/27/2016   Respiratory Syncytial Virus Vaccine,Recomb Aduvanted(Arexvy) 07/24/2022   Tdap 05/31/2009, 07/06/2023   Unspecified SARS-COV-2 Vaccination 07/06/2023   Zoster Recombinant(Shingrix) 07/14/2019, 09/15/2019   Zoster, Live 11/29/2014    Screening Tests Health Maintenance  Topic Date Due   Colonoscopy  01/03/2023   COVID-19 Vaccine (10 - Pfizer risk 2024-25 season) 01/03/2024   INFLUENZA VACCINE  06/04/2024   Medicare Annual Wellness (AWV)  04/20/2025   MAMMOGRAM  02/02/2026   DTaP/Tdap/Td (3 - Td or Tdap) 07/05/2033   Pneumococcal Vaccine: 50+ Years  Completed   DEXA SCAN  Completed   Hepatitis C Screening  Completed   Zoster Vaccines- Shingrix  Completed   HPV VACCINES  Aged Out   Meningococcal B Vaccine  Aged Out    Health Maintenance  Health Maintenance Due  Topic Date Due   Colonoscopy  01/03/2023   COVID-19 Vaccine (10 - Pfizer risk 2024-25 season) 01/03/2024   Health Maintenance Items Addressed: See Nurse Notes at the end of this note  Additional Screening:  Vision Screening: Recommended annual ophthalmology exams for early detection of glaucoma and other disorders of the eye. Would you like a referral to an eye doctor? No    Dental Screening: Recommended annual dental exams for proper oral hygiene  Community Resource Referral / Chronic Care Management: CRR required this visit?  No   CCM required this visit?  No   Plan:    I have personally reviewed and noted the following in the patient's chart:   Medical and social history Use of  alcohol, tobacco or illicit drugs  Current medications and supplements including opioid prescriptions. Patient is not currently taking opioid prescriptions. Functional ability and status Nutritional status Physical activity Advanced directives List of other physicians Hospitalizations, surgeries, and ER visits in previous 12 months Vitals Screenings to include cognitive, depression, and falls Referrals and appointments  In addition, I have reviewed and discussed with patient certain preventive protocols, quality metrics, and best practice recommendations. A written personalized care plan for preventive services as well as general preventive health recommendations were provided to patient.   Bruno Capri, LPN   02/10/8118   After Visit Summary: (MyChart) Due to this being a telephonic visit, the after visit summary with patients personalized plan was offered to patient via MyChart   Notes: Pt stated colonoscopy cancelled until results are back from stress test per cardiologist

## 2024-04-20 NOTE — Patient Instructions (Signed)
 Holly Russell , Thank you for taking time out of your busy schedule to complete your Annual Wellness Visit with me. I enjoyed our conversation and look forward to speaking with you again next year. I, as well as your care team,  appreciate your ongoing commitment to your health goals. Please review the following plan we discussed and let me know if I can assist you in the future. Your Game plan/ To Do List    Referrals: If you haven't heard from the office you've been referred to, please reach out to them at the phone provided.   Follow up Visits: Next Medicare AWV with our clinical staff: 04/25/25   Have you seen your provider in the last 6 months (3 months if uncontrolled diabetes)? No Next Office Visit with your provider: 05/17/24  Clinician Recommendations:  Aim for 30 minutes of exercise or brisk walking, 6-8 glasses of water, and 5 servings of fruits and vegetables each day.       This is a list of the screening recommended for you and due dates:  Health Maintenance  Topic Date Due   Colon Cancer Screening  01/03/2023   COVID-19 Vaccine (10 - Pfizer risk 2024-25 season) 01/03/2024   Flu Shot  06/04/2024   Medicare Annual Wellness Visit  04/20/2025   Mammogram  02/02/2026   DTaP/Tdap/Td vaccine (3 - Td or Tdap) 07/05/2033   Pneumococcal Vaccine for age over 61  Completed   DEXA scan (bone density measurement)  Completed   Hepatitis C Screening  Completed   Zoster (Shingles) Vaccine  Completed   HPV Vaccine  Aged Out   Meningitis B Vaccine  Aged Out    Advanced directives: (Copy Requested) Please bring a copy of your health care power of attorney and living will to the office to be added to your chart at your convenience. You can mail to [facility] [address] [place], [place] [address] or email to [email or web address] Advance Care Planning is important because it:  [x]  Makes sure you receive the medical care that is consistent with your values, goals, and  preferences  [x]  It provides guidance to your family and loved ones and reduces their decisional burden about whether or not they are making the right decisions based on your wishes.  Follow the link provided in your after visit summary or read over the paperwork we have mailed to you to help you started getting your Advance Directives in place. If you need assistance in completing these, please reach out to us  so that we can help you!  See attachments for Preventive Care and Fall Prevention Tips.

## 2024-04-21 NOTE — Telephone Encounter (Signed)
 Since she only started the increased dose of atorvastatin  in May, we can hold off on the Zetia for now.  Recommend repeat fasting lipid panel in 2 months

## 2024-04-23 ENCOUNTER — Other Ambulatory Visit: Payer: Self-pay | Admitting: *Deleted

## 2024-04-23 DIAGNOSIS — I2510 Atherosclerotic heart disease of native coronary artery without angina pectoris: Secondary | ICD-10-CM

## 2024-04-23 DIAGNOSIS — E7800 Pure hypercholesterolemia, unspecified: Secondary | ICD-10-CM

## 2024-04-23 NOTE — Progress Notes (Signed)
 Lab ordered place for Lipid panel in 2 months per Dr. Alda Amas and patient made aware via voicemail. Left message to call office for any question

## 2024-05-10 ENCOUNTER — Encounter (HOSPITAL_COMMUNITY): Payer: Self-pay

## 2024-05-11 ENCOUNTER — Ambulatory Visit (HOSPITAL_COMMUNITY)
Admission: RE | Admit: 2024-05-11 | Discharge: 2024-05-11 | Disposition: A | Discharge status: Home / Self Care | Source: Ambulatory Visit | Attending: Cardiology | Admitting: Cardiology

## 2024-05-11 DIAGNOSIS — R072 Precordial pain: Secondary | ICD-10-CM | POA: Diagnosis present

## 2024-05-11 LAB — NM PET CT CARDIAC PERFUSION MULTI W/ABSOLUTE BLOODFLOW
MBFR: 2.33
Nuc Rest EF: 63 %
Nuc Stress EF: 73 %
Rest MBF: 0.73 ml/g/min
Rest Nuclear Isotope Dose: 16.5 mCi
ST Depression (mm): 0 mm
Stress MBF: 1.7 ml/g/min
Stress Nuclear Isotope Dose: 16.5 mCi

## 2024-05-11 MED ORDER — RUBIDIUM RB82 GENERATOR (RUBYFILL)
16.4700 | PACK | Freq: Once | INTRAVENOUS | Status: AC
  Administered 2024-05-11: 16.47 via INTRAVENOUS

## 2024-05-11 MED ORDER — CAFFEINE CITRATE BASE COMPONENT 10 MG/ML IV SOLN
INTRAVENOUS | Status: AC
  Filled 2024-05-11: qty 3

## 2024-05-11 MED ORDER — REGADENOSON 0.4 MG/5ML IV SOLN
0.4000 mg | Freq: Once | INTRAVENOUS | Status: AC
  Administered 2024-05-11: 0.4 mg via INTRAVENOUS

## 2024-05-11 MED ORDER — RUBIDIUM RB82 GENERATOR (RUBYFILL)
16.4800 | PACK | Freq: Once | INTRAVENOUS | Status: AC
  Administered 2024-05-11: 16.48 via INTRAVENOUS

## 2024-05-11 MED ORDER — REGADENOSON 0.4 MG/5ML IV SOLN
INTRAVENOUS | Status: AC
  Filled 2024-05-11: qty 5

## 2024-05-11 NOTE — Progress Notes (Signed)
 Pt. Tolerated lexi scan well.

## 2024-05-12 ENCOUNTER — Telehealth: Payer: Self-pay | Admitting: *Deleted

## 2024-05-12 NOTE — Telephone Encounter (Signed)
 Patient called and inquiring about lab work. Made patient aware that lab work should be done around week of 8/15.Made patient aware that she has appt on 8/18 with Dr. Kate. Made patient aware that she should be fasting for those labs and continue to hold Zetia as requested  by Dr. Kate. Understanding verbalized.

## 2024-05-17 ENCOUNTER — Encounter: Payer: Self-pay | Admitting: Family Medicine

## 2024-05-17 ENCOUNTER — Ambulatory Visit (INDEPENDENT_AMBULATORY_CARE_PROVIDER_SITE_OTHER): Payer: Medicare Other | Admitting: Family Medicine

## 2024-05-17 VITALS — BP 128/74 | HR 53 | Temp 98.2°F | Ht 60.0 in | Wt 140.0 lb

## 2024-05-17 DIAGNOSIS — I10 Essential (primary) hypertension: Secondary | ICD-10-CM

## 2024-05-17 DIAGNOSIS — R7303 Prediabetes: Secondary | ICD-10-CM | POA: Diagnosis not present

## 2024-05-17 DIAGNOSIS — K219 Gastro-esophageal reflux disease without esophagitis: Secondary | ICD-10-CM | POA: Diagnosis not present

## 2024-05-17 DIAGNOSIS — M810 Age-related osteoporosis without current pathological fracture: Secondary | ICD-10-CM | POA: Diagnosis not present

## 2024-05-17 DIAGNOSIS — E7800 Pure hypercholesterolemia, unspecified: Secondary | ICD-10-CM

## 2024-05-17 MED ORDER — FAMOTIDINE 20 MG PO TABS: 20.0000 mg | ORAL_TABLET | Freq: Two times a day (BID) | ORAL | 3 refills | Status: AC

## 2024-05-17 MED ORDER — LISINOPRIL 10 MG PO TABS: 10.0000 mg | ORAL_TABLET | Freq: Every day | ORAL | 3 refills | Status: DC

## 2024-05-17 NOTE — Patient Instructions (Addendum)
 It was very nice to see you today!  Change batteries in bp cuff  Take the B12 2-3x/week.   PLEASE NOTE:  If you had any lab tests please let us  know if you have not heard back within a few days. You may see your results on MyChart before we have a chance to review them but we will give you a call once they are reviewed by us . If we ordered any referrals today, please let us  know if you have not heard from their office within the next week.   Please try these tips to maintain a healthy lifestyle:  Eat most of your calories during the day when you are active. Eliminate processed foods including packaged sweets (pies, cakes, cookies), reduce intake of potatoes, white bread, white pasta, and white rice. Look for whole grain options, oat flour or almond flour.  Each meal should contain half fruits/vegetables, one quarter protein, and one quarter carbs (no bigger than a computer mouse).  Cut down on sweet beverages. This includes juice, soda, and sweet tea. Also watch fruit intake, though this is a healthier sweet option, it still contains natural sugar! Limit to 3 servings daily.  Drink at least 1 glass of water with each meal and aim for at least 8 glasses per day  Exercise at least 150 minutes every week.

## 2024-05-17 NOTE — Progress Notes (Signed)
 Subjective:     Patient ID: Holly Russell, female    DOB: November 09, 1950, 73 y.o.   MRN: [id number]  Chief Complaint  Patient presents with   Hypertension    Pt seen today for 6 month follow up for hypertension and cholesterol;  pt has brought in bp log from home;     HPI Hh b12, predm Discussed the use of AI scribe software for clinical note transcription with the patient, who gave verbal consent to proceed.  History of Present Illness Holly Russell is a 73 year old female with hypertension, hyperlipidemia, and prediabetes who presents for follow-up on her blood pressure, cholesterol, and B12 levels.  She underwent a chemical stress test on July 8th, which showed normal results except for some calcification and aortic atherosclerosis.  Her low-density lipoprotein (LDL) cholesterol has decreased since starting atorvastatin  80 mg daily on May 1st. She is scheduled for another cholesterol panel in August. Her cholesterol was below 70 only once when she had lost significant weight.  She is currently taking lisinopril  10 mg daily for hypertension. Blood pressure readings vary from 120s to 160s systolic, with diastolic readings in the 60s to 70s. No dizziness, lightheadedness, chest pain, or shortness of breath. She experiences anxiety about taking her blood pressure at home, which sometimes results in higher readings. She suspects her home blood pressure machine might be malfunctioning as it tightens excessively and gives inconsistent readings.  She is managing her prediabetes by monitoring her diet, particularly reducing sugar intake. Her blood sugar levels were fine in her last panel, attributing previous higher readings to holiday indulgences. She monitors starch intake and opts for whole grain pasta, sweet potatoes, and brown rice over their white counterparts.  She has increased her famotidine  to twice daily, which has reduced her chest pain, suspected to be related to acid reflux. She  avoids arugula due to past experiences of food getting stuck in her throat.  She is not currently taking B12 supplements regularly. She manages her calcium  intake through dietary sources like lactose-free milk and yogurt, as she prefers to avoid supplements due to constipation concerns.  She follows a pescetarian diet, occasionally consuming chicken. She drinks alcohol less than one drink per day on average and has reduced her intake over time. She experiences lactose intolerance but manages it with lactose-free products.    Health Maintenance Due  Topic Date Due   COVID-19 Vaccine (10 - Pfizer risk 2024-25 season) 01/03/2024    Past Medical History:  Diagnosis Date   Acid reflux    Agatston CAC score 100-199    Basal cell carcinoma    Cataract    Coronary atherosclerosis    Deviated nasal septum    Eczema    Gastroesophageal reflux disease    H/O bone density study 2019-2015   H/O echocardiogram 2015   H/O exercise stress test 3/4-5 2015   Hearing loss    High blood pressure    High cholesterol    History of chest pain    History of prediabetes    Hx of mammogram 2018-2017-2016-2015-2014   Hyperlipidemia    IBS (irritable bowel syndrome)    Intervertebral disc disease    Iron deficiency anemia    On statin therapy    Osteoarthritis    Osteopenia    unspecified  Location   SNHL (sensorineural hearing loss)    Spinal stenosis    Tinnitus of left ear    Vitamin D  deficiency  Past Surgical History:  Procedure Laterality Date   ENDOMETRIAL BIOPSY     EYE SURGERY     lazy eye when 73yo   MOHS SURGERY     for basal cell to the right of right eye   TONSILLECTOMY       Current Outpatient Medications:    aspirin EC 81 MG tablet, Take 81 mg by mouth every Monday, Wednesday, and Friday. Swallow whole., Disp: , Rfl:    atorvastatin  (LIPITOR) 80 MG tablet, Take 1 tablet (80 mg total) by mouth daily., Disp: 90 tablet, Rfl: 3   Calcium  Carb-Cholecalciferol (CALTRATE  600+D3 PO), Take 600 mLs by mouth daily. 1-2 tablets daily, Disp: , Rfl:    vitamin B-12 (CYANOCOBALAMIN ) 1000 MCG tablet, Take 1,000 mcg by mouth as needed., Disp: , Rfl:    zoledronic  acid (RECLAST ) 5 MG/100ML SOLN injection, , Disp: , Rfl:    famotidine  (PEPCID ) 20 MG tablet, Take 1 tablet (20 mg total) by mouth 2 (two) times daily., Disp: 180 tablet, Rfl: 3   lisinopril  (ZESTRIL ) 10 MG tablet, Take 1 tablet (10 mg total) by mouth daily., Disp: 90 tablet, Rfl: 3   triamcinolone  ointment (KENALOG) 0.1 %, 1 Application. (Patient not taking: Reported on 05/17/2024), Disp: , Rfl:   No Known Allergies ROS neg/noncontributory except as noted HPI/below      Objective:     BP 128/74 (BP Location: Left Arm, Patient Position: Sitting)   Pulse (!) 53   Temp 98.2 F (36.8 C) (Temporal)   Ht 5' (1.524 m)   Wt 140 lb (63.5 kg)   SpO2 96%   BMI 27.34 kg/m  Wt Readings from Last 3 Encounters:  05/17/24 140 lb (63.5 kg)  04/20/24 140 lb (63.5 kg)  03/18/24 143 lb (64.9 kg)    Physical Exam   Gen: WDWN NAD HEENT: NCAT, conjunctiva not injected, sclera nonicteric NECK:  supple, no thyromegaly, no nodes, no carotid bruits CARDIAC: RRR, S1S2+, no murmur. DP 2+B LUNGS: CTAB. No wheezes ABDOMEN:  BS+, soft, NTND, No HSM, no masses EXT:  no edema MSK: no gross abnormalities.  NEURO: A&O x3.  CN II-XII intact.  PSYCH: normal mood. Good eye contact     Assessment & Plan:  Age-related osteoporosis without current pathological fracture  Essential (primary) hypertension -     Lisinopril ; Take 1 tablet (10 mg total) by mouth daily.  Dispense: 90 tablet; Refill: 3  Gastroesophageal reflux disease without esophagitis -     Famotidine ; Take 1 tablet (20 mg total) by mouth 2 (two) times daily.  Dispense: 180 tablet; Refill: 3  Prediabetes  Pure hypercholesterolemia  Assessment and Plan Assessment & Plan Hypertension  - chronic. controlled Blood pressure readings range from 120s to 180s  systolic, with diastolic in the 60s to 70s. She experiences no dizziness, lightheadedness, chest pain, or shortness of breath. Anxiety may be related to self-monitoring. Concerns exist about the accuracy of the home blood pressure monitor due to potential battery issues and cuff settings. She is on lisinopril  10 mg daily. Lisinopril  will not be increased due to low office readings and potential inaccuracies at home. Change batteries and check cuff settings on the blood pressure monitor. Continue home monitoring and refill lisinopril  prescription.  Hyperlipidemia   She takes atorvastatin  80 mg daily. A recent stress test showed calcifications but no blockages. The LDL goal is below 70 mg/dL, previously achieved when her weight was lower. The next lipid panel is scheduled for August. Monitor LDL levels and  consider additional medication if LDL remains above 70 mg/dL. Continue atorvastatin  80 mg daily and repeat lipid panel in August.  Prediabetes   She is aware of the need for dietary modifications, including reducing sugar and starch intake. Recent blood work was normal, but a previous A1c was higher, likely due to holiday diet. Monitor dietary intake and remain aware of starches and sugars. Continue dietary modifications.  Gastroesophageal Reflux Disease (GERD)   Increasing famotidine  to twice daily has reduced chest pain. No recent episodes of food impaction. She avoids arugula due to choking risk. Continue current famotidine  dosage and dietary precautions. Avoid arugula.  Vitamin B12 Deficiency   Not currently taking B12 supplements. Levels were good in January but have been decreasing, likely due to a pescetarian diet and acid suppression therapy. Supplement B12 a couple of times a week to prevent deficiency due to dietary restrictions and medication effects.  Osteoporosis  - on reclast  She prefers dietary calcium  intake over supplements due to constipation risk. Uses lactose-free milk and yogurt  for calcium . Balance dietary intake with potential need for supplements and manage constipation with Colace if needed. Continue dietary calcium  intake and consider taking Colace on days when calcium  supplements are taken.  General Health Maintenance   She inquires about Stevia and alcohol consumption. Stevia is safe in moderation. Alcohol intake is less than one drink per day on average, which is acceptable. Maintain moderation in consumption of Stevia and alcohol. Limit alcohol intake to less than two drinks in one sitting and use Stevia in moderation.    Return in about 6 months (around 11/17/2024) for cholesterol, HTN.  Jenkins CHRISTELLA Carrel, MD

## 2024-06-16 ENCOUNTER — Other Ambulatory Visit: Payer: Self-pay | Admitting: *Deleted

## 2024-06-16 DIAGNOSIS — I2510 Atherosclerotic heart disease of native coronary artery without angina pectoris: Secondary | ICD-10-CM

## 2024-06-16 DIAGNOSIS — E7800 Pure hypercholesterolemia, unspecified: Secondary | ICD-10-CM

## 2024-06-17 ENCOUNTER — Ambulatory Visit: Payer: Self-pay | Admitting: Cardiology

## 2024-06-17 DIAGNOSIS — E7800 Pure hypercholesterolemia, unspecified: Secondary | ICD-10-CM

## 2024-06-17 LAB — LIPID PANEL
Chol/HDL Ratio: 1.7 ratio (ref 0.0–4.4)
Cholesterol, Total: 230 mg/dL — ABNORMAL HIGH (ref 100–199)
HDL: 135 mg/dL (ref >39)
LDL Chol Calc (NIH): 87 mg/dL (ref 0–99)
Triglycerides: 46 mg/dL (ref 0–149)
VLDL Cholesterol Cal: 8 mg/dL (ref 5–40)

## 2024-06-20 NOTE — Progress Notes (Unsigned)
 Cardiology Office Note:    Date:  06/21/2024   ID:  Holly Russell, DOB June 25, 1951, MRN [id number]  PCP:  Wendolyn Jenkins Jansky, MD  Cardiologist:  Lonni LITTIE Nanas, MD  Electrophysiologist:  None   Referring MD: Wendolyn Jenkins Jansky, MD   Chief Complaint  Patient presents with   Coronary Artery Disease    History of Present Illness:    Holly Russell is a 73 y.o. female with a hx of CAD, hypertension, hyperlipidemia, prediabetes, GERD who presents for follow-up.  She was referred by Dr Wendolyn for evaluation of CAD, initially seen 02/12/2024.  CT chest 11/2023 noted to have coronary calcifications.   She reports she previously followed with cardiologist in [place] for her hyperlipidemia.  Did report atypical chest pain.  Echocardiogram 03/25/2024 showed normal biventricular function, mild mitral regurgitation, mild biatrial enlargement.  Stress PET 05/11/2024 showed normal perfusion, LVEF 63%, normal myocardial blood flow reserve, moderate coronary calcifications.  Since last clinic visit, she reports she is doing well.  Reports BP has been elevated, SBP has been 120s to 160s on home log.  Has also noted slow heart rate, down to 40s on her BP monitor.  States that her chest pain improved with increasing famotidine .  She denies any dyspnea, lightheadedness, syncope, lower extremity edema, or palpitations.  She walks for 3 miles most days, denies any exertional symptoms.   Past Medical History:  Diagnosis Date   Acid reflux    Agatston CAC score 100-199    Basal cell carcinoma    Cataract    Coronary atherosclerosis    Deviated nasal septum    Eczema    Gastroesophageal reflux disease    H/O bone density study 2019-2015   H/O echocardiogram 2015   H/O exercise stress test 3/4-5 2015   Hearing loss    High blood pressure    High cholesterol    History of chest pain    History of prediabetes    Hx of mammogram 2018-2017-2016-2015-2014   Hyperlipidemia    IBS (irritable bowel syndrome)     Intervertebral disc disease    Iron deficiency anemia    On statin therapy    Osteoarthritis    Osteopenia    unspecified  Location   SNHL (sensorineural hearing loss)    Spinal stenosis    Tinnitus of left ear    Vitamin D  deficiency     Past Surgical History:  Procedure Laterality Date   ENDOMETRIAL BIOPSY     EYE SURGERY     lazy eye when 73yo   MOHS SURGERY     for basal cell to the right of right eye   TONSILLECTOMY      Current Medications: Current Meds  Medication Sig   aspirin EC 81 MG tablet Take 81 mg by mouth every Monday, Wednesday, and Friday. Swallow whole.   atorvastatin  (LIPITOR) 80 MG tablet Take 1 tablet (80 mg total) by mouth daily.   Calcium  Carb-Cholecalciferol (CALTRATE 600+D3 PO) Take 600 mLs by mouth daily. 1-2 tablets daily   ezetimibe  (ZETIA ) 10 MG tablet Take 1 tablet (10 mg total) by mouth daily.   famotidine  (PEPCID ) 20 MG tablet Take 1 tablet (20 mg total) by mouth 2 (two) times daily.   lisinopril  (ZESTRIL ) 20 MG tablet Take 1 tablet (20 mg total) by mouth daily.   vitamin B-12 (CYANOCOBALAMIN ) 1000 MCG tablet Take 1,000 mcg by mouth as needed.   zoledronic  acid (RECLAST ) 5 MG/100ML SOLN injection    [  DISCONTINUED] ezetimibe  (ZETIA ) 10 MG tablet Take 1 tablet (10 mg total) by mouth daily.   [DISCONTINUED] lisinopril  (ZESTRIL ) 10 MG tablet Take 1 tablet (10 mg total) by mouth daily.   [DISCONTINUED] lisinopril  (ZESTRIL ) 20 MG tablet Take 1 tablet (20 mg total) by mouth daily.     Allergies:   Patient has no known allergies.   Social History   Socioeconomic History   Marital status: Married    Spouse name: Not on file   Number of children: 2   Years of education: Not on file   Highest education level: Professional school degree (e.g., MD, DDS, DVM, JD)  Occupational History   Not on file  Tobacco Use   Smoking status: Never   Smokeless tobacco: Never  Vaping Use   Vaping status: Never Used  Substance and Sexual Activity   Alcohol  use: Yes    Alcohol/week: 3.0 standard drinks of alcohol    Types: 3 Glasses of wine per week    Comment: 2-3/wk   Drug use: Never   Sexual activity: Not Currently    Partners: Male    Birth control/protection: Post-menopausal  Other Topics Concern   Not on file  Social History Narrative   Diet: Plant Based and Fish      Do you drink/ eat things with caffeine ?  Yes      Marital status:  Married       2 children, 1 grand            What year were you married ? 1976      Do you live in a house, apartment,assistred living, condo, trailer, etc.)? Condo-Town House      Is it one or more stories? 2      How many persons live in your home ? 2      Do you have any pets in your home ?(please list) No      Highest Level of education completed: Law School      Current or past profession: Attorney      Do you exercise?     Yes                         Type & how often Walk-3 miles daily      ADVANCED DIRECTIVES (Please bring copies)      Do you have a living will? Yes      Do you have a DNR form?   Yes                    If not, do you want to discuss one?       Do you have signed POA?HPOA forms?  Yes               If so, please bring to your appointment      FUNCTIONAL STATUS- To be completed by Spouse / child / Staff       Do you have difficulty bathing or dressing yourself ?  No      Do you have difficulty preparing food or eating ?  No      Do you have difficulty managing your mediation ?  No      Do you have difficulty managing your finances ?  No      Do you have difficulty affording your medication ?  No      Social Drivers of Corporate investment banker Strain: Low  Risk  (04/16/2024)   Overall Financial Resource Strain (CARDIA)    Difficulty of Paying Living Expenses: Not hard at all  Food Insecurity: No Food Insecurity (04/16/2024)   Hunger Vital Sign    Worried About Running Out of Food in the Last Year: Never true    Ran Out of Food in the Last Year: Never true   Transportation Needs: No Transportation Needs (04/16/2024)   PRAPARE - Administrator, Civil Service (Medical): No    Lack of Transportation (Non-Medical): No  Physical Activity: Sufficiently Active (04/16/2024)   Exercise Vital Sign    Days of Exercise per Week: 6 days    Minutes of Exercise per Session: 60 min  Stress: Patient Declined (04/16/2024)   [employer] of Occupational Health - Occupational Stress Questionnaire    Feeling of Stress: Patient declined  Social Connections: Unknown (04/16/2024)   Social Connection and Isolation Panel    Frequency of Communication with Friends and Family: Patient declined    Frequency of Social Gatherings with Friends and Family: Patient declined    Attends Religious Services: Patient declined    Database administrator or Organizations: Patient declined    Attends Engineer, structural: Not on file    Marital Status: Married     Family History: The patient's family history includes Basal cell carcinoma in her father and mother; Breast cancer in her cousin and paternal aunt; Cancer in her maternal grandfather and paternal aunt; Depression in her paternal grandmother; Heart attack in her paternal grandfather; Heart disease in her paternal grandfather; Hypertension in her father and mother; Stroke (age of onset: 65) in her mother; Varicose Veins in her father.  ROS:   Please see the history of present illness.     All other systems reviewed and are negative.  EKGs/Labs/Other Studies Reviewed:    The following studies were reviewed today:   EKG:   02/12/2024: Sinus bradycardia, rate 55, no ST abnormalities  Recent Labs: 07/04/2023: Magnesium 2.1 11/17/2023: Hemoglobin 13.4; Platelets 217.0 04/16/2024: ALT 27; BUN 21; Creatinine, Ser 0.75; Potassium 4.2; Sodium 141  Recent Lipid Panel    Component Value Date/Time   CHOL 230 (H) 06/16/2024 0832   TRIG 46 06/16/2024 0832   HDL 135 06/16/2024 0832   CHOLHDL 1.7  06/16/2024 0832   CHOLHDL 2 11/17/2023 0935   VLDL 8.8 11/17/2023 0935   LDLCALC 87 06/16/2024 0832   LDLCALC 112 (H) 04/29/2022 0813    Physical Exam:    VS:  BP 136/72 (BP Location: Left Arm, Patient Position: Sitting, Cuff Size: Normal)   Pulse 76   Ht 5' 6 (1.676 m)   Wt 145 lb 6.4 oz (66 kg)   BMI 23.47 kg/m     Wt Readings from Last 3 Encounters:  06/21/24 145 lb 6.4 oz (66 kg)  05/17/24 140 lb (63.5 kg)  04/20/24 140 lb (63.5 kg)     GEN:  WeShe w nourished, well developed in no acute distress HEENT: Normal NECK: No JVD; No carotid bruits LYMPHATICS: No lymphadenopathy CARDIAC: RRR, 2/6 systolic murmur RESPIRATORY:  Clear to auscultation without rales, wheezing or rhonchi  ABDOMEN: Soft, non-tender, non-distended MUSCULOSKELETAL:  No edema; No deformity  SKIN: Warm and dry NEUROLOGIC:  Alert and oriented x 3 PSYCHIATRIC:  Normal affect   ASSESSMENT:    1. Bradycardia   2. Coronary artery disease involving native coronary artery of native heart without angina pectoris   3. Essential (primary) hypertension   4.  Hyperlipidemia, unspecified hyperlipidemia type     PLAN:    CAD: Reported atypical chest pain.  Echocardiogram 03/25/2024 showed normal biventricular function, mild mitral regurgitation, mild biatrial enlargement.  Stress PET 05/11/2024 showed normal perfusion, LVEF 63%, normal myocardial blood flow reserve, moderate coronary calcifications.   - Continue atorvastatin  80 mg daily.  LDL 87 on 06/16/2024.  Add Zetia  10 mg daily for goal LDL less than 70 - Chest pain improved with increasing famotidine , suspect likely GERD  Hypertension: on lisinopril  10 mg daily.  BP mildly elevated in clinic today and has been elevated on home log, will increase lisinopril  to 20 mg daily.  Check BMET in 1 week.  Asked to check BP daily for next 2 weeks and let us  know results  Hyperlipidemia: on atorvastatin  40 mg daily.  LDL 127 on 11/17/2023.  Given significant coronary  calcifications on CT chest 11/2023, increase atorvastatin  80 mg daily.  LDL 87 on 06/16/2024.  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  Concerns regarding medicines are outlined above.  Orders Placed This Encounter  Procedures   Basic Metabolic Panel (BMET)   LONG TERM MONITOR (3-14 DAYS)   Meds ordered this encounter  Medications   DISCONTD: lisinopril  (ZESTRIL ) 20 MG tablet    Sig: Take 1 tablet (20 mg total) by mouth daily.    Dispense:  90 tablet    Refill:  3   DISCONTD: ezetimibe  (ZETIA ) 10 MG tablet    Sig: Take 1 tablet (10 mg total) by mouth daily.    Dispense:  90 tablet    Refill:  3   ezetimibe  (ZETIA ) 10 MG tablet    Sig: Take 1 tablet (10 mg total) by mouth daily.    Dispense:  90 tablet    Refill:  3   lisinopril  (ZESTRIL ) 20 MG tablet    Sig: Take 1 tablet (20 mg total) by mouth daily.    Dispense:  90 tablet    Refill:  3    Patient Instructions  Medication Instructions:  Increase Lisinopril  20mg  a day Start Zetia  10mg  daily Continue all current medications *If you need a refill on your cardiac medications before your next appointment, please call your pharmacy*  Lab Work: Bmet in one week If you have labs (blood work) drawn today and your tests are completely normal, you will receive your results only by: MyChart Message (if you have MyChart) OR A paper copy in the mail If you have any lab test that is abnormal or we need to change your treatment, we will call you to review the results.  Testing/Procedures: Zio  ZIO XT- Long Term Monitor Instructions  Your physician has requested you wear a ZIO patch monitor for 7  days.  This is a single patch monitor. [employer] supplies one patch monitor per enrollment. Additional stickers are not available. Please do not apply patch if you will be having a Nuclear Stress Test,  Echocardiogram, Cardiac CT, MRI, or Chest Xray during the period you would be wearing the  monitor. The patch cannot be worn during these tests. You cannot remove and re-apply the  ZIO XT  patch monitor.  Your ZIO patch monitor will be mailed 3 day USPS to your address on file. It may take 3-5 days  to receive your monitor after you have been enrolled.  Once you have received your monitor, please review the enclosed instructions. Your monitor  has already been registered assigning a specific monitor serial # to you.  Billing and Patient Assistance Program Information  We have supplied [employer] with any of your insurance information on file for billing purposes. [employer] offers a sliding scale Patient Assistance Program for patients that do not have  insurance, or whose insurance does not completely cover the cost of the ZIO monitor.  You must apply for the Patient Assistance Program to qualify for this discounted rate.  To apply, please call [employer] at [phone], select option 4, select option 2, ask to apply for  Patient Assistance Program. Meredeth will ask your household income, and how many people  are in your household. They will quote your out-of-pocket cost based on that information.  [employer] will also be able to set up a 5-month, interest-free payment plan if needed.  Applying the monitor   Shave hair from upper left chest.  Hold abrader disc by orange tab. Rub abrader in 40 strokes over the upper left chest as  indicated in your monitor instructions.  Clean area with 4 enclosed alcohol pads. Let dry.  Apply patch as indicated in monitor instructions. Patch will be placed under collarbone on left  side of chest with arrow pointing upward.  Rub patch adhesive wings for 2 minutes.  Remove white label marked 1. Remove the white  label marked 2. Rub patch adhesive wings for 2 additional minutes.  While looking in a mirror, press and release button in center of patch. A small green light will  flash 3-4 times. This will be your only indicator that the monitor has been turned on.  Do not shower for the first 24 hours. You may shower after the first 24 hours.  Press the button if you feel a symptom. You will hear a small click. Record Date, Time and  Symptom in the Patient Logbook.  When you are ready to remove the patch, follow instructions on the last 2 pages of Patient  Logbook. Stick patch monitor onto the last page of Patient Logbook.  Place Patient Logbook in the blue and white box. Use locking tab on box and tape box closed  securely. The blue and white box has prepaid postage on it. Please place it in the mailbox as  soon as possible. Your physician should have your test results approximately 7 days after the  monitor has been mailed back to [facility].  Call [facility] Customer Care at [phone] if you have questions regarding  your ZIO XT patch monitor. Call them immediately if you see an orange light blinking on your  monitor.  If your monitor falls off in less than 4 days, contact our Monitor department at [phone].  If your monitor becomes loose or falls off after 4 days call [employer] at [phone] for  suggestions on securing your monitor   Follow-Up: At [facility], you and your health needs are our priority.  As part of our continuing mission to provide you with exceptional heart care, our providers are all part of one team.  This team includes your primary Cardiologist (physician) and Advanced Practice Providers or APPs (Physician Assistants and Nurse Practitioners) who all work together to provide you with the care you need,  when you need it.  Your next appointment:   4 month(s)  Provider:   Lonni LITTIE Nanas, MD     We recommend signing up for the patient portal called MyChart.  Sign up information is provided on this After Visit Summary.  MyChart is used to connect with patients for Virtual Visits (Telemedicine).  Patients are able to view lab/test results, encounter notes, upcoming appointments, etc.  Non-urgent messages can be sent to your provider as well.   To learn more about what you can do with MyChart, go to [email or web address].   Other Instructions none       Signed, Lonni LITTIE Nanas, MD  06/21/2024 10:01 AM    [facility]

## 2024-06-21 ENCOUNTER — Ambulatory Visit: Attending: Cardiology | Admitting: Cardiology

## 2024-06-21 ENCOUNTER — Ambulatory Visit: Attending: Cardiology

## 2024-06-21 VITALS — BP 136/72 | HR 76 | Ht 66.0 in | Wt 145.4 lb

## 2024-06-21 DIAGNOSIS — I2510 Atherosclerotic heart disease of native coronary artery without angina pectoris: Secondary | ICD-10-CM | POA: Insufficient documentation

## 2024-06-21 DIAGNOSIS — E785 Hyperlipidemia, unspecified: Secondary | ICD-10-CM | POA: Diagnosis not present

## 2024-06-21 DIAGNOSIS — R001 Bradycardia, unspecified: Secondary | ICD-10-CM

## 2024-06-21 DIAGNOSIS — I10 Essential (primary) hypertension: Secondary | ICD-10-CM | POA: Diagnosis present

## 2024-06-21 MED ORDER — LISINOPRIL 20 MG PO TABS: 20.0000 mg | ORAL_TABLET | Freq: Every day | ORAL | 3 refills | Status: DC

## 2024-06-21 MED ORDER — EZETIMIBE 10 MG PO TABS: 10.0000 mg | ORAL_TABLET | Freq: Every day | ORAL | 3 refills | Status: AC

## 2024-06-21 MED ORDER — LISINOPRIL 20 MG PO TABS
20.0000 mg | ORAL_TABLET | Freq: Every day | ORAL | 3 refills | Status: AC
Start: 2024-06-21 — End: ?

## 2024-06-21 MED ORDER — EZETIMIBE 10 MG PO TABS: 10.0000 mg | ORAL_TABLET | Freq: Every day | ORAL | 3 refills | Status: DC

## 2024-06-21 NOTE — Progress Notes (Unsigned)
 Enrolled for [employer] to mail a ZIO XT long term holter monitor to the patients address on file.   Requested monitor to be mailed 06/28/2024.

## 2024-06-21 NOTE — Patient Instructions (Signed)
 Medication Instructions:  Increase Lisinopril  20mg  a day Start Zetia  10mg  daily Continue all current medications *If you need a refill on your cardiac medications before your next appointment, please call your pharmacy*  Lab Work: Bmet in one week If you have labs (blood work) drawn today and your tests are completely normal, you will receive your results only by: MyChart Message (if you have MyChart) OR A paper copy in the mail If you have any lab test that is abnormal or we need to change your treatment, we will call you to review the results.  Testing/Procedures: Zio  ZIO XT- Long Term Monitor Instructions  Your physician has requested you wear a ZIO patch monitor for 7 days.  This is a single patch monitor. [employer] supplies one patch monitor per enrollment. Additional stickers are not available. Please do not apply patch if you will be having a Nuclear Stress Test,  Echocardiogram, Cardiac CT, MRI, or Chest Xray during the period you would be wearing the  monitor. The patch cannot be worn during these tests. You cannot remove and re-apply the  ZIO XT patch monitor.  Your ZIO patch monitor will be mailed 3 day USPS to your address on file. It may take 3-5 days  to receive your monitor after you have been enrolled.  Once you have received your monitor, please review the enclosed instructions. Your monitor  has already been registered assigning a specific monitor serial # to you.  Billing and Patient Assistance Program Information  We have supplied [employer] with any of your insurance information on file for billing purposes. [employer] offers a sliding scale Patient Assistance Program for patients that do not have  insurance, or whose insurance does not completely cover the cost of the ZIO monitor.  You must apply for the Patient Assistance Program to qualify for this discounted rate.  To apply, please call [employer] at [phone], select option 4, select option 2, ask to apply for   Patient Assistance Program. Meredeth will ask your household income, and how many people  are in your household. They will quote your out-of-pocket cost based on that information.  [employer] will also be able to set up a 10-month, interest-free payment plan if needed.  Applying the monitor   Shave hair from upper left chest.  Hold abrader disc by orange tab. Rub abrader in 40 strokes over the upper left chest as  indicated in your monitor instructions.  Clean area with 4 enclosed alcohol pads. Let dry.  Apply patch as indicated in monitor instructions. Patch will be placed under collarbone on left  side of chest with arrow pointing upward.  Rub patch adhesive wings for 2 minutes. Remove white label marked 1. Remove the white  label marked 2. Rub patch adhesive wings for 2 additional minutes.  While looking in a mirror, press and release button in center of patch. A small green light will  flash 3-4 times. This will be your only indicator that the monitor has been turned on.  Do not shower for the first 24 hours. You may shower after the first 24 hours.  Press the button if you feel a symptom. You will hear a small click. Record Date, Time and  Symptom in the Patient Logbook.  When you are ready to remove the patch, follow instructions on the last 2 pages of Patient  Logbook. Stick patch monitor onto the last page of Patient Logbook.  Place Patient Logbook in the blue and white box. Use locking tab  on box and tape box closed  securely. The blue and white box has prepaid postage on it. Please place it in the mailbox as  soon as possible. Your physician should have your test results approximately 7 days after the  monitor has been mailed back to [facility].  Call [facility] Customer Care at [phone] if you have questions regarding  your ZIO XT patch monitor. Call them immediately if you see an orange light blinking on your  monitor.  If your monitor falls off in less than 4  days, contact our Monitor department at [phone].  If your monitor becomes loose or falls off after 4 days call [employer] at [phone] for  suggestions on securing your monitor   Follow-Up: At [facility], you and your health needs are our priority.  As part of our continuing mission to provide you with exceptional heart care, our providers are all part of one team.  This team includes your primary Cardiologist (physician) and Advanced Practice Providers or APPs (Physician Assistants and Nurse Practitioners) who all work together to provide you with the care you need, when you need it.  Your next appointment:   4 month(s)  Provider:   Lonni LITTIE Nanas, MD    We recommend signing up for the patient portal called MyChart.  Sign up information is provided on this After Visit Summary.  MyChart is used to connect with patients for Virtual Visits (Telemedicine).  Patients are able to view lab/test results, encounter notes, upcoming appointments, etc.  Non-urgent messages can be sent to your provider as well.   To learn more about what you can do with MyChart, go to [email or web address].   Other Instructions none

## 2024-06-29 ENCOUNTER — Ambulatory Visit: Payer: Self-pay | Admitting: Cardiology

## 2024-06-29 LAB — BASIC METABOLIC PANEL WITH GFR
BUN/Creatinine Ratio: 18 (ref 12–28)
BUN: 15 mg/dL (ref 8–27)
CO2: 19 mmol/L — ABNORMAL LOW (ref 20–29)
Calcium: 9.1 mg/dL (ref 8.7–10.3)
Chloride: 103 mmol/L (ref 96–106)
Creatinine, Ser: 0.85 mg/dL (ref 0.57–1.00)
Glucose: 88 mg/dL (ref 70–99)
Potassium: 4.3 mmol/L (ref 3.5–5.2)
Sodium: 137 mmol/L (ref 134–144)
eGFR: 72 mL/min/1.73 (ref >59)

## 2024-07-02 ENCOUNTER — Encounter: Payer: Self-pay | Admitting: Internal Medicine

## 2024-07-02 ENCOUNTER — Telehealth: Payer: Self-pay

## 2024-07-02 ENCOUNTER — Ambulatory Visit (INDEPENDENT_AMBULATORY_CARE_PROVIDER_SITE_OTHER): Payer: Medicare Other | Admitting: Internal Medicine

## 2024-07-02 VITALS — BP 120/80 | HR 54 | Ht 66.0 in | Wt 143.4 lb

## 2024-07-02 DIAGNOSIS — M810 Age-related osteoporosis without current pathological fracture: Secondary | ICD-10-CM

## 2024-07-02 DIAGNOSIS — E042 Nontoxic multinodular goiter: Secondary | ICD-10-CM | POA: Diagnosis not present

## 2024-07-02 NOTE — Telephone Encounter (Signed)
 Dr. Sam, patient will be scheduled as soon as possible.  Auth Submission: NO AUTH NEEDED Site of care: Site of care: [facility] Payer: Medicare A/B with BCBS supplement Medication & CPT/J Code(s) submitted: Reclast  (Zolendronic acid) J3489 Diagnosis Code:  Route of submission (phone, fax, portal):  Phone # Fax # Auth type: Buy/Bill PB Units/visits requested: 5mg  x 1 dose Reference number:  Approval from: 07/02/24 to 12/04/24

## 2024-07-02 NOTE — Progress Notes (Signed)
 Name: Holly Russell  MRN/ DOB: [id number], 22-Feb-1951    Age/ Sex: 73 y.o., female    PCP: Wendolyn Jenkins Jansky, MD   Reason for Endocrinology Evaluation: Osteoporosis     Date of Initial Endocrinology Evaluation: 07/04/2023    HPI: Ms. Holly Russell is a 73 y.o. female with a past medical history of HTN and GERD, dyslipidemia. The patient presented for initial endocrinology clinic visit on 07/04/2023 for consultative assistance with her osteoporosis.   Pt was diagnosed with osteoporosis: 04/2022 with a T-score of -2.7 at the [facility]   Menarche at age : 71 Menopausal at age :  ~50 Fracture Hx: arm fracture as a kid , right foot fracture by missing a step  Hx of HRT: no FH of osteoporosis or hip fracture: mother with hip fracture  Prior Hx of anti-resorptive therapy : Received first zoledronic  acid injection 08/06/2023  THYROID  HISTORY:  She had an incidental finding of a left 2.2 cm thyroid  nodule on CT imaging of the chest during evaluation for lung nodules. Thyroid  ultrasound showed multiple thyroid  nodules, 2 nodules meeting FNA criteria.  She is s/p FNA of the right inferior and left inferior nodules 12/2023 with a cytology report consistent with atypia of undetermined significance (Bethesda category III).  Both Afirma came back benign  SUBJECTIVE:    Today (07/02/24):  Holly Russell is here for follow-up on osteoporosis and multinodular goiter  Patient follows with cardiology for bradycardia, CAD and dyslipidemia No local neck swelling  No palpitations   Continues with occasional  heartburn , improving with Famotidine   Denies diarrhea but is prone to constipation depending on how much supplements she takes    She recently switched to calcium  fruit flavored 2 gummies = 500 mg  She tries to consume calcium  through food as well  Continues with  walks as a form of exercise     HISTORY:  Past Medical History:  Past Medical History:  Diagnosis Date   Acid reflux     Agatston CAC score 100-199    Basal cell carcinoma    Cataract    Coronary atherosclerosis    Deviated nasal septum    Eczema    Gastroesophageal reflux disease    H/O bone density study 2019-2015   H/O echocardiogram 2015   H/O exercise stress test 3/4-5 2015   Hearing loss    High blood pressure    High cholesterol    History of chest pain    History of prediabetes    Hx of mammogram 2018-2017-2016-2015-2014   Hyperlipidemia    IBS (irritable bowel syndrome)    Intervertebral disc disease    Iron deficiency anemia    On statin therapy    Osteoarthritis    Osteopenia    unspecified  Location   SNHL (sensorineural hearing loss)    Spinal stenosis    Tinnitus of left ear    Vitamin D  deficiency    Past Surgical History:  Past Surgical History:  Procedure Laterality Date   ENDOMETRIAL BIOPSY     EYE SURGERY     lazy eye when 73yo   MOHS SURGERY     for basal cell to the right of right eye   TONSILLECTOMY      Social History:  reports that she has never smoked. She has never used smokeless tobacco. She reports current alcohol use of about 3.0 standard drinks of alcohol per week. She reports that she does not use drugs.  Family History: family history includes Basal cell carcinoma in her father and mother; Breast cancer in her cousin and paternal aunt; Cancer in her maternal grandfather and paternal aunt; Depression in her paternal grandmother; Heart attack in her paternal grandfather; Heart disease in her paternal grandfather; Hypertension in her father and mother; Stroke (age of onset: 44) in her mother; Varicose Veins in her father.   HOME MEDICATIONS: Allergies as of 07/02/2024   No Known Allergies      Medication List        Accurate as of July 02, 2024 10:21 AM. If you have any questions, ask your nurse or doctor.          aspirin EC 81 MG tablet Take 81 mg by mouth every Monday, Wednesday, and Friday. Swallow whole.   atorvastatin  80 MG tablet Commonly  known as: LIPITOR Take 1 tablet (80 mg total) by mouth daily.   CALTRATE 600+D3 PO Take 600 mLs by mouth daily. 1-2 tablets daily   cyanocobalamin  1000 MCG tablet Commonly known as: VITAMIN B12 Take 1,000 mcg by mouth as needed.   ezetimibe  10 MG tablet Commonly known as: ZETIA  Take 1 tablet (10 mg total) by mouth daily.   famotidine  20 MG tablet Commonly known as: PEPCID  Take 1 tablet (20 mg total) by mouth 2 (two) times daily.   lisinopril  20 MG tablet Commonly known as: ZESTRIL  Take 1 tablet (20 mg total) by mouth daily.   triamcinolone  ointment 0.1 % Commonly known as: KENALOG 1 Application.   zoledronic  acid 5 MG/100ML Soln injection Commonly known as: RECLAST           REVIEW OF SYSTEMS: A comprehensive ROS was conducted with the patient and is negative except as per HPI    OBJECTIVE:  VS: BP 120/80 (BP Location: Left Arm, Patient Position: Sitting, Cuff Size: Normal)   Pulse (!) 54   Ht 5' 6 (1.676 m)   Wt 143 lb 6.4 oz (65 kg)   SpO2 99%   BMI 23.15 kg/m    Wt Readings from Last 3 Encounters:  07/02/24 143 lb 6.4 oz (65 kg)  06/21/24 145 lb 6.4 oz (66 kg)  05/17/24 140 lb (63.5 kg)     EXAM: General: Pt appears well and is in NAD  Neck: General: Supple without adenopathy. Thyroid : Thyroid  size normal.  No goiter or nodules appreciated. No thyroid  bruit.  Lungs: Clear with good BS bilat   Heart: Auscultation: RRR.  Abdomen: Soft, nontender  Extremities:  BL LE: No pretibial edema   Mental Status: Judgment, insight: Intact Orientation: Oriented to time, place, and person Mood and affect: No depression, anxiety, or agitation     DATA REVIEWED:    Latest Reference Range & Units 06/28/24 08:57  Sodium 134 - 144 mmol/L 137  Potassium 3.5 - 5.2 mmol/L 4.3  Chloride 96 - 106 mmol/L 103  CO2 20 - 29 mmol/L 19 (L)  Glucose 70 - 99 mg/dL 88  BUN 8 - 27 mg/dL 15  Creatinine 9.42 - 8.99 mg/dL 9.14  Calcium  8.7 - 10.3 mg/dL 9.1   BUN/Creatinine Ratio 12 - 28  18  eGFR >59 mL/min/1.73 72  (L): Data is abnormally low     Thyroid  ultrasound 12/11/2023   Estimated total number of nodules >/= 1 cm: 2   Number of spongiform nodules >/=  2 cm not described below (TR1): 0   Number of mixed cystic and solid nodules >/= 1.5 cm not described below (TR2): 0  _________________________________________________________   Nodule # 1:   Location: Right; inferior   Maximum size: 2.9 cm; Other 2 dimensions: 2.3 x 2.9 cm   Composition: solid/almost completely solid (2)   Echogenicity: isoechoic (1)   Shape: taller-than-wide (3)   Margins: smooth (0)   Echogenic foci: none (0)   ACR TI-RADS total points: 6.   ACR TI-RADS risk category: TR4 (4-6 points).   ACR TI-RADS recommendations:   **Given size (>/= 1.5 cm) and appearance, fine needle aspiration of this moderately suspicious nodule should be considered based on TI-RADS criteria.   _________________________________________________________   Nodule # 2:   Location: Left; inferior   Maximum size: 3.7 cm; Other 2 dimensions: 2.5 x 3.1 cm   Composition: solid/almost completely solid (2)   Echogenicity: isoechoic (1)   Shape: not taller-than-wide (0)   Margins: smooth (0)   Echogenic foci: none (0)   ACR TI-RADS total points: 3.   ACR TI-RADS risk category: TR3 (3 points).   ACR TI-RADS recommendations:   **Given size (>/= 2.5 cm) and appearance, fine needle aspiration of this mildly suspicious nodule should be considered based on TI-RADS criteria.   This nodule corresponds to the abnormality seen on recent chest CT.   IMPRESSION: Bilateral solid isoechoic thyroid  nodules both of which meet criteria for FNA.  FNA of right inferior nodule 12/23/2023    Clinical History: Nodule #1: Right; Inferior, Maximum size: 2.9 cm; Other 2 dimensions: 2.3 x 2.9 cm, solid/almost completely solid, isoechoic, taller-than-wide, TI-RADS total points:  6. Specimen Submitted:  A. THYROID , RT INFERIOR, FINE NEEDLE ASPIRATION   FINAL MICROSCOPIC DIAGNOSIS: - Follicular lesion of undetermined significance (Bethesda category III)   Afirma benign   FNA left inferior nodule 12/23/2023    Clinical History: Nodule #2: Left; Inferior, Maximum size: 3.7 cm; Other 2 dimensions: 2.5 x 3.1 cm, solid/almost completely solid, isoechoic, TI-RADS total points: 3. Specimen Submitted:  A. THYROID , LT INFERIOR, FINE NEEDLE ASPIRATION   FINAL MICROSCOPIC DIAGNOSIS: - Follicular lesion of undetermined significance (Bethesda category III)   Afirma benign   DXA 04/05/2022 The BMD measured at [facility] L1-L4 is 0.857 g/cm2 with a T-score of -2.7. This patient is considered osteoporotic according to World Health Organization [facility]) criteria.   The quality of the exam is good.   Site Region Measured Date Measured Age YA BMD Significant CHANGE T-score [facility] L1-L4 04/05/2022 72.2 -2.7 0.857 g/cm2   DualFemur Neck Left 04/05/2022 72.2 -2.0 0.767 g/cm2   DualFemur Total Mean 04/05/2022 72.2 -1.5 0.814 g/cm2   Right Forearm Radius 33% 04/05/2022 72.2 -1.6 0.737 g/cm2  Old records , labs and images have been reviewed.    ASSESSMENT/PLAN/RECOMMENDATIONS:   Osteoporosis:  -Emphasized the importance of calcium  and vitamin D  intake as well as weightbearing exercises -She does consume dietary calcium  and supplements with calcium  Gummies when needed -Due to GERD patient would not be a candidate for oral bisphosphonate therapy -She received zoledronic  acid 08/2023 without side effects, I have placed the order for the second dose -We discussed considering a drug holiday at the 3-year mark of zoledronic  acid -Most recent labs reviewed - I have asked her to hold off on repeating DXA scan at this time  Medications : Zoledronic  acid 5 mg IV annually   2.  Multinodular goiter:  -No local neck symptoms -TFTs normal in the past -S/p FNA of the right  and left inferior nodules with atypia of undetermined significance (Bethesda categoryIII), both the fragments were benign - Will  repeat ultrasound by next visit   Follow-up in 6 months    Signed electronically by: Stefano Redgie Butts, MD  [facility] Endocrinology  [facility] [address] [place] [place], [place] [address] Phone: [phone] FAX: [phone]   CC: Wendolyn Jenkins Jansky, MD [address] [place] [address] Phone: [phone] Fax: [phone]   Return to Endocrinology clinic as below: Future Appointments  Date Time Provider Department Center  10/05/2024 10:00 AM Kate Lonni CROME, MD [facility]  11/17/2024  9:00 AM Wendolyn Jenkins Jansky, MD [facility]  04/25/2025 11:20 AM [facility] VISIT 1 [facility]

## 2024-07-07 ENCOUNTER — Encounter: Payer: Self-pay | Admitting: Cardiology

## 2024-07-07 ENCOUNTER — Encounter: Payer: Self-pay | Admitting: Family Medicine

## 2024-07-08 ENCOUNTER — Other Ambulatory Visit: Payer: Self-pay | Admitting: *Deleted

## 2024-07-08 DIAGNOSIS — Z23 Encounter for immunization: Secondary | ICD-10-CM

## 2024-07-08 MED ORDER — COVID-19 MRNA VACC (MODERNA) 50 MCG/0.5ML IM SUSP: 0.5000 mL | Freq: Once | INTRAMUSCULAR | 0 refills | Status: AC

## 2024-07-08 NOTE — Addendum Note (Signed)
 Addended by: JOSHUA JOEN RAMAN on: 07/08/2024 11:13 AM   Modules accepted: Orders

## 2024-08-10 ENCOUNTER — Ambulatory Visit (INDEPENDENT_AMBULATORY_CARE_PROVIDER_SITE_OTHER)

## 2024-08-10 VITALS — BP 131/77 | HR 48 | Temp 97.8°F | Resp 18 | Ht 65.0 in | Wt 145.4 lb

## 2024-08-10 DIAGNOSIS — M810 Age-related osteoporosis without current pathological fracture: Secondary | ICD-10-CM | POA: Diagnosis not present

## 2024-08-10 MED ORDER — SODIUM CHLORIDE 0.9 % IV SOLN: INTRAVENOUS | Status: DC

## 2024-08-10 MED ORDER — DIPHENHYDRAMINE HCL 25 MG PO CAPS
25.0000 mg | ORAL_CAPSULE | Freq: Once | ORAL | Status: AC
  Administered 2024-08-10: 25 mg via ORAL
  Filled 2024-08-10: qty 1

## 2024-08-10 MED ORDER — ZOLEDRONIC ACID 5 MG/100ML IV SOLN
5.0000 mg | Freq: Once | INTRAVENOUS | Status: AC
  Administered 2024-08-10: 5 mg via INTRAVENOUS
  Filled 2024-08-10: qty 100

## 2024-08-10 MED ORDER — ACETAMINOPHEN 325 MG PO TABS
650.0000 mg | ORAL_TABLET | Freq: Once | ORAL | Status: AC
  Administered 2024-08-10: 650 mg via ORAL
  Filled 2024-08-10: qty 2

## 2024-08-10 NOTE — Patient Instructions (Signed)

## 2024-08-10 NOTE — Progress Notes (Signed)
 Diagnosis: Osteoporosis  Provider:  Lonna Coder MD  Procedure: IV Infusion  IV Type: Peripheral, IV Location: R Antecubital  Reclast  (Zolendronic Acid), Dose: 5 mg  Infusion Start Time: 1014  Infusion Stop Time: 1044  Post Infusion IV Care: Observation period completed and Peripheral IV Discontinued  Discharge: Condition: Good, Destination: Home . AVS Provided  Performed by:  Maximiano JONELLE Pouch, LPN

## 2024-08-15 DIAGNOSIS — R001 Bradycardia, unspecified: Secondary | ICD-10-CM | POA: Diagnosis not present

## 2024-09-22 ENCOUNTER — Other Ambulatory Visit: Payer: Self-pay

## 2024-09-22 DIAGNOSIS — E7800 Pure hypercholesterolemia, unspecified: Secondary | ICD-10-CM

## 2024-09-23 ENCOUNTER — Ambulatory Visit: Payer: Self-pay | Admitting: Cardiology

## 2024-09-23 LAB — LIPID PANEL
Chol/HDL Ratio: 1.6 ratio (ref 0.0–4.4)
Cholesterol, Total: 211 mg/dL — ABNORMAL HIGH (ref 100–199)
HDL: 133 mg/dL (ref >39)
LDL Chol Calc (NIH): 70 mg/dL (ref 0–99)
Triglycerides: 42 mg/dL (ref 0–149)
VLDL Cholesterol Cal: 8 mg/dL (ref 5–40)

## 2024-10-03 NOTE — Progress Notes (Unsigned)
 Cardiology Office Note:    Date:  10/07/2024   ID:  DARWIN GUASTELLA, DOB 05/26/1951, MRN [id number]  PCP:  Wendolyn Jenkins Jansky, MD  Cardiologist:  Lonni LITTIE Nanas, MD  Electrophysiologist:  None   Referring MD: Wendolyn Jenkins Jansky, MD   Chief Complaint  Patient presents with   Coronary Artery Disease    History of Present Illness:    Holly Russell is a 73 y.o. female with a hx of CAD, hypertension, hyperlipidemia, prediabetes, GERD who presents for follow-up.  She was referred by Dr Wendolyn for evaluation of CAD, initially seen 02/12/2024.  CT chest 11/2023 noted to have coronary calcifications.   She reports she previously followed with cardiologist in [place] for her hyperlipidemia.  Did report atypical chest pain.  Echocardiogram 03/25/2024 showed normal biventricular function, mild mitral regurgitation, mild biatrial enlargement.  Stress PET 05/11/2024 showed normal perfusion, LVEF 63%, normal myocardial blood flow reserve, moderate coronary calcifications.  Zio patch x 9 days 06/2024 showed 1 episode of SVT lasting 16 beats, low resting heart rate (average 51 bpm), no significant arrhythmias.  Since last clinic visit, she reports she is doing well.  Reports some lightheadedness but denies any syncope.  Does report some chest pain but states appears related to what she eats.  She denies any shortness of breath.  She is walking for about 3 to 7 hours/week.   Past Medical History:  Diagnosis Date   Acid reflux    Agatston CAC score 100-199    Basal cell carcinoma    Cataract    Coronary atherosclerosis    Deviated nasal septum    Eczema    Gastroesophageal reflux disease    H/O bone density study 2019-2015   H/O echocardiogram 2015   H/O exercise stress test 3/4-5 2015   Hearing loss    High blood pressure    High cholesterol    History of chest pain    History of prediabetes    Hx of mammogram 2018-2017-2016-2015-2014   Hyperlipidemia    IBS (irritable bowel syndrome)     Intervertebral disc disease    Iron deficiency anemia    On statin therapy    Osteoarthritis    Osteopenia    unspecified  Location   SNHL (sensorineural hearing loss)    Spinal stenosis    Tinnitus of left ear    Vitamin D  deficiency     Past Surgical History:  Procedure Laterality Date   ENDOMETRIAL BIOPSY     EYE SURGERY     lazy eye when 73yo   MOHS SURGERY     for basal cell to the right of right eye   TONSILLECTOMY      Current Medications: Current Meds  Medication Sig   aspirin EC 81 MG tablet Take 81 mg by mouth every Monday, Wednesday, and Friday. Swallow whole.   atorvastatin  (LIPITOR) 80 MG tablet Take 1 tablet (80 mg total) by mouth daily.   Calcium  Carb-Cholecalciferol (CALTRATE 600+D3 PO) Take 600 mLs by mouth daily. 1-2 tablets daily   ezetimibe  (ZETIA ) 10 MG tablet Take 1 tablet (10 mg total) by mouth daily.   famotidine  (PEPCID ) 20 MG tablet Take 1 tablet (20 mg total) by mouth 2 (two) times daily.   lisinopril  (ZESTRIL ) 20 MG tablet Take 1 tablet (20 mg total) by mouth daily.   vitamin B-12 (CYANOCOBALAMIN ) 1000 MCG tablet Take 1,000 mcg by mouth as needed.   zoledronic  acid (RECLAST ) 5 MG/100ML SOLN injection  Allergies:   Patient has no known allergies.   Social History   Socioeconomic History   Marital status: Married    Spouse name: Not on file   Number of children: 2   Years of education: Not on file   Highest education level: Professional school degree (e.g., MD, DDS, DVM, JD)  Occupational History   Not on file  Tobacco Use   Smoking status: Never   Smokeless tobacco: Never  Vaping Use   Vaping status: Never Used  Substance and Sexual Activity   Alcohol use: Yes    Alcohol/week: 3.0 standard drinks of alcohol    Types: 3 Glasses of wine per week    Comment: 2-3/wk   Drug use: Never   Sexual activity: Not Currently    Partners: Male    Birth control/protection: Post-menopausal  Other Topics Concern   Not on file  Social History  Narrative   Diet: Plant Based and Fish      Do you drink/ eat things with caffeine ?  Yes      Marital status:  Married       2 children, 1 grand            What year were you married ? 1976      Do you live in a house, apartment,assistred living, condo, trailer, etc.)? Condo-Town House      Is it one or more stories? 2      How many persons live in your home ? 2      Do you have any pets in your home ?(please list) No      Highest Level of education completed: Law School      Current or past profession: Attorney      Do you exercise?     Yes                         Type & how often Walk-3 miles daily      ADVANCED DIRECTIVES (Please bring copies)      Do you have a living will? Yes      Do you have a DNR form?   Yes                    If not, do you want to discuss one?       Do you have signed POA?HPOA forms?  Yes               If so, please bring to your appointment      FUNCTIONAL STATUS- To be completed by Spouse / child / Staff       Do you have difficulty bathing or dressing yourself ?  No      Do you have difficulty preparing food or eating ?  No      Do you have difficulty managing your mediation ?  No      Do you have difficulty managing your finances ?  No      Do you have difficulty affording your medication ?  No      Social Drivers of Corporate Investment Banker Strain: Low Risk  (04/16/2024)   Overall Financial Resource Strain (CARDIA)    Difficulty of Paying Living Expenses: Not hard at all  Food Insecurity: No Food Insecurity (04/16/2024)   Hunger Vital Sign    Worried About Running Out of Food in the Last Year: Never true    Ran  Out of Food in the Last Year: Never true  Transportation Needs: No Transportation Needs (04/16/2024)   PRAPARE - Administrator, Civil Service (Medical): No    Lack of Transportation (Non-Medical): No  Physical Activity: Sufficiently Active (04/16/2024)   Exercise Vital Sign    Days of Exercise per Week: 6 days     Minutes of Exercise per Session: 60 min  Stress: Patient Declined (04/16/2024)   [employer] of Occupational Health - Occupational Stress Questionnaire    Feeling of Stress: Patient declined  Social Connections: Unknown (04/16/2024)   Social Connection and Isolation Panel    Frequency of Communication with Friends and Family: Patient declined    Frequency of Social Gatherings with Friends and Family: Patient declined    Attends Religious Services: Patient declined    Database Administrator or Organizations: Patient declined    Attends Engineer, Structural: Not on file    Marital Status: Married     Family History: The patient's family history includes Basal cell carcinoma in her father and mother; Breast cancer in her cousin and paternal aunt; Cancer in her maternal grandfather and paternal aunt; Depression in her paternal grandmother; Heart attack in her paternal grandfather; Heart disease in her paternal grandfather; Hypertension in her father and mother; Stroke (age of onset: 31) in her mother; Varicose Veins in her father.  ROS:   Please see the history of present illness.     All other systems reviewed and are negative.  EKGs/Labs/Other Studies Reviewed:    The following studies were reviewed today:   EKG:   02/12/2024: Sinus bradycardia, rate 55, no ST abnormalities  Recent Labs: 11/17/2023: Hemoglobin 13.4; Platelets 217.0 04/16/2024: ALT 27 06/28/2024: BUN 15; Creatinine, Ser 0.85; Potassium 4.3; Sodium 137  Recent Lipid Panel    Component Value Date/Time   CHOL 211 (H) 09/22/2024 1358   TRIG 42 09/22/2024 1358   HDL 133 09/22/2024 1358   CHOLHDL 1.6 09/22/2024 1358   CHOLHDL 2 11/17/2023 0935   VLDL 8.8 11/17/2023 0935   LDLCALC 70 09/22/2024 1358   LDLCALC 112 (H) 04/29/2022 0813    Physical Exam:    VS:  BP (!) 153/67 (BP Location: Left Arm, Patient Position: Sitting)   Pulse (!) 54   Ht 5' 5 (1.651 m)   Wt 146 lb (66.2 kg)   SpO2 95%    BMI 24.30 kg/m     Wt Readings from Last 3 Encounters:  10/05/24 146 lb (66.2 kg)  08/10/24 145 lb 6.4 oz (66 kg)  07/02/24 143 lb 6.4 oz (65 kg)     GEN:  WeShe w nourished, well developed in no acute distress HEENT: Normal NECK: No JVD; No carotid bruits LYMPHATICS: No lymphadenopathy CARDIAC: RRR, 2/6 systolic murmur RESPIRATORY:  Clear to auscultation without rales, wheezing or rhonchi  ABDOMEN: Soft, non-tender, non-distended MUSCULOSKELETAL:  No edema; No deformity  SKIN: Warm and dry NEUROLOGIC:  Alert and oriented x 3 PSYCHIATRIC:  Normal affect   ASSESSMENT:    1. Pre-op evaluation   2. Coronary artery disease involving native coronary artery of native heart without angina pectoris   3. Essential (primary) hypertension   4. Bradycardia   5. Hyperlipidemia, unspecified hyperlipidemia type      PLAN:    Preop evaluation: Prior to colonoscopy.  Low risk procedure.  Recent stress PET showed normal perfusion.  No further workup recommended prior to procedure.  CAD: Reported atypical chest pain.  Echocardiogram 03/25/2024 showed  normal biventricular function, mild mitral regurgitation, mild biatrial enlargement.  Stress PET 05/11/2024 showed normal perfusion, LVEF 63%, normal myocardial blood flow reserve, moderate coronary calcifications.   - Continue atorvastatin  80 mg daily and Zetia  10 mg daily - Chest pain improved with increasing famotidine , suspect likely GERD  Hypertension: on lisinopril  20 mg daily.  BP elevated in clinic today, asked to check BP twice daily for next week and let us  know results  Hyperlipidemia: on atorvastatin  40 mg daily.  LDL 127 on 11/17/2023.  Given significant coronary calcifications on CT chest 11/2023, increase atorvastatin  80 mg daily.  LDL 87 on 06/16/2024.  Added Zetia  10 mg daily, LDL 70 on 09/22/2024  Bradycardia: Has noted heart rate down to 40s when checks at home. Zio patch x 9 days 06/2024 showed 1 episode of SVT lasting 16 beats, low  resting heart rate (average 51 bpm), no significant arrhythmias.  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  Concerns regarding medicines are outlined above.  No orders of the defined types were placed in this encounter.  No orders of the defined types were placed in this encounter.   Patient Instructions  Medication Instructions:  Your physician recommends that you continue on your current medications as directed. Please refer to the Current Medication list given to you today.  *If you need a refill on your cardiac medications before your next appointment, please call your pharmacy*  Lab Work: none If you have labs (blood work) drawn today and your tests are completely normal, you will receive your results only by: MyChart Message (if you have MyChart) OR A paper copy in the mail If you have any lab test that is abnormal or we need to change your treatment, we will call you to review the results.  Testing/Procedures: none  Follow-Up: At [facility], you and your health needs are our priority.  As part of our continuing mission to provide you with exceptional heart care, our providers are all part of one team.  This team includes your primary Cardiologist (physician) and Advanced Practice Providers or APPs (Physician Assistants and Nurse Practitioners) who all work together to provide you with the care you need, when you need it.  Your next appointment:   6 months  Provider:   Dr. KATE   We recommend signing up for the patient portal called MyChart.  Sign up information is provided on this After Visit Summary.  MyChart is used to connect with patients for Virtual Visits (Telemedicine).  Patients are able to view lab/test results, encounter notes, upcoming appointments, etc.   Non-urgent messages can be sent to your provider as well.   To learn more about what you can do with MyChart, go to [email or web address].   Other Instructions Please check blood pressure twice a day for next week and send those reading           Signed, Lonni LITTIE Kate, MD  10/07/2024 5:28 PM    [facility]

## 2024-10-05 ENCOUNTER — Ambulatory Visit: Attending: Cardiology | Admitting: Cardiology

## 2024-10-05 ENCOUNTER — Encounter: Payer: Self-pay | Admitting: Cardiology

## 2024-10-05 VITALS — BP 153/67 | HR 54 | Ht 65.0 in | Wt 146.0 lb

## 2024-10-05 DIAGNOSIS — E785 Hyperlipidemia, unspecified: Secondary | ICD-10-CM | POA: Insufficient documentation

## 2024-10-05 DIAGNOSIS — I10 Essential (primary) hypertension: Secondary | ICD-10-CM | POA: Diagnosis present

## 2024-10-05 DIAGNOSIS — R001 Bradycardia, unspecified: Secondary | ICD-10-CM | POA: Diagnosis present

## 2024-10-05 DIAGNOSIS — Z01818 Encounter for other preprocedural examination: Secondary | ICD-10-CM | POA: Diagnosis present

## 2024-10-05 DIAGNOSIS — I2510 Atherosclerotic heart disease of native coronary artery without angina pectoris: Secondary | ICD-10-CM | POA: Insufficient documentation

## 2024-10-05 NOTE — Patient Instructions (Signed)
 Medication Instructions:  Your physician recommends that you continue on your current medications as directed. Please refer to the Current Medication list given to you today.  *If you need a refill on your cardiac medications before your next appointment, please call your pharmacy*  Lab Work: none If you have labs (blood work) drawn today and your tests are completely normal, you will receive your results only by: MyChart Message (if you have MyChart) OR A paper copy in the mail If you have any lab test that is abnormal or we need to change your treatment, we will call you to review the results.  Testing/Procedures: none  Follow-Up: At [facility], you and your health needs are our priority.  As part of our continuing mission to provide you with exceptional heart care, our providers are all part of one team.  This team includes your primary Cardiologist (physician) and Advanced Practice Providers or APPs (Physician Assistants and Nurse Practitioners) who all work together to provide you with the care you need, when you need it.  Your next appointment:   6 months  Provider:   Dr. KATE   We recommend signing up for the patient portal called MyChart.  Sign up information is provided on this After Visit Summary.  MyChart is used to connect with patients for Virtual Visits (Telemedicine).  Patients are able to view lab/test results, encounter notes, upcoming appointments, etc.  Non-urgent messages can be sent to your provider as well.   To learn more about what you can do with MyChart, go to [email or web address].   Other Instructions Please check blood pressure twice a day for next week and send those reading

## 2024-10-18 ENCOUNTER — Encounter: Payer: Self-pay | Admitting: Cardiology

## 2024-11-17 ENCOUNTER — Encounter: Payer: Self-pay | Admitting: Family Medicine

## 2024-11-17 ENCOUNTER — Ambulatory Visit (INDEPENDENT_AMBULATORY_CARE_PROVIDER_SITE_OTHER): Admitting: Family Medicine

## 2024-11-17 VITALS — BP 118/72 | HR 68 | Temp 96.4°F | Ht 60.5 in | Wt 147.2 lb

## 2024-11-17 DIAGNOSIS — K2100 Gastro-esophageal reflux disease with esophagitis, without bleeding: Secondary | ICD-10-CM | POA: Diagnosis not present

## 2024-11-17 DIAGNOSIS — I10 Essential (primary) hypertension: Secondary | ICD-10-CM

## 2024-11-17 DIAGNOSIS — E7800 Pure hypercholesterolemia, unspecified: Secondary | ICD-10-CM

## 2024-11-17 DIAGNOSIS — R7303 Prediabetes: Secondary | ICD-10-CM | POA: Diagnosis not present

## 2024-11-17 DIAGNOSIS — E538 Deficiency of other specified B group vitamins: Secondary | ICD-10-CM | POA: Diagnosis not present

## 2024-11-17 DIAGNOSIS — M810 Age-related osteoporosis without current pathological fracture: Secondary | ICD-10-CM

## 2024-11-17 NOTE — Patient Instructions (Signed)
 It was very nice to see you today!  Famotidine  once/day and if needed, the twice.   Do for at least 1 month.   If ok, try every other day for 1 month.    PLEASE NOTE:  If you had any lab tests please let us  know if you have not heard back within a few days. You may see your results on MyChart before we have a chance to review them but we will give you a call once they are reviewed by us . If we ordered any referrals today, please let us  know if you have not heard from their office within the next week.   Please try these tips to maintain a healthy lifestyle:  Eat most of your calories during the day when you are active. Eliminate processed foods including packaged sweets (pies, cakes, cookies), reduce intake of potatoes, white bread, white pasta, and white rice. Look for whole grain options, oat flour or almond flour.  Each meal should contain half fruits/vegetables, one quarter protein, and one quarter carbs (no bigger than a computer mouse).  Cut down on sweet beverages. This includes juice, soda, and sweet tea. Also watch fruit intake, though this is a healthier sweet option, it still contains natural sugar! Limit to 3 servings daily.  Drink at least 1 glass of water with each meal and aim for at least 8 glasses per day  Exercise at least 150 minutes every week.

## 2024-11-17 NOTE — Progress Notes (Signed)
 "  Subjective:     Patient ID: Holly Russell, female    DOB: 1951-11-03, 74 y.o.   MRN: [id number]  Chief Complaint  Patient presents with   Hypertension    Pt is here for chronic issues    Discussed the use of AI scribe software for clinical note transcription with the patient, who gave verbal consent to proceed.  History of Present Illness The patient is a 74 year old with hyperlipidemia, htn, and osteoporosis who presents for a follow-up visit.  She has recently added Zetia  to her atorvastatin  for HLD.   and received her second Reclast  infusion for osteoporosis without any adverse reactions. .  She is under the care of Dr. Kate for cardiology and has undergone several tests, including a CT perfusion study, which was normal. She has a low heart rate but no symptoms associated with it. Her atorvastatin  dose was increased to 80 mg, resulting in an LDL level of 70. She also takes lisinopril  20 mg for blood pressure, which she monitors at home, noting variable readings.  She experiences occasional heartburn and takes famotidine  bid. She has recent episodes of sharp, brief chest pains relieved by water, which she associates with heartburn.  She has a history of prediabetes and is mindful of her diet, although she admits to consuming more sugar during the holiday season. She has struggled with weight gain since the COVID-19 pandemic, despite maintaining a regular walking routine of three miles most days.  She experiences anxiety related to driving, particularly on highways, and has avoided driving in unfamiliar areas. She does not drive at night due to astigmatism causing visual disturbances. Her husband assists with driving, but her anxiety limits her independence. She has a history of panic attacks while driving on highways and has since avoided them. She is considering strategies to improve her driving confidence without medication.  She has a history of acid reflux, initially diagnosed  in 2015 after presenting with chest pain, which was ruled out as a heart attack. She has been on acid suppression therapy since then.    Health Maintenance Due  Topic Date Due   COVID-19 Vaccine (11 - 2025-26 season) 07/05/2024    Past Medical History:  Diagnosis Date   Acid reflux    Agatston CAC score 100-199    Basal cell carcinoma    Cataract    Coronary atherosclerosis    Deviated nasal septum    Eczema    Gastroesophageal reflux disease    H/O bone density study 2019-2015   H/O echocardiogram 2015   H/O exercise stress test 3/4-5 2015   Hearing loss    High blood pressure    High cholesterol    History of chest pain    History of prediabetes    Hx of mammogram 2018-2017-2016-2015-2014   Hyperlipidemia    IBS (irritable bowel syndrome)    Intervertebral disc disease    Iron deficiency anemia    On statin therapy    Osteoarthritis    Osteopenia    unspecified  Location   SNHL (sensorineural hearing loss)    Spinal stenosis    Tinnitus of left ear    Vitamin D  deficiency     Past Surgical History:  Procedure Laterality Date   ENDOMETRIAL BIOPSY     EYE SURGERY     lazy eye when 74yo   MOHS SURGERY     for basal cell to the right of right eye   TONSILLECTOMY  Current Medications[1]  Allergies[2] ROS neg/noncontributory except as noted HPI/below      Objective:     BP 118/72 (BP Location: Left Arm, Patient Position: Sitting, Cuff Size: Normal)   Pulse 68   Temp (!) 96.4 F (35.8 C) (Temporal)   Ht 5' 5 (1.651 m)   Wt 147 lb 4 oz (66.8 kg)   SpO2 98%   BMI 24.50 kg/m  Wt Readings from Last 3 Encounters:  11/17/24 147 lb 4 oz (66.8 kg)  10/05/24 146 lb (66.2 kg)  08/10/24 145 lb 6.4 oz (66 kg)    Physical Exam GENERAL: Well developed, well nourished, no acute distress. HEAD EYES EARS NOSE THROAT: Normocephalic, atraumatic, conjunctiva not injected, sclera nonicteric. CARDIAC: Regular rate and rhythm, S1 S2 present, no murmur, dorsalis  pedis 2 plus bilaterally. NECK: Supple, no thyromegaly, no nodes, no carotid bruits. LUNGS: Clear to auscultation bilaterally, no wheezes. ABDOMEN: Bowel sounds present, soft, non-tender, non-distended, no hepatosplenomegaly, no masses. EXTREMITIES: No edema. MUSCULOSKELETAL: No gross abnormalities. NEUROLOGICAL: Alert and oriented x3, cranial nerves II through XII intact. PSYCHIATRIC: Normal mood, good eye contact.       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 She prefers non-pharmacological interventions. Consider cognitive behavioral therapy for anxiety management and encourage gradual exposure to driving with her husband's guidance.  Essential hypertension   Blood pressure readings are variable, without symptoms like headache, dizziness, chest pain, or shortness of breath. She is currently on lisinopril  20 mg. Continue lisinopril  20 mg daily and monitor blood pressure regularly.  Pure hypercholesterolemia   LDL cholesterol is well-controlled at 70 mg/dL with atorvastatin  80 mg and Zetia  10 mg. HDL cholesterol is controlled now. Continue atorvastatin  80 mg daily and Zetia  10 mg daily.  Gastroesophageal reflux disease   She experiences occasional sharp pains associated with heartburn, relieved by water, and is currently on famotidine  twice daily. There are concerns about the cognitive effects of  famotidine  per pt. Reduce famotidine  to once daily and adjust based on symptoms. Monitor for breakthrough symptoms and adjust dosing as needed.  Prediabetes   Her dietary habits include high sugar intake during holidays, and she is aware of the need to manage sugar intake. Encourage dietary modifications to reduce sugar intake and continue monitoring blood glucose levels.  Age-related osteoporosis   She received her second Reclast  infusion without adverse reactions and engages in regular walking for exercise. Continue Reclast  infusions as scheduled and encourage weight-bearing exercises within safe limits.  Vitamin B12 deficiency   The current status of B12 deficiency is uncertain. She was previously advised to take B12 due to a vegetarian diet and acid suppression therapy. Check current B12 levels and consider B12 supplementation if levels are low.  General health maintenance   Her colonoscopy appointment needs rescheduling. Pap smear frequency was discussed, with a recommendation for every 2-3 years. Reschedule the  colonoscopy appointment and schedule Pap smear every 2-3 years.     Return in about 6 months (around 05/17/2025) for chronic follow-up.  Jenkins CHRISTELLA Carrel, MD     [1]  Current Outpatient Medications:    aspirin EC 81 MG tablet, Take 81 mg by mouth every Monday, Wednesday, and Friday. Swallow whole., Disp: , Rfl:    atorvastatin  (LIPITOR) 80 MG tablet, Take 1 tablet (80 mg total) by mouth daily., Disp: 90 tablet, Rfl: 3   Calcium  Carb-Cholecalciferol (CALTRATE 600+D3 PO), Take 600 mLs by mouth daily. 1-2 tablets daily, Disp: , Rfl:    ezetimibe  (ZETIA ) 10 MG tablet, Take 1 tablet (10 mg total) by mouth daily., Disp: 90 tablet, Rfl: 3   famotidine  (PEPCID ) 20 MG tablet, Take 1 tablet (20 mg total) by mouth 2 (two) times daily., Disp: 180 tablet, Rfl: 3   lisinopril  (ZESTRIL ) 20 MG tablet, Take 1 tablet (20 mg total) by mouth daily., Disp: 90 tablet, Rfl: 3   zoledronic  acid  (RECLAST ) 5 MG/100ML SOLN injection, , Disp: , Rfl:  [2] No Known Allergies  "

## 2025-01-06 ENCOUNTER — Ambulatory Visit: Admitting: Internal Medicine

## 2025-01-20 ENCOUNTER — Ambulatory Visit: Admitting: Internal Medicine

## 2025-04-25 ENCOUNTER — Ambulatory Visit

## 2025-06-01 ENCOUNTER — Ambulatory Visit: Admitting: Family Medicine
